# Patient Record
Sex: Male | Born: 1940 | Race: White | Hispanic: No | Marital: Married | Smoking: Former smoker
Health system: Southern US, Community
[De-identification: ages and names within clinical notes are randomized; demographics above are authoritative.]

## PROBLEM LIST (undated history)

## (undated) DIAGNOSIS — J302 Other seasonal allergic rhinitis: Secondary | ICD-10-CM

## (undated) DIAGNOSIS — I10 Essential (primary) hypertension: Secondary | ICD-10-CM

## (undated) DIAGNOSIS — M8580 Other specified disorders of bone density and structure, unspecified site: Secondary | ICD-10-CM

## (undated) DIAGNOSIS — E785 Hyperlipidemia, unspecified: Secondary | ICD-10-CM

## (undated) DIAGNOSIS — Z87442 Personal history of urinary calculi: Secondary | ICD-10-CM

## (undated) DIAGNOSIS — I219 Acute myocardial infarction, unspecified: Secondary | ICD-10-CM

## (undated) DIAGNOSIS — Z136 Encounter for screening for cardiovascular disorders: Secondary | ICD-10-CM

## (undated) DIAGNOSIS — M1990 Unspecified osteoarthritis, unspecified site: Secondary | ICD-10-CM

## (undated) DIAGNOSIS — I480 Paroxysmal atrial fibrillation: Secondary | ICD-10-CM

## (undated) DIAGNOSIS — I2510 Atherosclerotic heart disease of native coronary artery without angina pectoris: Secondary | ICD-10-CM

## (undated) HISTORY — DX: Other seasonal allergic rhinitis: J30.2

## (undated) HISTORY — DX: Unspecified osteoarthritis, unspecified site: M19.90

## (undated) HISTORY — DX: Other specified disorders of bone density and structure, unspecified site: M85.80

## (undated) HISTORY — DX: Essential (primary) hypertension: I10

## (undated) HISTORY — DX: Personal history of urinary calculi: Z87.442

## (undated) HISTORY — PX: CORONARY ANGIOPLASTY: SHX604

## (undated) HISTORY — DX: Atherosclerotic heart disease of native coronary artery without angina pectoris: I25.10

## (undated) HISTORY — PX: CARDIAC SURGERY: SHX584

## (undated) HISTORY — DX: Encounter for screening for cardiovascular disorders: Z13.6

## (undated) HISTORY — DX: Hyperlipidemia, unspecified: E78.5

---

## 2011-12-19 HISTORY — PX: CARDIAC SURGERY: SHX584

## 2013-02-25 LAB — CBC AND DIFFERENTIAL
HCT: 39 % — AB (ref 41–53)
HEMOGLOBIN: 13.1 g/dL — AB (ref 13.5–17.5)
Platelets: 211 10*3/uL (ref 150–399)
WBC: 9 10^3/mL

## 2013-02-25 LAB — LIPID PANEL
CHOLESTEROL: 107 mg/dL (ref 0–200)
HDL: 49 mg/dL (ref 35–70)
LDL Cholesterol: 47 mg/dL
LDL/HDL RATIO: 1
TRIGLYCERIDES: 53 mg/dL (ref 40–160)

## 2013-02-25 LAB — HEPATIC FUNCTION PANEL
ALK PHOS: 61 U/L (ref 25–125)
ALT: 14 U/L (ref 10–40)
AST: 15 U/L (ref 14–40)
Bilirubin, Total: 0.5 mg/dL

## 2013-02-25 LAB — BASIC METABOLIC PANEL
BUN: 26 mg/dL — AB (ref 4–21)
CREATININE: 1 mg/dL (ref 0.6–1.3)
Glucose: 91 mg/dL
POTASSIUM: 4.6 mmol/L (ref 3.4–5.3)
POTASSIUM: 4.6 mmol/L (ref 3.4–5.3)
SODIUM: 144 mmol/L (ref 137–147)

## 2014-01-28 DIAGNOSIS — Z9861 Coronary angioplasty status: Secondary | ICD-10-CM | POA: Diagnosis not present

## 2014-01-28 DIAGNOSIS — I2510 Atherosclerotic heart disease of native coronary artery without angina pectoris: Secondary | ICD-10-CM | POA: Diagnosis not present

## 2014-01-28 DIAGNOSIS — E785 Hyperlipidemia, unspecified: Secondary | ICD-10-CM | POA: Diagnosis not present

## 2014-01-28 DIAGNOSIS — I10 Essential (primary) hypertension: Secondary | ICD-10-CM | POA: Diagnosis not present

## 2014-05-07 DIAGNOSIS — H9120 Sudden idiopathic hearing loss, unspecified ear: Secondary | ICD-10-CM | POA: Diagnosis not present

## 2014-05-07 DIAGNOSIS — J322 Chronic ethmoidal sinusitis: Secondary | ICD-10-CM | POA: Diagnosis not present

## 2014-05-07 DIAGNOSIS — H663X9 Other chronic suppurative otitis media, unspecified ear: Secondary | ICD-10-CM | POA: Diagnosis not present

## 2014-05-07 DIAGNOSIS — H60399 Other infective otitis externa, unspecified ear: Secondary | ICD-10-CM | POA: Diagnosis not present

## 2014-05-18 DIAGNOSIS — E785 Hyperlipidemia, unspecified: Secondary | ICD-10-CM | POA: Diagnosis not present

## 2014-05-18 DIAGNOSIS — I10 Essential (primary) hypertension: Secondary | ICD-10-CM | POA: Diagnosis not present

## 2014-05-18 DIAGNOSIS — I2510 Atherosclerotic heart disease of native coronary artery without angina pectoris: Secondary | ICD-10-CM | POA: Diagnosis not present

## 2014-05-18 DIAGNOSIS — H9190 Unspecified hearing loss, unspecified ear: Secondary | ICD-10-CM | POA: Diagnosis not present

## 2014-06-21 DIAGNOSIS — H01009 Unspecified blepharitis unspecified eye, unspecified eyelid: Secondary | ICD-10-CM | POA: Diagnosis not present

## 2014-06-21 DIAGNOSIS — H109 Unspecified conjunctivitis: Secondary | ICD-10-CM | POA: Diagnosis not present

## 2014-08-11 DIAGNOSIS — H01009 Unspecified blepharitis unspecified eye, unspecified eyelid: Secondary | ICD-10-CM | POA: Diagnosis not present

## 2014-10-01 DIAGNOSIS — H16211 Exposure keratoconjunctivitis, right eye: Secondary | ICD-10-CM | POA: Diagnosis not present

## 2014-10-01 DIAGNOSIS — H5203 Hypermetropia, bilateral: Secondary | ICD-10-CM | POA: Diagnosis not present

## 2014-10-01 DIAGNOSIS — H02221 Mechanical lagophthalmos right upper eyelid: Secondary | ICD-10-CM | POA: Diagnosis not present

## 2014-10-01 DIAGNOSIS — H2513 Age-related nuclear cataract, bilateral: Secondary | ICD-10-CM | POA: Diagnosis not present

## 2014-10-01 DIAGNOSIS — H524 Presbyopia: Secondary | ICD-10-CM | POA: Diagnosis not present

## 2014-10-01 DIAGNOSIS — H3531 Nonexudative age-related macular degeneration: Secondary | ICD-10-CM | POA: Diagnosis not present

## 2014-11-23 DIAGNOSIS — I10 Essential (primary) hypertension: Secondary | ICD-10-CM | POA: Diagnosis not present

## 2014-11-23 DIAGNOSIS — E785 Hyperlipidemia, unspecified: Secondary | ICD-10-CM | POA: Diagnosis not present

## 2014-11-23 DIAGNOSIS — I868 Varicose veins of other specified sites: Secondary | ICD-10-CM | POA: Diagnosis not present

## 2014-11-23 DIAGNOSIS — I2510 Atherosclerotic heart disease of native coronary artery without angina pectoris: Secondary | ICD-10-CM | POA: Diagnosis not present

## 2014-11-23 DIAGNOSIS — Z720 Tobacco use: Secondary | ICD-10-CM | POA: Diagnosis not present

## 2014-11-23 DIAGNOSIS — N200 Calculus of kidney: Secondary | ICD-10-CM | POA: Diagnosis not present

## 2014-11-23 DIAGNOSIS — Z23 Encounter for immunization: Secondary | ICD-10-CM | POA: Diagnosis not present

## 2015-04-01 DIAGNOSIS — Z9889 Other specified postprocedural states: Secondary | ICD-10-CM | POA: Diagnosis not present

## 2015-04-01 DIAGNOSIS — I10 Essential (primary) hypertension: Secondary | ICD-10-CM | POA: Diagnosis not present

## 2015-04-01 DIAGNOSIS — Z720 Tobacco use: Secondary | ICD-10-CM | POA: Diagnosis not present

## 2015-04-01 DIAGNOSIS — I2510 Atherosclerotic heart disease of native coronary artery without angina pectoris: Secondary | ICD-10-CM | POA: Diagnosis not present

## 2015-04-01 DIAGNOSIS — E785 Hyperlipidemia, unspecified: Secondary | ICD-10-CM | POA: Diagnosis not present

## 2015-05-24 DIAGNOSIS — I2510 Atherosclerotic heart disease of native coronary artery without angina pectoris: Secondary | ICD-10-CM | POA: Diagnosis not present

## 2015-05-24 DIAGNOSIS — Z720 Tobacco use: Secondary | ICD-10-CM | POA: Diagnosis not present

## 2015-05-24 DIAGNOSIS — I10 Essential (primary) hypertension: Secondary | ICD-10-CM | POA: Diagnosis not present

## 2015-05-24 DIAGNOSIS — E785 Hyperlipidemia, unspecified: Secondary | ICD-10-CM | POA: Diagnosis not present

## 2015-06-11 DIAGNOSIS — Z136 Encounter for screening for cardiovascular disorders: Secondary | ICD-10-CM | POA: Diagnosis not present

## 2015-06-11 DIAGNOSIS — Z720 Tobacco use: Secondary | ICD-10-CM | POA: Diagnosis not present

## 2015-07-30 DIAGNOSIS — J320 Chronic maxillary sinusitis: Secondary | ICD-10-CM | POA: Diagnosis not present

## 2015-07-30 DIAGNOSIS — H903 Sensorineural hearing loss, bilateral: Secondary | ICD-10-CM | POA: Diagnosis not present

## 2015-07-30 DIAGNOSIS — J322 Chronic ethmoidal sinusitis: Secondary | ICD-10-CM | POA: Diagnosis not present

## 2015-07-30 DIAGNOSIS — H6983 Other specified disorders of Eustachian tube, bilateral: Secondary | ICD-10-CM | POA: Diagnosis not present

## 2015-09-07 ENCOUNTER — Ambulatory Visit (INDEPENDENT_AMBULATORY_CARE_PROVIDER_SITE_OTHER): Payer: Medicare Other | Admitting: Osteopathic Medicine

## 2015-09-07 ENCOUNTER — Encounter: Payer: Self-pay | Admitting: Osteopathic Medicine

## 2015-09-07 VITALS — BP 139/73 | HR 58 | Ht 74.0 in | Wt 235.0 lb

## 2015-09-07 DIAGNOSIS — J302 Other seasonal allergic rhinitis: Secondary | ICD-10-CM | POA: Insufficient documentation

## 2015-09-07 DIAGNOSIS — E785 Hyperlipidemia, unspecified: Secondary | ICD-10-CM

## 2015-09-07 DIAGNOSIS — I2584 Coronary atherosclerosis due to calcified coronary lesion: Secondary | ICD-10-CM

## 2015-09-07 DIAGNOSIS — I10 Essential (primary) hypertension: Secondary | ICD-10-CM

## 2015-09-07 DIAGNOSIS — I2510 Atherosclerotic heart disease of native coronary artery without angina pectoris: Secondary | ICD-10-CM | POA: Insufficient documentation

## 2015-09-07 DIAGNOSIS — M1990 Unspecified osteoarthritis, unspecified site: Secondary | ICD-10-CM

## 2015-09-07 DIAGNOSIS — R5382 Chronic fatigue, unspecified: Secondary | ICD-10-CM

## 2015-09-07 HISTORY — DX: Other seasonal allergic rhinitis: J30.2

## 2015-09-07 HISTORY — DX: Essential (primary) hypertension: I10

## 2015-09-07 HISTORY — DX: Hyperlipidemia, unspecified: E78.5

## 2015-09-07 HISTORY — DX: Atherosclerotic heart disease of native coronary artery without angina pectoris: I25.10

## 2015-09-07 LAB — COMPLETE METABOLIC PANEL WITH GFR
ALT: 10 U/L (ref 9–46)
AST: 14 U/L (ref 10–35)
Albumin: 3.9 g/dL (ref 3.6–5.1)
Alkaline Phosphatase: 82 U/L (ref 40–115)
BUN: 19 mg/dL (ref 7–25)
CALCIUM: 9.2 mg/dL (ref 8.6–10.3)
CHLORIDE: 106 mmol/L (ref 98–110)
CO2: 27 mmol/L (ref 20–31)
Creat: 0.9 mg/dL (ref 0.70–1.18)
GFR, Est Non African American: 83 mL/min (ref >=60)
Glucose, Bld: 88 mg/dL (ref 65–99)
POTASSIUM: 5.4 mmol/L — AB (ref 3.5–5.3)
Sodium: 143 mmol/L (ref 135–146)
Total Bilirubin: 0.5 mg/dL (ref 0.2–1.2)
Total Protein: 6.1 g/dL (ref 6.1–8.1)

## 2015-09-07 LAB — CBC WITH DIFFERENTIAL/PLATELET
Basophils Absolute: 0.1 10*3/uL (ref 0.0–0.1)
Basophils Relative: 1 % (ref 0–1)
EOS ABS: 0.2 10*3/uL (ref 0.0–0.7)
EOS PCT: 3 % (ref 0–5)
HEMATOCRIT: 40.6 % (ref 39.0–52.0)
Hemoglobin: 13.4 g/dL (ref 13.0–17.0)
LYMPHS PCT: 46 % (ref 12–46)
Lymphs Abs: 3.3 10*3/uL (ref 0.7–4.0)
MCH: 30.6 pg (ref 26.0–34.0)
MCHC: 33 g/dL (ref 30.0–36.0)
MCV: 92.7 fL (ref 78.0–100.0)
MONO ABS: 0.7 10*3/uL (ref 0.1–1.0)
MPV: 10.3 fL (ref 8.6–12.4)
Monocytes Relative: 10 % (ref 3–12)
Neutro Abs: 2.8 10*3/uL (ref 1.7–7.7)
Neutrophils Relative %: 40 % — ABNORMAL LOW (ref 43–77)
PLATELETS: 220 10*3/uL (ref 150–400)
RBC: 4.38 MIL/uL (ref 4.22–5.81)
RDW: 14.2 % (ref 11.5–15.5)
WBC: 7.1 10*3/uL (ref 4.0–10.5)

## 2015-09-07 LAB — TSH: TSH: 2.098 u[IU]/mL (ref 0.350–4.500)

## 2015-09-07 LAB — LIPID PANEL
CHOL/HDL RATIO: 2.6 ratio (ref <=5.0)
CHOLESTEROL: 90 mg/dL — AB (ref 125–200)
HDL: 35 mg/dL — ABNORMAL LOW (ref >=40)
LDL CALC: 39 mg/dL (ref <130)
TRIGLYCERIDES: 80 mg/dL (ref <150)
VLDL: 16 mg/dL (ref <30)

## 2015-09-07 NOTE — Progress Notes (Signed)
HPI: Timothy [name] is a 74 y.o. male who presents to [place]  today for chief complaint of:  Chief Complaint  Patient presents with  . Establish Care    chornic medical problems   recently moved to the area, here to establish care for chronic medical problems as noted below.  CAD - Hx MI, on appropriate medical therapy, had PCI procedure x2, no CP/SOB  MSK - Wife reports he sleeps in a recliner due to hip pain, she is also concerned about occasional apneic episodes. The patient is not particularly bothered by this, discussed possible referral to sports medicine for joint injection, he states his previous physician was recommending some sort of surgery and the SI joints. Patient is not interested in pursuing an injection or other therapy/workup at that time.   Preventive care reviewed as below   Past medical, social and family history reviewed: History reviewed. No pertinent past medical history. Past Surgical History  Procedure Laterality Date  . Cardiac surgery Bilateral     2 stints 1- left side  & 1 right side   Social History  Substance Use Topics  . Smoking status: Former Smoker    Start date: 11/16/2012  . Smokeless tobacco: Not on file  . Alcohol Use: No   History reviewed. No pertinent family history.  Current Outpatient Prescriptions  Medication Sig Dispense Refill  . aspirin 81 MG tablet Take 81 mg by mouth daily.    Marland Kitchen atorvastatin (LIPITOR) 10 MG tablet Take 10 mg by mouth daily.    Marland Kitchen levocetirizine (XYZAL) 5 MG tablet Take 5 mg by mouth daily.    Marland Kitchen lisinopril (PRINIVIL,ZESTRIL) 10 MG tablet Take 10 mg by mouth daily.    . metoprolol succinate (TOPROL-XL) 25 MG 24 hr tablet Take 25 mg by mouth daily.    . montelukast (SINGULAIR) 10 MG tablet Take 10 mg by mouth daily.    . Multiple Vitamins-Minerals (AIRBORNE PO) Take by mouth daily.     No current facility-administered medications for this visit.   No Known Allergies     Review of Systems: CONSTITUTIONAL: Neg fever/chills, no unintentional weight changes HEAD/EYES/EARS/NOSE/THROAT: No headache/vision change or hearing change, no sore throat CARDIAC: No chest pain/pressure/palpitations, no orthopnea RESPIRATORY: No cough/shortness of breath/wheeze GASTROINTESTINAL: No nausea/vomiting/abdominal pain/blood in stool/diarrhea/constipation MUSCULOSKELETAL: No myalgia/arthralgia GENITOURINARY: No incontinence, No abnormal genital bleeding/discharge SKIN: No rash/wounds/concerning lesions HEM/ONC: No easy bruising/bleeding, no abnormal lymph node ENDOCRINE: No polyuria/polydipsia/polyphagia, no heat/cold intolerance  NEUROLOGIC: No weakness/dizzines/slurred speech PSYCHIATRIC: No concerns with depression/anxiety or sleep problems. His wife has been concerned about memory.   Exam:  BP 139/73 mmHg  Pulse 58  Ht _0  (1.88 m)  Wt 235 lb (106.595 kg)  BMI 30.16 kg/m2  SpO2 97% Constitutional: VSS, see above. General Appearance: alert, well-developed, well-nourished, NAD Eyes: Normal lids and conjunctive, non-icteric sclera, PERRLA Ears, Nose, Mouth, Throat: Normal external inspection ears/nares/mouth/lips/gums, Normal TM bilaterally, MMM, posterior pharynx without erythema/exudate Neck: No masses, trachea midline. No thyroid enlargement/tenderness/mass appreciated Respiratory: Normal respiratory effort. no wheeze/rhonchi/rales Cardiovascular: S1/S2 normal, no murmur/rub/gallop auscultated. RRR. No carotid bruit or JVD. No abdominal aortic bruit. Pedal pulse II/IV bilaterally DP and PT. Trace lower extremity edema. Gastrointestinal: Nontender, no masses. No hepatomegaly, no splenomegaly. No hernia appreciated. Bowel sounds normal. Rectal exam deferred.  Musculoskeletal: Gait normal. No clubbing/cyanosis of digits.  Neurological: No cranial nerve deficit on limited exam. Motor and sensation intact and symmetric Psychiatric: Normal judgment/insight. Normal mood  and affect. Oriented  x3.    Results for orders placed or performed in visit on 09/07/15 (from the past 72 hour(s))  CBC with Differential/Platelet     Status: Abnormal   Collection Time: 09/07/15 11:08 AM  Result Value Ref Range   WBC 7.1 4.0 - 10.5 K/uL   RBC 4.38 4.22 - 5.81 MIL/uL   Hemoglobin 13.4 13.0 - 17.0 g/dL   HCT 40.6 39.0 - 52.0 %   MCV 92.7 78.0 - 100.0 fL   MCH 30.6 26.0 - 34.0 pg   MCHC 33.0 30.0 - 36.0 g/dL   RDW 14.2 11.5 - 15.5 %   Platelets 220 150 - 400 K/uL   MPV 10.3 8.6 - 12.4 fL   Neutrophils Relative % 40 (L) 43 - 77 %   Neutro Abs 2.8 1.7 - 7.7 K/uL   Lymphocytes Relative 46 12 - 46 %   Lymphs Abs 3.3 0.7 - 4.0 K/uL   Monocytes Relative 10 3 - 12 %   Monocytes Absolute 0.7 0.1 - 1.0 K/uL   Eosinophils Relative 3 0 - 5 %   Eosinophils Absolute 0.2 0.0 - 0.7 K/uL   Basophils Relative 1 0 - 1 %   Basophils Absolute 0.1 0.0 - 0.1 K/uL   Smear Review Criteria for review not met   COMPLETE METABOLIC PANEL WITH GFR     Status: Abnormal   Collection Time: 09/07/15 11:08 AM  Result Value Ref Range   Sodium 143 135 - 146 mmol/L   Potassium 5.4 (H) 3.5 - 5.3 mmol/L    Comment: No visible hemolysis.   Chloride 106 98 - 110 mmol/L   CO2 27 20 - 31 mmol/L   Glucose, Bld 88 65 - 99 mg/dL   BUN 19 7 - 25 mg/dL   Creat 0.90 0.70 - 1.18 mg/dL   Total Bilirubin 0.5 0.2 - 1.2 mg/dL   Alkaline Phosphatase 82 40 - 115 U/L   AST 14 10 - 35 U/L   ALT 10 9 - 46 U/L   Total Protein 6.1 6.1 - 8.1 g/dL   Albumin 3.9 3.6 - 5.1 g/dL   Calcium 9.2 8.6 - 10.3 mg/dL   GFR, Est African American >89 >=60 mL/min   GFR, Est Non African American 83 >=60 mL/min    Comment:   The estimated GFR is a calculation valid for adults (>=32 years old) that uses the CKD-EPI algorithm to adjust for age and sex. It is   not to be used for children, pregnant women, hospitalized patients,    patients on dialysis, or with rapidly changing kidney function. According to the NKDEP, eGFR >89 is  normal, 60-89 shows mild impairment, 30-59 shows moderate impairment, 15-29 shows severe impairment and <15 is ESRD.     Lipid panel     Status: Abnormal   Collection Time: 09/07/15 11:08 AM  Result Value Ref Range   Cholesterol 90 (L) 125 - 200 mg/dL   Triglycerides 80 <150 mg/dL   HDL 35 (L) >=40 mg/dL   Total CHOL/HDL Ratio 2.6 <=5.0 Ratio   VLDL 16 <30 mg/dL   LDL Cholesterol 39 <130 mg/dL    Comment:   Total Cholesterol/HDL Ratio:CHD Risk                        Coronary Heart Disease Risk Table  Men       Women          1/2 Average Risk              3.4        3.3              Average Risk              5.0        4.4           2X Average Risk              9.6        7.1           3X Average Risk             23.4       11.0 Use the calculated Patient Ratio above and the CHD Risk table  to determine the patient's CHD Risk.   TSH     Status: None   Collection Time: 09/07/15 11:08 AM  Result Value Ref Range   TSH 2.098 0.350 - 4.500 uIU/mL    Comment:   Footnotes:  (1) ** Please note change in unit of measure and reference range(s). **       Reviewed lab results from 2014, no concerns  ASSESSMENT/PLAN:   Essential hypertension - Plan: CBC with Differential/Platelet, COMPLETE METABOLIC PANEL WITH GFR, Lipid panel, TSH. Blood pressure control, patient is on appropriate secondary prevention for coronary artery disease  Seasonal allergies  Hyperlipidemia  Coronary artery disease due to calcified coronary lesion  Chronic fatigue  Arthritis - will review records, pt not interested in sports medicine referral at this time  Will address memory concerns at separate visit  See preventive care as below. Patient is not interested in screening for lung cancer. Patient may benefit from DEXA scan, wife does report history of fall, patient declines this as well. He states he has been screened for abdominal aortic aneurysm, states this was  normal. Await records. Patient also states that he already had his flu shot this year  [place] SCREENING/COUNSELING Tobacco - quit 2013, at least 30 pack year Alcohol - none Diet/Exercise - HEALTHY HABITS DISCUSSED TO DECREASE CV RISK Sexual Health - No STI - The patient denies history of sexually transmitted disease. INTERESTED IN STI TESTING - no Depression - PQH2 Negative Domestic violence concerns - no HTN SCREENING - SEE VITALS Vaccination status - SEE BELOW  INFECTIOUS DISEASE SCREENING HIV - all adults 15-65 - does not need GC/CT - sexually active - does not need HepC - born 58-1966 - does not need TB - if risk/required by employer - does not need  DISEASE SCREENING Lipid - (Low risk screen M35/F45; High risk screen M25/F35 if HTN, Tob, FH CHD M<55/F<65) - needs DM2 (45+ or Risk = FH 1st deg DM, Hx GDM, overweight/sedentary, high-risk ethnicity, HTN) - needs Osteoporosis - age 64+ or one sooner if risk - does not need  CANCER SCREENING Lung - annual low dose CT Chest age 39-75 w/ 30+ PY, current/quit past 15 years - CT - needs - declines Colon - age 79+ or 75 years of age prior to [place] Dx - GI REFERRAL - does not need, has had one 5 - 10 years ago, was normal AAA - had ultrasound, was negative  ADULT VACCINATION Influenza - annual - already has Td booster every 10 years - already has Zoster - age  50+ - already has Pneumonia - age 9+ sooner if risk (DM, smoker, other) - already has  OTHER Fall - exercise and Vit D age 68+ - does not need Consider ASA - age 77-59 - needs, already on this

## 2015-09-07 NOTE — Patient Instructions (Signed)
Please call us if you have not heard back in the next week or two about Dr Sheppard Coil reviewing your records.

## 2015-09-08 DIAGNOSIS — M1990 Unspecified osteoarthritis, unspecified site: Secondary | ICD-10-CM | POA: Insufficient documentation

## 2015-09-08 HISTORY — DX: Unspecified osteoarthritis, unspecified site: M19.90

## 2015-09-12 DIAGNOSIS — Z87891 Personal history of nicotine dependence: Secondary | ICD-10-CM | POA: Diagnosis not present

## 2015-09-12 DIAGNOSIS — I252 Old myocardial infarction: Secondary | ICD-10-CM | POA: Diagnosis not present

## 2015-09-12 DIAGNOSIS — S20222A Contusion of left back wall of thorax, initial encounter: Secondary | ICD-10-CM | POA: Diagnosis not present

## 2015-09-12 DIAGNOSIS — M549 Dorsalgia, unspecified: Secondary | ICD-10-CM | POA: Diagnosis not present

## 2015-09-12 DIAGNOSIS — N2889 Other specified disorders of kidney and ureter: Secondary | ICD-10-CM | POA: Diagnosis not present

## 2015-09-12 DIAGNOSIS — Z955 Presence of coronary angioplasty implant and graft: Secondary | ICD-10-CM | POA: Diagnosis not present

## 2015-09-12 DIAGNOSIS — H9190 Unspecified hearing loss, unspecified ear: Secondary | ICD-10-CM | POA: Diagnosis not present

## 2015-09-12 DIAGNOSIS — M479 Spondylosis, unspecified: Secondary | ICD-10-CM | POA: Diagnosis not present

## 2015-09-13 ENCOUNTER — Encounter: Payer: Self-pay | Admitting: Osteopathic Medicine

## 2015-09-13 DIAGNOSIS — M549 Dorsalgia, unspecified: Secondary | ICD-10-CM | POA: Diagnosis not present

## 2015-09-15 ENCOUNTER — Encounter: Payer: Self-pay | Admitting: Osteopathic Medicine

## 2015-09-15 ENCOUNTER — Ambulatory Visit (INDEPENDENT_AMBULATORY_CARE_PROVIDER_SITE_OTHER): Payer: Medicare Other | Admitting: Osteopathic Medicine

## 2015-09-15 VITALS — BP 110/55 | HR 63 | Resp 16 | Wt 235.0 lb

## 2015-09-15 DIAGNOSIS — I2584 Coronary atherosclerosis due to calcified coronary lesion: Secondary | ICD-10-CM | POA: Diagnosis not present

## 2015-09-15 DIAGNOSIS — R001 Bradycardia, unspecified: Secondary | ICD-10-CM | POA: Diagnosis not present

## 2015-09-15 DIAGNOSIS — Z8679 Personal history of other diseases of the circulatory system: Secondary | ICD-10-CM

## 2015-09-15 DIAGNOSIS — I2510 Atherosclerotic heart disease of native coronary artery without angina pectoris: Secondary | ICD-10-CM

## 2015-09-15 DIAGNOSIS — W19XXXA Unspecified fall, initial encounter: Secondary | ICD-10-CM | POA: Diagnosis not present

## 2015-09-15 DIAGNOSIS — Y92099 Unspecified place in other non-institutional residence as the place of occurrence of the external cause: Secondary | ICD-10-CM

## 2015-09-15 DIAGNOSIS — R296 Repeated falls: Secondary | ICD-10-CM

## 2015-09-15 DIAGNOSIS — Y92009 Unspecified place in unspecified non-institutional (private) residence as the place of occurrence of the external cause: Principal | ICD-10-CM

## 2015-09-15 DIAGNOSIS — Z9181 History of falling: Secondary | ICD-10-CM

## 2015-09-15 MED ORDER — CHOLECALCIFEROL 25 MCG (1000 UT) PO CHEW: 1000.0000 [IU] | CHEWABLE_TABLET | Freq: Every day | ORAL | Status: DC

## 2015-09-15 NOTE — Patient Instructions (Signed)
Bone Densitometry Bone densitometry is a special X-ray that measures your bone density and can be used to help predict your risk of bone fractures. This test is used to determine bone mineral content and density to diagnose osteoporosis. Osteoporosis is the loss of bone that may cause the bone to become weak. Osteoporosis commonly occurs in women entering menopause. However, it may be found in men and in people with other diseases. PREPARATION FOR TEST No preparation necessary. WHO SHOULD BE TESTED?  All women older than 53 and men older than 2.  Postmenopausal women (34 to 34) and men with risk factors for osteoporosis.  People with a previous fracture caused by normal activities.  People with a small body frame (less than 127 poundsor a body mass index [BMI] of less than 21).  People who have a parent with a hip fracture or history of osteoporosis.  People who smoke.  People who have rheumatoid arthritis.  Anyone who engages in excessive alcohol use (more than 3 drinks most days).  Women who experience early menopause. WHEN SHOULD YOU BE RETESTED? Current guidelines suggest that you should wait at least 2 years before doing a bone density test again if your first test was normal.Recent studies indicated that women with normal bone density may be able to wait a few years before needing to repeat a bone density test. You should discuss this with your caregiver.  NORMAL FINDINGS   Normal: less than standard deviation below normal (greater than -1).  Osteopenia: 1 to 2.5 standard deviations below normal (-1 to -2.5).  Osteoporosis: greater than 2.5 standard deviations below normal (less than -2.5). Test results are reported as a "T score" and a "Z score."The T score is a number that compares your bone density with the bone density of healthy, young women.The Z score is a number that compares your bone density with the scores of women who are the same age, gender, and race.   Ranges for normal findings may vary among different laboratories and hospitals. You should always check with your doctor after having lab work or other tests done to discuss the meaning of your test results and whether your values are considered within normal limits. MEANING OF TEST  Your caregiver will go over the test results with you and discuss the importance and meaning of your results, as well as treatment options and the need for additional tests if necessary. OBTAINING THE TEST RESULTS It is your responsibility to obtain your test results. Ask the lab or department performing the test when and how you will get your results. Document Released: 12/26/2004 Document Revised: 02/26/2012 Document Reviewed: 01/18/2011 [facility] Patient Information 2015 [place], [place]. This information is not intended to replace advice given to you by your health care provider. Make sure you discuss any questions you have with your health care provider.

## 2015-09-15 NOTE — Progress Notes (Signed)
HPI: Timothy [name] is a 74 y.o. male who presents to [place]  today for chief complaint of:  Chief Complaint  Patient presents with  . Follow-up    recently in ER s/p fall    . Location: L arm/shoulder . Quality: sore . Severity: mild to moderate . Duration: happened 9/24- or 9/25, went to ER 09/13/15 . Context: mechanical fall while trying to move a televisio nwith his wife, fell into wall, went to ER 2 - 3 days after the event and was told chest Xray was normal but wife has concern for possible "hidden fracture." Given pain meds in ER but not really taking these. Concern for arrhythmia around time he had heart attack, not sure which.  Past medical, social and family history reviewed: No past medical history on file. Past Surgical History  Procedure Laterality Date  . Cardiac surgery Bilateral     2 stints 1- left side  & 1 right side   Social History  Substance Use Topics  . Smoking status: Former Smoker    Start date: 11/16/2012  . Smokeless tobacco: Not on file  . Alcohol Use: No   No family history on file.  Current Outpatient Prescriptions  Medication Sig Dispense Refill  . aspirin 81 MG tablet Take 81 mg by mouth daily.    Marland Kitchen atorvastatin (LIPITOR) 10 MG tablet Take 10 mg by mouth daily.    Marland Kitchen levocetirizine (XYZAL) 5 MG tablet Take 5 mg by mouth daily.    Marland Kitchen lisinopril (PRINIVIL,ZESTRIL) 10 MG tablet Take 10 mg by mouth daily.    . metoprolol succinate (TOPROL-XL) 25 MG 24 hr tablet Take 25 mg by mouth daily.    . montelukast (SINGULAIR) 10 MG tablet Take 10 mg by mouth daily.    . Multiple Vitamins-Minerals (AIRBORNE PO) Take by mouth daily.     No current facility-administered medications for this visit.   No Known Allergies    Review of Systems: CONSTITUTIONAL: Neg fever/chills, no unintentional weight changes HEAD/EYES/EARS/NOSE/THROAT: No headache/vision change or hearing change, no sore throat CARDIAC: No chest  pain/pressure/palpitations, no orthopnea RESPIRATORY: No cough/shortness of breath/wheeze GASTROINTESTINAL: No nausea/vomiting/abdominal pain/blood in stool/diarrhea/constipation MUSCULOSKELETAL: (+) myalgia/arthralgia - L arm/rib pain where he fell into wall, no sensation loss or motor weakness SKIN: No rash/wounds/concerning lesions NEUROLOGIC: Occasional weakness/dizzines, no slurred speech/facial droop/unilateral weakness   Exam:  BP 110/55 mmHg  Pulse 63  Resp 16  Wt 235 lb (106.595 kg)  SpO2 96% Constitutional: VSS, see above. General Appearance: alert, well-developed, well-nourished, NAD Respiratory: Normal respiratory effort. no wheeze/rhonchi/rales Cardiovascular: S1/S2 normal, no murmur/rub/gallop auscultated. Reg rhythm, rate approx 60. NO clubbing/cyanosis of digits.  Neurological: No cranial nerve deficit on limited exam. Motor and sensation intact and symmetric Psychiatric: Normal judgment/insight. Normal mood and affect. Oriented x3.    No results found for this or any previous visit (from the past 72 hour(s)).  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    ASSESSMENT/PLAN:  Fall at home, initial encounter - Plan: DG Bone Density  At high risk for falls - Plan: DG Bone Density, Cholecalciferol ([employer] VITAMIN D3) 1000 UNITS CHEW  History of irregular heartbeat - Plan: EKG 12-Lead  Sinus bradycardia on ECG - Decrease Metoprolol, RTC 1 week nurse visit BP check

## 2015-09-20 ENCOUNTER — Encounter: Payer: Self-pay | Admitting: Osteopathic Medicine

## 2015-09-20 DIAGNOSIS — Z136 Encounter for screening for cardiovascular disorders: Secondary | ICD-10-CM

## 2015-09-20 DIAGNOSIS — Z87442 Personal history of urinary calculi: Secondary | ICD-10-CM

## 2015-09-20 HISTORY — DX: Personal history of urinary calculi: Z87.442

## 2015-09-20 HISTORY — DX: Encounter for screening for cardiovascular disorders: Z13.6

## 2015-09-22 ENCOUNTER — Ambulatory Visit: Payer: Medicare Other

## 2015-10-06 ENCOUNTER — Encounter: Payer: Self-pay | Admitting: Osteopathic Medicine

## 2015-10-06 ENCOUNTER — Ambulatory Visit (INDEPENDENT_AMBULATORY_CARE_PROVIDER_SITE_OTHER): Payer: Medicare Other | Admitting: Osteopathic Medicine

## 2015-10-06 VITALS — BP 126/78 | HR 68 | Wt 230.0 lb

## 2015-10-06 DIAGNOSIS — I2510 Atherosclerotic heart disease of native coronary artery without angina pectoris: Secondary | ICD-10-CM | POA: Diagnosis not present

## 2015-10-06 DIAGNOSIS — R413 Other amnesia: Secondary | ICD-10-CM | POA: Diagnosis not present

## 2015-10-06 DIAGNOSIS — R42 Dizziness and giddiness: Secondary | ICD-10-CM

## 2015-10-06 DIAGNOSIS — I9589 Other hypotension: Secondary | ICD-10-CM | POA: Diagnosis not present

## 2015-10-06 DIAGNOSIS — Z9181 History of falling: Secondary | ICD-10-CM

## 2015-10-06 DIAGNOSIS — R296 Repeated falls: Secondary | ICD-10-CM | POA: Diagnosis not present

## 2015-10-06 DIAGNOSIS — I2584 Coronary atherosclerosis due to calcified coronary lesion: Secondary | ICD-10-CM | POA: Diagnosis not present

## 2015-10-06 MED ORDER — CHOLECALCIFEROL 25 MCG (1000 UT) PO CHEW: 1000.0000 [IU] | CHEWABLE_TABLET | Freq: Every day | ORAL | Status: DC

## 2015-10-06 NOTE — Progress Notes (Signed)
HPI: Timothy [name] is a 74 y.o. male who presents to [facility]  today for chief complaint of:  Chief Complaint  Patient presents with  . Follow-up    dizziness   DIZZINESS: Hypotension at home, 90/50 on home monitor was lowest and he was feeling dizzy from this, felt better after drinking some water and eating. 109/60s also according to his wife. Wife reports he was feeling weak all day and staying in his chair. No CP/SOB, no falls.   MEMORY: Wife is concerned about memory problems, states he is being stubborn about doing ADL's such as showering, laundry. Per son, he left car running once, not leaving stove on or wandering away. No personality changes or mood swings, no agitation/violence. Didn't remember why we were ordering a bone scan, despite extensive discussion last visit.    Past medical, social and family history reviewed: Past Medical History  Diagnosis Date  . History of kidney stones 09/20/2015    11/2013 7mm stone  . Screening for AAA (aortic abdominal aneurysm) 09/20/2015    Neg 06/11/15 per record review   Past Surgical History  Procedure Laterality Date  . Cardiac surgery Bilateral     2 stints 1- left side  & 1 right side   Social History  Substance Use Topics  . Smoking status: Former Smoker    Start date: 11/16/2012  . Smokeless tobacco: Not on file  . Alcohol Use: No   No family history on file.  Current Outpatient Prescriptions  Medication Sig Dispense Refill  . aspirin 81 MG tablet Take 81 mg by mouth daily.    Marland Kitchen atorvastatin (LIPITOR) 10 MG tablet Take 10 mg by mouth daily.    Marland Kitchen levocetirizine (XYZAL) 5 MG tablet Take 5 mg by mouth daily.    Marland Kitchen lisinopril (PRINIVIL,ZESTRIL) 10 MG tablet Take 10 mg by mouth daily.    . metoprolol succinate (TOPROL-XL) 25 MG 24 hr tablet Take 12.5 mg by mouth daily. (Take 1/2 tab daily)    . Multiple Vitamins-Minerals (AIRBORNE PO) Take by mouth daily.    . montelukast (SINGULAIR) 10 MG  tablet Take 10 mg by mouth daily.     No current facility-administered medications for this visit.   No Known Allergies    Review of Systems: CONSTITUTIONAL: Neg fever/chills, no unintentional weight changes HEAD/EYES/EARS/NOSE/THROAT: No headache/vision change or hearing change CARDIAC: No chest pain/pressure/palpitations, no orthopnea RESPIRATORY: No cough/shortness of breath/wheeze NEUROLOGIC: (+) weakness/dizzines as per HPI, no slurred speech PSYCHIATRIC: No concerns with depression/anxiety or sleep problems    Exam:  BP 126/78 mmHg  Pulse 68  Wt 230 lb (104.327 kg)  SpO2 94% Constitutional: VSS, see above. General Appearance: alert, well-developed, well-nourished, NAD Respiratory: Normal respiratory effort. no wheeze/rhonchi/rales Cardiovascular: S1/S2 normal, no murmur/rub/gallop auscultated. RRR.  Neurological: No cranial nerve deficit on limited exam. Motor and sensation intact and symmetric. MMSE normal.  Psychiatric: Normal judgment/insight. Normal mood and affect. Oriented x3.    No results found for this or any previous visit (from the past 72 hour(s)).    ASSESSMENT/PLAN:  Dizziness most likely due to dehydration, possible overmedication (though needs BB and ACE for cardioprotection if can tolerate)  Other specified hypotension  Memory difficulty  At high risk for falls - Plan: Cholecalciferol ([employer]S VITAMIN D3) 1000 UNITS CHEW   Decrease Lisinopril to 5mg  daily.  RTC BP check 1 - 2 weeks with nurse.  Reordered Vitamin D, pt to keep appt for bone scan.   Memory  issues, I suspect he doesn't pay attention or isn't interested in certain things. MMSE normal (perfect score), consider MoCA if wife is still concerned. Stubborn personality but no concern at this time for overt dementia. Will follow closely and consider repeat testing.

## 2015-10-06 NOTE — Patient Instructions (Signed)
Metoprolol and Lisinopril should be cut in half! Fill prescription for Vitamin D. Please call us if you haven't heard about scheduling the bone scan by the end of this week.  Any concerns for memory or behavior, please schedule an appointment so we can complete more extensive testing!

## 2015-10-21 ENCOUNTER — Ambulatory Visit (INDEPENDENT_AMBULATORY_CARE_PROVIDER_SITE_OTHER): Payer: Medicare Other

## 2015-10-21 DIAGNOSIS — Z9181 History of falling: Secondary | ICD-10-CM

## 2015-10-21 DIAGNOSIS — M8580 Other specified disorders of bone density and structure, unspecified site: Secondary | ICD-10-CM | POA: Diagnosis not present

## 2015-10-21 DIAGNOSIS — Y92009 Unspecified place in unspecified non-institutional (private) residence as the place of occurrence of the external cause: Secondary | ICD-10-CM

## 2015-10-21 DIAGNOSIS — M810 Age-related osteoporosis without current pathological fracture: Secondary | ICD-10-CM | POA: Diagnosis not present

## 2015-10-21 DIAGNOSIS — W19XXXA Unspecified fall, initial encounter: Secondary | ICD-10-CM

## 2015-10-29 DIAGNOSIS — M8580 Other specified disorders of bone density and structure, unspecified site: Secondary | ICD-10-CM | POA: Insufficient documentation

## 2015-10-29 HISTORY — DX: Other specified disorders of bone density and structure, unspecified site: M85.80

## 2015-10-29 MED ORDER — CALCIUM CARBONATE-VITAMIN D 600-400 MG-UNIT PO CHEW: 2.0000 | CHEWABLE_TABLET | Freq: Every day | ORAL | Status: DC

## 2015-12-07 ENCOUNTER — Ambulatory Visit (INDEPENDENT_AMBULATORY_CARE_PROVIDER_SITE_OTHER): Payer: Medicare Other | Admitting: Osteopathic Medicine

## 2015-12-07 ENCOUNTER — Encounter: Payer: Self-pay | Admitting: Osteopathic Medicine

## 2015-12-07 VITALS — BP 131/62 | HR 72 | Ht 74.0 in | Wt 232.0 lb

## 2015-12-07 DIAGNOSIS — I2584 Coronary atherosclerosis due to calcified coronary lesion: Secondary | ICD-10-CM | POA: Diagnosis not present

## 2015-12-07 DIAGNOSIS — I2510 Atherosclerotic heart disease of native coronary artery without angina pectoris: Secondary | ICD-10-CM

## 2015-12-07 DIAGNOSIS — I10 Essential (primary) hypertension: Secondary | ICD-10-CM

## 2015-12-07 DIAGNOSIS — M8580 Other specified disorders of bone density and structure, unspecified site: Secondary | ICD-10-CM | POA: Diagnosis not present

## 2015-12-07 NOTE — Progress Notes (Signed)
HPI: Timothy [name] is a 74 y.o. male who presents to [facility] today for chief complaint of:  Chief Complaint  Patient presents with  . Follow-up    blood pressure   HTN: 131/62 today,  Was on Metoprolol 25 and Lisinopril 10 last visit cut these doses due to low BP and dizziness at home, reported BP in 90s but in office 126/78 HR 68. Hx CAD so BB and ACE indicated.  States he is taking 1/2 Lisinopril maybe 3/4 of the time, Metoprolol is easier to cut in half so he is a bit more consistent about this.   OSTEOPENIA - Pt has questions about bone scan, see med Hx below, we reviewed that he needs to be on Ca+D which he is taking for about 2 weeks now. He is not interested in bisphosphonate therapy at this time.    Past medical, social and family history reviewed: Past Medical History  Diagnosis Date  . History of kidney stones 09/20/2015    11/2013 46mm stone  . Screening for AAA (aortic abdominal aneurysm) 09/20/2015    Neg 06/11/15 per record review  . Essential hypertension 09/07/2015  . CAD (coronary artery disease) 09/07/2015    S/P RCA stent x2, Hx MI, (+)ACEI, Statin, BB, ASA - 10/2012   . Seasonal allergies 09/07/2015  . Osteopenia 10/29/2015    FRAX Score: 10 year risk of major osteoporotic fracture 8.9%, hip fracture 3.2%, prescription treatment is indicated, called in VitD/Ca, +/- bisphosphanate   . Hyperlipidemia 09/07/2015  . Arthritis 09/08/2015   Past Surgical History  Procedure Laterality Date  . Cardiac surgery Bilateral     2 stints 1- left side  & 1 right side   Social History  Substance Use Topics  . Smoking status: Former Smoker    Start date: 11/16/2012  . Smokeless tobacco: Not on file  . Alcohol Use: No   No family history on file.  Current Outpatient Prescriptions  Medication Sig Dispense Refill  . aspirin 81 MG tablet Take 81 mg by mouth daily.    Marland Kitchen atorvastatin (LIPITOR) 10 MG tablet Take 10 mg by mouth daily.    . Calcium  Carbonate-Vitamin D 600-400 MG-UNIT chew tablet Chew 2 tablets by mouth daily. 60 tablet 3  . levocetirizine (XYZAL) 5 MG tablet Take 5 mg by mouth daily.    Marland Kitchen lisinopril (PRINIVIL,ZESTRIL) 10 MG tablet Take 5 mg by mouth daily.    . metoprolol succinate (TOPROL-XL) 25 MG 24 hr tablet Take 12.5 mg by mouth daily. (Take 1/2 tab daily)    . montelukast (SINGULAIR) 10 MG tablet Take 10 mg by mouth daily.    . Multiple Vitamins-Minerals (AIRBORNE PO) Take by mouth daily.     No current facility-administered medications for this visit.   No Known Allergies    Review of Systems: CONSTITUTIONAL:  No  fever, no chills, No  unintentional weight changes HEAD/EYES/EARS/NOSE/THROAT: No  headache, no vision change, no hearing change but (+) hard of hearing, No  sore throat, No  sinus pressure CARDIAC: No  chest pain, No  pressure, No palpitations, No  orthopnea RESPIRATORY: No  cough, No  shortness of breath/wheeze NEUROLOGIC: No  weakness, No  dizziness, No  slurred speech     Exam:  BP 131/62 mmHg  Pulse 72  Ht 6\' 2"  (1.88 m)  Wt 232 lb (105.235 kg)  BMI 29.77 kg/m2 Constitutional: VS see above. General Appearance: alert, well-developed, well-nourished, NAD Respiratory: Normal respiratory effort.  no wheeze, no rhonchi, no rales Cardiovascular: S1/S2 normal, no murmur, no rub/gallop auscultated. RRR.  Musculoskeletal: Gait normal. No clubbing/cyanosis of digits.  Neurological: No cranial nerve deficit on limited exam. Motor intact and symmetric Psychiatric: Normal judgment/insight. Normal mood and affect. Oriented x3.    No results found for this or any previous visit (from the past 72 hour(s)).    ASSESSMENT/PLAN:  Essential hypertension - controlle,d no dizziness problems or falls, continue current low-dose meds  Osteopenia - cont Ca+D, pt declines bisphosphanate tx, will repeat DEXA 1 - 2 years   Return in about 6 months (around 06/06/2016) for [place].

## 2015-12-28 DIAGNOSIS — H35033 Hypertensive retinopathy, bilateral: Secondary | ICD-10-CM | POA: Diagnosis not present

## 2015-12-28 DIAGNOSIS — H5200 Hypermetropia, unspecified eye: Secondary | ICD-10-CM | POA: Diagnosis not present

## 2016-02-01 DIAGNOSIS — H04121 Dry eye syndrome of right lacrimal gland: Secondary | ICD-10-CM | POA: Diagnosis not present

## 2016-04-12 ENCOUNTER — Telehealth: Payer: Self-pay

## 2016-04-12 MED ORDER — LISINOPRIL 10 MG PO TABS: 5.0000 mg | ORAL_TABLET | Freq: Every day | ORAL | Status: DC

## 2016-04-12 NOTE — Telephone Encounter (Signed)
Patient walked in a request a refill for Lisinopril 10 mg. #[phone] refills was sent to [facility]. Cleatus Goodin,CMA

## 2016-06-06 ENCOUNTER — Ambulatory Visit (INDEPENDENT_AMBULATORY_CARE_PROVIDER_SITE_OTHER): Payer: Medicare Other

## 2016-06-06 ENCOUNTER — Ambulatory Visit (INDEPENDENT_AMBULATORY_CARE_PROVIDER_SITE_OTHER): Payer: Medicare Other | Admitting: Osteopathic Medicine

## 2016-06-06 ENCOUNTER — Encounter: Payer: Self-pay | Admitting: Osteopathic Medicine

## 2016-06-06 VITALS — BP 130/69 | HR 93 | Ht 74.0 in | Wt 234.0 lb

## 2016-06-06 DIAGNOSIS — J302 Other seasonal allergic rhinitis: Secondary | ICD-10-CM

## 2016-06-06 DIAGNOSIS — M8580 Other specified disorders of bone density and structure, unspecified site: Secondary | ICD-10-CM | POA: Diagnosis not present

## 2016-06-06 DIAGNOSIS — I10 Essential (primary) hypertension: Secondary | ICD-10-CM

## 2016-06-06 DIAGNOSIS — E785 Hyperlipidemia, unspecified: Secondary | ICD-10-CM

## 2016-06-06 DIAGNOSIS — M5136 Other intervertebral disc degeneration, lumbar region: Secondary | ICD-10-CM

## 2016-06-06 DIAGNOSIS — M545 Low back pain, unspecified: Secondary | ICD-10-CM

## 2016-06-06 DIAGNOSIS — M47896 Other spondylosis, lumbar region: Secondary | ICD-10-CM | POA: Diagnosis not present

## 2016-06-06 DIAGNOSIS — I2584 Coronary atherosclerosis due to calcified coronary lesion: Secondary | ICD-10-CM

## 2016-06-06 DIAGNOSIS — I2510 Atherosclerotic heart disease of native coronary artery without angina pectoris: Secondary | ICD-10-CM | POA: Diagnosis not present

## 2016-06-06 MED ORDER — MONTELUKAST SODIUM 10 MG PO TABS: 10.0000 mg | ORAL_TABLET | Freq: Every day | ORAL | Status: DC

## 2016-06-06 MED ORDER — LISINOPRIL 5 MG PO TABS
5.0000 mg | ORAL_TABLET | Freq: Every day | ORAL | Status: DC
Start: 2016-06-06 — End: 2016-06-06

## 2016-06-06 MED ORDER — CALCIUM CARBONATE-VITAMIN D 600-400 MG-UNIT PO CHEW: 2.0000 | CHEWABLE_TABLET | Freq: Every day | ORAL | Status: DC

## 2016-06-06 MED ORDER — ATORVASTATIN CALCIUM 10 MG PO TABS
10.0000 mg | ORAL_TABLET | Freq: Every day | ORAL | Status: DC
Start: 2016-06-06 — End: 2017-06-01

## 2016-06-06 MED ORDER — LISINOPRIL 5 MG PO TABS
5.0000 mg | ORAL_TABLET | Freq: Every day | ORAL | Status: DC
Start: 2016-06-06 — End: 2017-05-14

## 2016-06-06 MED ORDER — MONTELUKAST SODIUM 10 MG PO TABS
10.0000 mg | ORAL_TABLET | Freq: Every day | ORAL | Status: DC
Start: 2016-06-06 — End: 2017-06-01

## 2016-06-06 NOTE — Progress Notes (Signed)
HPI: Timothy [name] is a 75 y.o. male who presents to [facility] today for chief complaint of:  Chief Complaint  Patient presents with  . Follow-up    BLOOD PRESSURE  . Back Pain    LOWER; WHEN HE WALKS 2-3 BLOCKS IT STARTS TO HURT     HYPERTENSION - 131/62 at last visit 6 months ago, Was on Metoprolol 25 and Lisinopril 10 prior to that but we cut these doses due to low BP and dizziness at home, reported BP in 90s but in office was 126/78 HR 68. Today BP stable as below at 130/69. Hx CAD so BB and ACE indicated. States he is taking 1/2 Lisinopril maybe 3/4 of the time, Metoprolol was easier to cut in half so he was a bit more consistent about this.   BACK PAIN  . Location: lower back on right side  . Quality: soreness   . Timing: worse with walking but then relieved with rest . Modifying factors: (+) osteopenia with FRAX score to indicate bisphosphonate tx but pt declines, no injury to back. No previous imaging available. Previously went to chiropractor.  . Assoc signs/symptoms: no numbness or radicular pain, no claudication     Past medical, social and family history reviewed: Past Medical History  Diagnosis Date  . History of kidney stones 09/20/2015    11/2013 81mm stone  . Screening for AAA (aortic abdominal aneurysm) 09/20/2015    Neg 06/11/15 per record review  . Essential hypertension 09/07/2015  . CAD (coronary artery disease) 09/07/2015    S/P RCA stent x2, Hx MI, (+)ACEI, Statin, BB, ASA - 10/2012   . Seasonal allergies 09/07/2015  . Osteopenia 10/29/2015    FRAX Score: 10 year risk of major osteoporotic fracture 8.9%, hip fracture 3.2%, prescription treatment is indicated, called in VitD/Ca, +/- bisphosphanate   . Hyperlipidemia 09/07/2015  . Arthritis 09/08/2015   Past Surgical History  Procedure Laterality Date  . Cardiac surgery Bilateral     2 stints 1- left side  & 1 right side   Social History  Substance Use Topics  . Smoking  status: Former Smoker    Start date: 11/16/2012  . Smokeless tobacco: Not on file  . Alcohol Use: No   No family history on file.  Current Outpatient Prescriptions  Medication Sig Dispense Refill  . aspirin 81 MG tablet Take 81 mg by mouth daily.    Marland Kitchen atorvastatin (LIPITOR) 10 MG tablet Take 10 mg by mouth daily.    . Calcium Carbonate-Vitamin D 600-400 MG-UNIT chew tablet Chew 2 tablets by mouth daily. 60 tablet 3  . levocetirizine (XYZAL) 5 MG tablet Take 5 mg by mouth daily.    Marland Kitchen lisinopril (PRINIVIL,ZESTRIL) 10 MG tablet Take 0.5 tablets (5 mg total) by mouth daily. 90 tablet 0  . metoprolol succinate (TOPROL-XL) 25 MG 24 hr tablet Take 12.5 mg by mouth daily. (Take 1/2 tab daily)    . montelukast (SINGULAIR) 10 MG tablet Take 10 mg by mouth daily.    . Multiple Vitamins-Minerals (AIRBORNE PO) Take by mouth daily.     No current facility-administered medications for this visit.   No Known Allergies    Review of Systems: CONSTITUTIONAL:  No  fever, no chills, No recent illness HEAD/EYES/EARS/NOSE/THROAT: No  headache, no vision change, (+) HOH CARDIAC: No  chest pain, No  pressure, No palpitations RESPIRATORY: No  cough, No  shortness of breath MUSCULOSKELETAL: (+) myalgia/arthralgia as per hPI NEUROLOGIC: No  weakness  Exam:  BP 130/69 mmHg  Pulse 93  Ht 6\' 2"  (1.88 m)  Wt 234 lb (106.142 kg)  BMI 30.03 kg/m2 Constitutional: VS see above. General Appearance: alert, well-developed, well-nourished, NAD Eyes: Normal lids and conjunctive, non-icteric sclera Ears, Nose, Mouth, Throat: MMM,  Respiratory: Normal respiratory effort. no wheeze, no rhonchi, no rales Cardiovascular: S1/S2 normal, no murmur, no rub/gallop auscultated. RRR.No lower extremity edema. Gastrointestinal: Nontender, no masses.   Musculoskeletal: Gait normal. No clubbing/cyanosis of digits. (+) perilumbar tenderness R side quadratus lumborum,  No radicular pain, unable to assess trendelenberg - pt feels  unsteady both sides when tries to balance on one leg Neurological: No cranial nerve deficit on limited exam. Motor and sensation intact and symmetric Skin: warm, dry, intact. No rash/ulcer. No concerning nevi or subq nodules on limited exam.   Psychiatric: Normal judgment/insight. Normal mood and affect. Oriented x3.    No results found for this or any previous visit (from the past 72 hour(s)).  Dg Lumbar Spine Complete  06/06/2016  CLINICAL DATA:  Right-sided low back pain a few weeks radiating to right SI joint. EXAM: LUMBAR SPINE - COMPLETE 4+ VIEW COMPARISON:  None. FINDINGS: Vertebral body alignment and heights are normal. There is moderate spondylosis throughout the lumbar spine to include facet arthropathy over the lower lumbar spine. There is moderate disc space narrowing at the L4-5 level and to lesser extent at the L1-2, L2-3 and L5-S1 levels. No compression fracture or spondylolisthesis. Calcified plaque over the abdominal aorta and iliac arteries. 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 Aortic atherosclerosis. Electronically Signed   By: Marin Olp M.D.   On: 06/06/2016 10:51     ASSESSMENT/PLAN:  Essential hypertension - Plan: lisinopril (PRINIVIL,ZESTRIL) 5 MG tablet, DISCONTINUED: lisinopril (PRINIVIL,ZESTRIL) 5 MG tablet  Coronary artery disease due to calcified coronary lesion  Right-sided low back pain without sciatica - Pt declined PT referral - call if changes his mind, XR no displacement will consider MRI of no better after PT tried - Plan: DG Lumbar Spine Complete  Osteopenia - FRAX Score: 10 year risk of major osteoporotic fracture 8.9%, hip fracture 3.2%, prescription treatment is indicated, called in VitD/Ca, +/- bisphosphanate - Plan: Calcium Carbonate-Vitamin D 600-400 MG-UNIT chew tablet, DISCONTINUED: Calcium Carbonate-Vitamin D 600-400 MG-UNIT chew tablet  Seasonal allergies - Plan: montelukast  (SINGULAIR) 10 MG tablet, DISCONTINUED: montelukast (SINGULAIR) 10 MG tablet  Hyperlipidemia - Plan: atorvastatin (LIPITOR) 10 MG tablet     All questions were answered. Visit summary with medication list and pertinent instructions was printed for patient to review. ER/RTC precautions were reviewed with the patient. Return in about 6 months (around 12/06/2016), or sooner if needed, for [facility], LABS (call 1 week before appointment if you want labs done before your visit).

## 2016-11-29 ENCOUNTER — Other Ambulatory Visit: Payer: Self-pay | Admitting: Osteopathic Medicine

## 2016-11-29 DIAGNOSIS — I10 Essential (primary) hypertension: Secondary | ICD-10-CM

## 2016-11-29 DIAGNOSIS — E785 Hyperlipidemia, unspecified: Secondary | ICD-10-CM

## 2016-11-29 DIAGNOSIS — Z0000 Encounter for general adult medical examination without abnormal findings: Secondary | ICD-10-CM

## 2016-11-29 DIAGNOSIS — Z1329 Encounter for screening for other suspected endocrine disorder: Secondary | ICD-10-CM

## 2016-11-30 DIAGNOSIS — I10 Essential (primary) hypertension: Secondary | ICD-10-CM | POA: Diagnosis not present

## 2016-11-30 DIAGNOSIS — E785 Hyperlipidemia, unspecified: Secondary | ICD-10-CM | POA: Diagnosis not present

## 2016-11-30 DIAGNOSIS — Z0000 Encounter for general adult medical examination without abnormal findings: Secondary | ICD-10-CM | POA: Diagnosis not present

## 2016-12-01 LAB — COMPLETE METABOLIC PANEL WITH GFR
ALT: 10 U/L (ref 9–46)
AST: 15 U/L (ref 10–35)
Albumin: 4 g/dL (ref 3.6–5.1)
Alkaline Phosphatase: 58 U/L (ref 40–115)
BILIRUBIN TOTAL: 0.5 mg/dL (ref 0.2–1.2)
BUN: 26 mg/dL — ABNORMAL HIGH (ref 7–25)
CALCIUM: 9.4 mg/dL (ref 8.6–10.3)
CO2: 26 mmol/L (ref 20–31)
CREATININE: 1.11 mg/dL (ref 0.70–1.18)
Chloride: 105 mmol/L (ref 98–110)
GFR, EST AFRICAN AMERICAN: 74 mL/min (ref >=60)
GFR, EST NON AFRICAN AMERICAN: 64 mL/min (ref >=60)
Glucose, Bld: 94 mg/dL (ref 65–99)
Potassium: 4.4 mmol/L (ref 3.5–5.3)
Sodium: 140 mmol/L (ref 135–146)
TOTAL PROTEIN: 6.8 g/dL (ref 6.1–8.1)

## 2016-12-01 LAB — CBC WITH DIFFERENTIAL/PLATELET
BASOS PCT: 0 %
Basophils Absolute: 0 cells/uL (ref 0–200)
EOS ABS: 210 {cells}/uL (ref 15–500)
EOS PCT: 3 %
HCT: 43 % (ref 38.5–50.0)
Hemoglobin: 14.1 g/dL (ref 13.2–17.1)
LYMPHS PCT: 54 %
Lymphs Abs: 3780 cells/uL (ref 850–3900)
MCH: 30.7 pg (ref 27.0–33.0)
MCHC: 32.8 g/dL (ref 32.0–36.0)
MCV: 93.7 fL (ref 80.0–100.0)
MONOS PCT: 11 %
MPV: 11.1 fL (ref 7.5–12.5)
Monocytes Absolute: 770 cells/uL (ref 200–950)
Neutro Abs: 2240 cells/uL (ref 1500–7800)
Neutrophils Relative %: 32 %
PLATELETS: 224 10*3/uL (ref 140–400)
RBC: 4.59 MIL/uL (ref 4.20–5.80)
RDW: 13.5 % (ref 11.0–15.0)
WBC: 7 10*3/uL (ref 3.8–10.8)

## 2016-12-01 LAB — LIPID PANEL
CHOLESTEROL: 122 mg/dL (ref <200)
HDL: 43 mg/dL (ref >40)
LDL CALC: 61 mg/dL (ref <100)
TRIGLYCERIDES: 90 mg/dL (ref <150)
Total CHOL/HDL Ratio: 2.8 Ratio (ref <5.0)
VLDL: 18 mg/dL (ref <30)

## 2016-12-06 ENCOUNTER — Ambulatory Visit (INDEPENDENT_AMBULATORY_CARE_PROVIDER_SITE_OTHER): Payer: Medicare Other | Admitting: Osteopathic Medicine

## 2016-12-06 ENCOUNTER — Encounter: Payer: Self-pay | Admitting: Osteopathic Medicine

## 2016-12-06 VITALS — BP 137/61 | HR 72 | Ht 74.0 in | Wt 236.0 lb

## 2016-12-06 DIAGNOSIS — Z0000 Encounter for general adult medical examination without abnormal findings: Secondary | ICD-10-CM

## 2016-12-06 NOTE — Progress Notes (Signed)
HPI: Timothy [name] is a 75 y.o. male  who presents to [facility] today, 12/06/16,  for Medicare Annual Wellness Exam  Patient presents for annual physical/Medicare wellness exam. no complaints today.   Past medical, surgical, social and family history reviewed:  Patient Active Problem List   Diagnosis Date Noted  . Osteopenia 10/29/2015  . History of kidney stones 09/20/2015  . Screening for AAA (aortic abdominal aneurysm) 09/20/2015  . Arthritis 09/08/2015  . Essential hypertension 09/07/2015  . Seasonal allergies 09/07/2015  . Hyperlipidemia 09/07/2015  . CAD (coronary artery disease) 09/07/2015    Past Surgical History:  Procedure Laterality Date  . CARDIAC SURGERY Bilateral    2 stints 1- left side  & 1 right side    Social History   Social History  . Marital status: Married    Spouse name: N/A  . Number of children: N/A  . Years of education: N/A   Occupational History  . Not on file.   Social History Main Topics  . Smoking status: Former Smoker    Start date: 11/16/2012  . Smokeless tobacco: Not on file  . Alcohol use No  . Drug use: No  . Sexual activity: Not on file   Other Topics Concern  . Not on file   Social History Narrative  . No narrative on file    No family history on file.   Current medication list and allergy/intolerance information reviewed:    Outpatient Encounter Prescriptions as of 12/06/2016  Medication Sig  . aspirin 81 MG tablet Take 81 mg by mouth daily.  Marland Kitchen atorvastatin (LIPITOR) 10 MG tablet Take 1 tablet (10 mg total) by mouth daily.  . Calcium Carbonate-Vitamin D 600-400 MG-UNIT chew tablet Chew 2 tablets by mouth daily.  Marland Kitchen lisinopril (PRINIVIL,ZESTRIL) 5 MG tablet Take 1 tablet (5 mg total) by mouth daily.  . metoprolol succinate (TOPROL-XL) 25 MG 24 hr tablet Take 12.5 mg by mouth daily. (Take 1/2 tab daily)  . montelukast (SINGULAIR) 10 MG tablet Take 1 tablet (10 mg total) by mouth  daily.  . Multiple Vitamins-Minerals (AIRBORNE PO) Take by mouth daily.  . [DISCONTINUED] levocetirizine (XYZAL) 5 MG tablet Take 5 mg by mouth daily.   No facility-administered encounter medications on file as of 12/06/2016.     No Known Allergies     Review of Systems: Review of Systems - Negative    Medicare Wellness Questionnaire  Are there smokers in your home (other than you)? no  Depression Screen (Note: if answer to either of the following is "Yes", a more complete depression screening is indicated)   Q1: Over the past two weeks, have you felt down, depressed or hopeless? no  Q2: Over the past two weeks, have you felt little interest or pleasure in doing things? no  Have you lost interest or pleasure in daily life? no  Do you often feel hopeless? no  Do you cry easily over simple problems? no  Activities of Daily Living In your present state of health, do you have any difficulty performing the following activities?:  Driving? no Managing money?  no Feeding yourself? no Getting from bed to chair? no Climbing a flight of stairs? no Preparing food and eating?: no Bathing or showering? no Getting dressed: no Getting to the toilet? no Using the toilet: no Moving around from place to place: no In the past year have you fallen or had a near fall?: no  Hearing Difficulties:  Do you  often ask people to speak up or repeat themselves? yes Do you experience ringing or noises in your ears? yes  Do you have difficulty understanding soft or whispered voices? yes  Memory Difficulties:  Do you feel that you have a problem with memory? no  Do you often misplace items? no  Do you feel safe at home?  yes  Sexual Health:   Are you sexually active?  Yes  Do you have more than one partner?  No  Advanced Directives:   Advanced directives discussed: has NO advanced directive  - add't info requested. Referral to SW: not applicable  Additional information provided: yes  Risk  Factors  Current exercise habits: none  Dietary issues discussed:no concerns  Cardiac risk factors: known cardiac disease, prior MI   Exam:  BP 137/61   Pulse 72   Ht 6\' 2"  (1.88 m)   Wt 236 lb (107 kg)   BMI 30.30 kg/m  Vision by Snellen chart: right eye:see nurse notes, left eye:see nurse notes  Constitutional: VS see above. General Appearance: alert, well-developed, well-nourished, NAD  Ears, Nose, Mouth, Throat: MMM  Neck: No masses, trachea midline.   Respiratory: Normal respiratory effort. no wheeze, no rhonchi, no rales  Cardiovascular:No lower extremity edema.   Musculoskeletal: Gait normal. No clubbing/cyanosis of digits.   Neurological: Normal balance/coordination. No tremor. Recalls 3 objects and able to read face of watch with correct time.   Skin: warm, dry, intact. No rash/ulcer.   Psychiatric: Normal judgment/insight. Normal mood and affect. Oriented x3.     ASSESSMENT/PLAN:   Encounter for Medicare annual wellness exam  No diagnosis found.  CANCER SCREENING  Lung - USPSTF: [id number] w/ 30 py hx unless quit w/in 49yr - needs    Colon - does not need  Prostate - does not need OTHER DISEASE SCREENING  Lipid - does not need  DM2 - does not need  AAA - male 43-75yo ever smoked - does not need - pt states has had and was normal  Osteoporosis - women 76yo+, men 76yo+ - does not need INFECTIOUS DISEASE SCREENING  HIV - does not need  GC/CT - does not need  HepC -does not need  TB - does not need ADULT VACCINATION  Influenza - annual vaccine recommended  Td - booster every 10 years recommended  Zoster - option at 50, yes at 43+ - needs  PCV13 - does not need  PPSV23 - does not need Immunization History  Administered Date(s) Administered  . Influenza-Unspecified 11/07/2012, 11/10/2013, 11/23/2014  . Pneumococcal Conjugate-13 11/23/2014  . Pneumococcal Polysaccharide-23 06/30/2010   OTHER  Fall - exercise and Vit D age 77+ - does  not need  Advanced Directives -  Discussed as above   During the course of the visit the patient was educated and counseled about appropriate screening and preventive services as noted above.   Patient Instructions (the written plan) was given to the patient.  Medicare Attestation I have personally reviewed: The patient's medical and social history Their use of alcohol, tobacco or illicit drugs Their current medications and supplements The patient's functional ability including ADLs,fall risks, home safety risks, cognitive, and hearing and visual impairment Diet and physical activities Evidence for depression or mood disorders  The patient's weight, height, BMI, and visual acuity have been recorded in the chart.  I have made referrals, counseling, and provided education to the patient based on review of the above and I have provided the patient with a written personalized care  plan for preventive services.     Emeterio Reeve, DO   12/06/16   Visit summary with medication list and pertinent instructions was printed for patient to review. All questions at time of visit were answered - patient instructed to contact office with any additional concerns. ER/RTC precautions were reviewed with the patient. Follow-up plan: No Follow-up on file.

## 2016-12-06 NOTE — Addendum Note (Signed)
Addended by: Maryla Morrow on: 12/06/2016 05:48 PM   Modules accepted: Orders

## 2017-01-12 ENCOUNTER — Encounter: Payer: Self-pay | Admitting: *Deleted

## 2017-01-12 ENCOUNTER — Other Ambulatory Visit: Payer: Self-pay | Admitting: Acute Care

## 2017-01-12 DIAGNOSIS — Z87891 Personal history of nicotine dependence: Secondary | ICD-10-CM

## 2017-01-29 ENCOUNTER — Ambulatory Visit (INDEPENDENT_AMBULATORY_CARE_PROVIDER_SITE_OTHER)
Admission: RE | Admit: 2017-01-29 | Discharge: 2017-01-29 | Disposition: A | Discharge status: Home / Self Care | Payer: Medicare Other | Source: Ambulatory Visit | Attending: Acute Care | Admitting: Acute Care

## 2017-01-29 ENCOUNTER — Ambulatory Visit (INDEPENDENT_AMBULATORY_CARE_PROVIDER_SITE_OTHER): Payer: Medicare Other | Admitting: Acute Care

## 2017-01-29 ENCOUNTER — Encounter: Payer: Self-pay | Admitting: Acute Care

## 2017-01-29 DIAGNOSIS — Z87891 Personal history of nicotine dependence: Secondary | ICD-10-CM

## 2017-01-29 NOTE — Progress Notes (Signed)
Shared Decision Making Visit Lung Cancer Screening Program [phone])   Eligibility:  Age 76 y.o.  Pack Years Smoking History Calculation 50-pack-year smoker (# packs/per year x # years smoked)  Recent History of coughing up blood  no  Unexplained weight loss? no ( >Than 15 pounds within the last 6 months )  Prior History Lung / other cancer no (Diagnosis within the last 5 years already requiring surveillance chest CT Scans).  Smoking Status Former Smoker  Former Smokers: Years since quit: 5 years  Quit Date: 12/19/2011  Visit Components:  Discussion included one or more decision making aids. yes  Discussion included risk/benefits of screening. yes  Discussion included potential follow up diagnostic testing for abnormal scans. yes  Discussion included meaning and risk of over diagnosis. yes  Discussion included meaning and risk of False Positives. yes  Discussion included meaning of total radiation exposure. yes  Counseling Included:  Importance of adherence to annual lung cancer LDCT screening. yes  Impact of comorbidities on ability to participate in the program. yes  Ability and willingness to under diagnostic treatment. yes  Smoking Cessation Counseling:  Current Smokers:   Discussed importance of smoking cessation. yes  Information about tobacco cessation classes and interventions provided to patient. yes  Patient provided with "ticket" for LDCT Scan. yes  Symptomatic Patient. no  Counseling  Diagnosis Code: Tobacco Use Z72.0  Asymptomatic Patient yes  Counseling (Intermediate counseling: > three minutes counseling) [id number]  Former Smokers:   Discussed the importance of maintaining cigarette abstinence. yes  Diagnosis Code: Personal History of Nicotine Dependence. [id number]  Information about tobacco cessation classes and interventions provided to patient. Yes  Patient provided with "ticket" for LDCT Scan. yes  Written Order for Lung Cancer  Screening with LDCT placed in Epic. Yes (CT Chest Lung Cancer Screening Low Dose W/O CM) [id number] Z12.2-Screening of respiratory organs Z87.891-Personal history of nicotine dependence  I spent 25 minutes of face to face time with Timothy [name] discussing the risks and benefits of lung cancer screening. We viewed a power point together that explained in detail the above noted topics. We took the time to pause the power point at intervals to allow for questions to be asked and answered to ensure understanding. We discussed that he had taken the single most powerful action possible to decrease his risk of developing lung cancer when he quit smoking. I counseled him to remain smoke free, and to contact me if he ever had the desire to smoke again so that I can provide resources and tools to help support the effort to remain smoke free. We discussed the time and location of the scan, and that either [place] or I will call with the results within  24-48 hours of receiving them. Timothy [name] has my card and contact information in the event he needs to speak with me, in addition to a copy of the power point we reviewed as a resource. [name] verbalized understanding of all of the above and had no further questions upon leaving the office.   I spent 3-4 minutes counseling patient to remain smoke free, and the risks of continued tobacco abuse  Magdalen Spatz, NP 01/29/2017

## 2017-01-30 ENCOUNTER — Telehealth: Payer: Self-pay | Admitting: Acute Care

## 2017-01-30 DIAGNOSIS — Z87891 Personal history of nicotine dependence: Secondary | ICD-10-CM

## 2017-01-30 DIAGNOSIS — I2583 Coronary atherosclerosis due to lipid rich plaque: Secondary | ICD-10-CM

## 2017-01-30 DIAGNOSIS — I2510 Atherosclerotic heart disease of native coronary artery without angina pectoris: Secondary | ICD-10-CM

## 2017-01-30 NOTE — Telephone Encounter (Signed)
I have called [name] with the results of his low-dose screening CT. I explained that his scan was read as a Lung RADS 2: nodules that are benign in appearance and behavior with a very low likelihood of becoming a clinically active cancer due to size or lack of growth. Recommendation per radiology is for a repeat LDCT in 12 months. I told him we will order and schedule scan for February 2019. I also explained that there  was notation of coronary artery atherosclerosis  and aortic atherosclerosis. I explained that this is a common finding on this exam. He is currently on statin therapy per his PC. I told him I will forward the scan results to his primary care provider so that she can follow up  as she feels is clinically indicated. Additionally there was notation of esophageal air-fluid levels that suggest dysmotility or GERD. The patient states his wife has told him he often wakes especially at night coughing and clearing his throat. I explained that often times taking a Pepcid at bedtime, or a Zantac at bedtime can resolve this problem. I will have him follow-up with primary care provider. [name] verbalized understanding of the above and had no further questions at completion of the call. He has my contact information in the event he has questions in the future  regarding his scan results.

## 2017-01-31 DIAGNOSIS — I2510 Atherosclerotic heart disease of native coronary artery without angina pectoris: Secondary | ICD-10-CM | POA: Insufficient documentation

## 2017-01-31 NOTE — Telephone Encounter (Signed)
Noted, thanks!

## 2017-05-14 ENCOUNTER — Other Ambulatory Visit: Payer: Self-pay | Admitting: Osteopathic Medicine

## 2017-05-14 DIAGNOSIS — I10 Essential (primary) hypertension: Secondary | ICD-10-CM

## 2017-06-01 ENCOUNTER — Other Ambulatory Visit: Payer: Self-pay | Admitting: Osteopathic Medicine

## 2017-06-01 DIAGNOSIS — E785 Hyperlipidemia, unspecified: Secondary | ICD-10-CM

## 2017-06-01 DIAGNOSIS — J302 Other seasonal allergic rhinitis: Secondary | ICD-10-CM

## 2017-08-13 ENCOUNTER — Other Ambulatory Visit: Payer: Self-pay | Admitting: Osteopathic Medicine

## 2017-08-13 DIAGNOSIS — I10 Essential (primary) hypertension: Secondary | ICD-10-CM

## 2017-09-05 DIAGNOSIS — H35033 Hypertensive retinopathy, bilateral: Secondary | ICD-10-CM | POA: Diagnosis not present

## 2017-09-05 DIAGNOSIS — H2513 Age-related nuclear cataract, bilateral: Secondary | ICD-10-CM | POA: Diagnosis not present

## 2017-09-10 ENCOUNTER — Telehealth: Payer: Self-pay | Admitting: Osteopathic Medicine

## 2017-09-10 NOTE — Telephone Encounter (Signed)
PT needs a doctors note stating the he needs to wear hearing aids in order to properly hear conversations, please notify PT when note is ready.

## 2017-09-13 ENCOUNTER — Encounter: Payer: Self-pay | Admitting: Osteopathic Medicine

## 2017-09-13 NOTE — Telephone Encounter (Signed)
LEFT MESSAGE ON PATIENT VM ADVISING THAT FORM IS READY FOR PICK UP. Timothy [name],CMA

## 2017-09-13 NOTE — Telephone Encounter (Signed)
Not sure why he needs this, I just addressed it to whom it may concern. It is done and ready up front for him to pick up

## 2018-01-04 ENCOUNTER — Other Ambulatory Visit: Payer: Self-pay | Admitting: Osteopathic Medicine

## 2018-01-04 DIAGNOSIS — I10 Essential (primary) hypertension: Secondary | ICD-10-CM

## 2018-01-04 MED ORDER — LISINOPRIL 5 MG PO TABS: 5.0000 mg | ORAL_TABLET | Freq: Every day | ORAL | 0 refills | Status: DC

## 2018-01-16 ENCOUNTER — Ambulatory Visit (INDEPENDENT_AMBULATORY_CARE_PROVIDER_SITE_OTHER): Payer: Medicare Other | Admitting: Osteopathic Medicine

## 2018-01-16 ENCOUNTER — Encounter: Payer: Self-pay | Admitting: Osteopathic Medicine

## 2018-01-16 VITALS — BP 108/67 | HR 74 | Temp 97.8°F | Resp 16 | Wt 241.3 lb

## 2018-01-16 DIAGNOSIS — I2510 Atherosclerotic heart disease of native coronary artery without angina pectoris: Secondary | ICD-10-CM | POA: Diagnosis not present

## 2018-01-16 DIAGNOSIS — R238 Other skin changes: Secondary | ICD-10-CM

## 2018-01-16 DIAGNOSIS — E785 Hyperlipidemia, unspecified: Secondary | ICD-10-CM | POA: Diagnosis not present

## 2018-01-16 DIAGNOSIS — Z0000 Encounter for general adult medical examination without abnormal findings: Secondary | ICD-10-CM

## 2018-01-16 DIAGNOSIS — M8580 Other specified disorders of bone density and structure, unspecified site: Secondary | ICD-10-CM

## 2018-01-16 DIAGNOSIS — I2584 Coronary atherosclerosis due to calcified coronary lesion: Secondary | ICD-10-CM

## 2018-01-16 DIAGNOSIS — I10 Essential (primary) hypertension: Secondary | ICD-10-CM

## 2018-01-16 DIAGNOSIS — M899 Disorder of bone, unspecified: Secondary | ICD-10-CM

## 2018-01-16 DIAGNOSIS — R233 Spontaneous ecchymoses: Secondary | ICD-10-CM

## 2018-01-16 MED ORDER — LISINOPRIL 5 MG PO TABS: 5.0000 mg | ORAL_TABLET | Freq: Every day | ORAL | 3 refills | Status: DC

## 2018-01-16 MED ORDER — METOPROLOL SUCCINATE ER 25 MG PO TB24: 12.5000 mg | ORAL_TABLET | Freq: Every day | ORAL | 3 refills | Status: DC

## 2018-01-16 NOTE — Progress Notes (Signed)
HPI: Timothy [name] is a 77 y.o. male  who presents to [facility] today, 01/16/18,  for Medicare Annual Wellness Exam  Patient presents for annual physical/Medicare wellness exam.  Additional complaint: easy bruising, thin skin.   Taking meds every other day, except ASA and Airborne vitamins and calcium vitamins. Every other day, takes Lisinopril, Metoprolol, Statin - he states his BP is fine today so doesn't see an issue. He took his other meds last night as usual. No dizziness/weakness.    Past medical, surgical, social and family history reviewed:  Patient Active Problem List   Diagnosis Date Noted  . Coronary atherosclerosis 01/31/2017  . Osteopenia 10/29/2015  . History of kidney stones 09/20/2015  . Screening for AAA (aortic abdominal aneurysm) 09/20/2015  . Arthritis 09/08/2015  . Essential hypertension 09/07/2015  . Seasonal allergies 09/07/2015  . Hyperlipidemia 09/07/2015  . CAD (coronary artery disease) 09/07/2015    Past Surgical History:  Procedure Laterality Date  . CARDIAC SURGERY Bilateral    2 stints 1- left side  & 1 right side    Social History   Socioeconomic History  . Marital status: Married    Spouse name: Not on file  . Number of children: Not on file  . Years of education: Not on file  . Highest education level: Not on file  Social Needs  . Financial resource strain: Not on file  . Food insecurity - worry: Not on file  . Food insecurity - inability: Not on file  . Transportation needs - medical: Not on file  . Transportation needs - non-medical: Not on file  Occupational History  . Not on file  Tobacco Use  . Smoking status: Former Smoker    Packs/day: 1.00    Years: 50.00    Pack years: 50.00    Types: Cigarettes    Start date: 11/16/2012  . Smokeless tobacco: Never Used  . Tobacco comment: Encouraged to remain smoke free  Substance and Sexual Activity  . Alcohol use: No    Alcohol/week: 0.0 oz   . Drug use: No  . Sexual activity: Not on file  Other Topics Concern  . Not on file  Social History Narrative  . Not on file    No family history on file.   Current medication list and allergy/intolerance information reviewed:    Outpatient Encounter Medications as of 01/16/2018  Medication Sig  . aspirin 81 MG tablet Take 81 mg by mouth daily.  Marland Kitchen atorvastatin (LIPITOR) 10 MG tablet TAKE 1 TABLET DAILY  . Calcium Carbonate-Vitamin D 600-400 MG-UNIT chew tablet Chew 2 tablets by mouth daily.  Marland Kitchen lisinopril (PRINIVIL,ZESTRIL) 5 MG tablet Take 1 tablet (5 mg total) by mouth daily.  . metoprolol succinate (TOPROL-XL) 25 MG 24 hr tablet Take 0.5 tablets (12.5 mg total) by mouth daily. (Take 1/2 tab daily)  . montelukast (SINGULAIR) 10 MG tablet TAKE 1 TABLET DAILY  . Multiple Vitamins-Minerals (AIRBORNE PO) Take by mouth daily.  . [DISCONTINUED] lisinopril (PRINIVIL,ZESTRIL) 5 MG tablet Take 1 tablet (5 mg total) by mouth daily.  . [DISCONTINUED] metoprolol succinate (TOPROL-XL) 25 MG 24 hr tablet Take 12.5 mg by mouth daily. (Take 1/2 tab daily)   No facility-administered encounter medications on file as of 01/16/2018.     No Known Allergies     Review of Systems: Review of Systems - Negative    Medicare Wellness Questionnaire  Are there smokers in your home (other than you)? no  Depression Screen (  Note: if answer to either of the following is "Yes", a more complete depression screening is indicated)   Q1: Over the past two weeks, have you felt down, depressed or hopeless? no  Q2: Over the past two weeks, have you felt little interest or pleasure in doing things? no  Have you lost interest or pleasure in daily life? no  Do you often feel hopeless? no  Do you cry easily over simple problems? no  Activities of Daily Living In your present state of health, do you have any difficulty performing the following activities?:  Driving? no Managing money?  no Feeding yourself?  no Getting from bed to chair? no Climbing a flight of stairs? Slower but ok Preparing food and eating?: no Bathing or showering? no Getting dressed: no Getting to the toilet? no Using the toilet: no Moving around from place to place: no problems except long walking In the past year have you fallen or had a near fall?: no  Hearing Difficulties: has hearing aids  Do you often ask people to speak up or repeat themselves? yes Do you experience ringing or noises in your ears? yes  Do you have difficulty understanding soft or whispered voices? yes  Memory Difficulties:  Do you feel that you have a problem with memory? no  Do you often misplace items? no  Do you feel safe at home?  yes  Sexual Health:   Are you sexually active?  Yes  Do you have more than one partner?  No  Advanced Directives:   Advanced directives discussed: add't info given last visit   Additional information provided: yes  Risk Factors  Current exercise habits: none  Dietary issues discussed:no concerns  Cardiac risk factors: known cardiac disease, prior MI   Exam:  BP 108/67 (BP Location: Left Arm, Patient Position: Sitting, Cuff Size: Large)   Pulse 74   Temp 97.8 F (36.6 C) (Oral)   Resp 16   Wt 241 lb 4.8 oz (109.5 kg)   SpO2 97%   BMI 30.98 kg/m   Constitutional: VS see above. General Appearance: alert, well-developed, well-nourished, NAD  Ears, Nose, Mouth, Throat: MMM  Neck: No masses, trachea midline.   Respiratory: Normal respiratory effort. no wheeze, no rhonchi, no rales  Cardiovascular:No lower extremity edema.   Musculoskeletal: Gait normal. No clubbing/cyanosis of digits.   Neurological: Normal balance/coordination. No tremor. Recalls 3 objects and able to read face of watch with correct time.   Skin: warm, dry, intact. No rash/ulcer.   Psychiatric: Normal judgment/insight. Normal mood and affect. Oriented x3.     ASSESSMENT/PLAN:    Encounter for Medicare annual  wellness exam  Coronary artery disease due to calcified coronary lesion - Plan: CBC, COMPLETE METABOLIC PANEL WITH GFR, Lipid panel, VITAMIN D 25 Hydroxy (Vit-D Deficiency, Fractures), CP4508-PT/INR AND PTT  Hyperlipidemia, unspecified hyperlipidemia type  Osteopenia, unspecified location - Plan: VITAMIN D 25 Hydroxy (Vit-D Deficiency, Fractures)  Bruises easily - Plan: CBC, CP4508-PT/INR AND PTT  Bone disorder - Plan: VITAMIN D 25 Hydroxy (Vit-D Deficiency, Fractures)  Essential hypertension - Plan: lisinopril (PRINIVIL,ZESTRIL) 5 MG tablet   Patient Instructions  Recommend taking all medications DAILY - blood pressure is good today because you took the medicines last night! If yo don't take them daily, your pressure probably fluctuates, and you may not be getting adequate protection from heart disease.   See attached information for advanced directives.   Recommend annual flu vaccine! Come see [place] if you decide you'd like this.  CANCER SCREENING  Lung - USPSTF: [id number] w/ 30 py hx unless quit w/in 23yr - annual screening advised, last CT 01/2017 no concerns   Colon - does not need  Prostate - does not need OTHER DISEASE SCREENING  Lipid - needs  DM2 - needs  AAA - male 30-75yo ever smoked - does not need - pt states has had and was normal  Osteoporosis - women 78yo+, men 78yo+ - needs, last in 10/2015 -declines for now  INFECTIOUS DISEASE SCREENING  HIV - does not need  GC/CT - does not need  HepC -does not need  TB - does not need ADULT VACCINATION  Influenza - annual vaccine recommended - declined today   Td - booster every 10 years recommended  Zoster - option at 26, yes at 69+ - declined today   PCV13 - does not need  PPSV23 - does not need Immunization History  Administered Date(s) Administered  . Influenza-Unspecified 11/07/2012, 11/10/2013, 11/23/2014  . Pneumococcal Conjugate-13 11/23/2014  . Pneumococcal Polysaccharide-23 06/30/2010    OTHER  Fall - exercise and Vit D age 53+ - does not need  Advanced Directives -  Discussed as above   During the course of the visit the patient was educated and counseled about appropriate screening and preventive services as noted above.   Patient Instructions (the written plan) was given to the patient.  Medicare Attestation I have personally reviewed: The patient's medical and social history Their use of alcohol, tobacco or illicit drugs Their current medications and supplements The patient's functional ability including ADLs,fall risks, home safety risks, cognitive, and hearing and visual impairment Diet and physical activities Evidence for depression or mood disorders  The patient's weight, height, BMI, and visual acuity have been recorded in the chart.  I have made referrals, counseling, and provided education to the patient based on review of the above and I have provided the patient with a written personalized care plan for preventive services.     Emeterio Reeve, DO   01/16/18   Visit summary with medication list and pertinent instructions was printed for patient to review. All questions at time of visit were answered - patient instructed to contact office with any additional concerns. ER/RTC precautions were reviewed with the patient. Follow-up plan: Return in about 1 year (around 01/16/2019) for [facility], sooner if needed .

## 2018-01-16 NOTE — Patient Instructions (Signed)
Recommend taking all medications DAILY - blood pressure is good today because you took the medicines last night! If yo don't take them daily, your pressure probably fluctuates, and you may not be getting adequate protection from heart disease.   See attached information for advanced directives.   Recommend annual flu vaccine! Come see [place] if you decide you'd like this.

## 2018-01-17 LAB — COMPLETE METABOLIC PANEL WITH GFR
AG RATIO: 1.6 (calc) (ref 1.0–2.5)
ALBUMIN MSPROF: 3.9 g/dL (ref 3.6–5.1)
ALKALINE PHOSPHATASE (APISO): 72 U/L (ref 40–115)
ALT: 10 U/L (ref 9–46)
AST: 12 U/L (ref 10–35)
BILIRUBIN TOTAL: 0.4 mg/dL (ref 0.2–1.2)
BUN: 25 mg/dL (ref 7–25)
CHLORIDE: 108 mmol/L (ref 98–110)
CO2: 29 mmol/L (ref 20–32)
Calcium: 9.6 mg/dL (ref 8.6–10.3)
Creat: 1.04 mg/dL (ref 0.70–1.18)
GFR, EST AFRICAN AMERICAN: 80 mL/min/{1.73_m2} (ref >=60)
GFR, Est Non African American: 69 mL/min/{1.73_m2} (ref >=60)
GLOBULIN: 2.5 g/dL (ref 1.9–3.7)
Glucose, Bld: 71 mg/dL (ref 65–139)
POTASSIUM: 5.3 mmol/L (ref 3.5–5.3)
SODIUM: 143 mmol/L (ref 135–146)
TOTAL PROTEIN: 6.4 g/dL (ref 6.1–8.1)

## 2018-01-17 LAB — CBC
HCT: 39.4 % (ref 38.5–50.0)
Hemoglobin: 13.4 g/dL (ref 13.2–17.1)
MCH: 30.9 pg (ref 27.0–33.0)
MCHC: 34 g/dL (ref 32.0–36.0)
MCV: 91 fL (ref 80.0–100.0)
MPV: 11 fL (ref 7.5–12.5)
PLATELETS: 221 10*3/uL (ref 140–400)
RBC: 4.33 10*6/uL (ref 4.20–5.80)
RDW: 12.3 % (ref 11.0–15.0)
WBC: 6.9 10*3/uL (ref 3.8–10.8)

## 2018-01-17 LAB — LIPID PANEL
CHOLESTEROL: 117 mg/dL (ref <200)
HDL: 43 mg/dL (ref >40)
LDL Cholesterol (Calc): 54 mg/dL (calc)
Non-HDL Cholesterol (Calc): 74 mg/dL (calc) (ref <130)
Total CHOL/HDL Ratio: 2.7 (calc) (ref <5.0)
Triglycerides: 123 mg/dL (ref <150)

## 2018-01-17 LAB — CP4508-PT/INR AND PTT
INR: 1.1
PROTHROMBIN TIME: 11.2 s (ref 9.0–11.5)
aPTT: 30 s (ref 22–34)

## 2018-01-17 LAB — VITAMIN D 25 HYDROXY (VIT D DEFICIENCY, FRACTURES): VIT D 25 HYDROXY: 26 ng/mL — AB (ref 30–100)

## 2018-01-30 ENCOUNTER — Ambulatory Visit: Payer: Medicare Other

## 2018-01-30 DIAGNOSIS — Z87891 Personal history of nicotine dependence: Secondary | ICD-10-CM

## 2018-07-18 ENCOUNTER — Other Ambulatory Visit: Payer: Self-pay

## 2018-11-05 ENCOUNTER — Other Ambulatory Visit: Payer: Self-pay

## 2019-01-12 ENCOUNTER — Other Ambulatory Visit: Payer: Self-pay | Admitting: Osteopathic Medicine

## 2019-01-12 DIAGNOSIS — E785 Hyperlipidemia, unspecified: Secondary | ICD-10-CM

## 2019-01-12 DIAGNOSIS — J302 Other seasonal allergic rhinitis: Secondary | ICD-10-CM

## 2019-01-12 DIAGNOSIS — I10 Essential (primary) hypertension: Secondary | ICD-10-CM

## 2019-01-14 ENCOUNTER — Other Ambulatory Visit: Payer: Self-pay | Admitting: Osteopathic Medicine

## 2019-02-06 ENCOUNTER — Encounter: Payer: Self-pay | Admitting: Osteopathic Medicine

## 2019-02-06 ENCOUNTER — Ambulatory Visit (INDEPENDENT_AMBULATORY_CARE_PROVIDER_SITE_OTHER): Payer: Medicare Other | Admitting: Osteopathic Medicine

## 2019-02-06 VITALS — BP 108/67 | HR 77 | Temp 98.0°F | Wt 239.3 lb

## 2019-02-06 DIAGNOSIS — J302 Other seasonal allergic rhinitis: Secondary | ICD-10-CM

## 2019-02-06 DIAGNOSIS — E559 Vitamin D deficiency, unspecified: Secondary | ICD-10-CM | POA: Diagnosis not present

## 2019-02-06 DIAGNOSIS — Z0289 Encounter for other administrative examinations: Secondary | ICD-10-CM

## 2019-02-06 DIAGNOSIS — I10 Essential (primary) hypertension: Secondary | ICD-10-CM | POA: Diagnosis not present

## 2019-02-06 DIAGNOSIS — I2510 Atherosclerotic heart disease of native coronary artery without angina pectoris: Secondary | ICD-10-CM | POA: Diagnosis not present

## 2019-02-06 DIAGNOSIS — M899 Disorder of bone, unspecified: Secondary | ICD-10-CM

## 2019-02-06 DIAGNOSIS — M8580 Other specified disorders of bone density and structure, unspecified site: Secondary | ICD-10-CM | POA: Diagnosis not present

## 2019-02-06 DIAGNOSIS — I2584 Coronary atherosclerosis due to calcified coronary lesion: Secondary | ICD-10-CM

## 2019-02-06 DIAGNOSIS — E785 Hyperlipidemia, unspecified: Secondary | ICD-10-CM | POA: Diagnosis not present

## 2019-02-06 MED ORDER — CALCIUM CARBONATE-VITAMIN D 600-400 MG-UNIT PO CHEW: 2.0000 | CHEWABLE_TABLET | Freq: Every day | ORAL | 3 refills | Status: DC

## 2019-02-06 MED ORDER — ASPIRIN 81 MG PO TABS: 81.0000 mg | ORAL_TABLET | Freq: Every day | ORAL | 3 refills | Status: DC

## 2019-02-06 MED ORDER — MONTELUKAST SODIUM 10 MG PO TABS: 10.0000 mg | ORAL_TABLET | Freq: Every day | ORAL | 3 refills | Status: DC

## 2019-02-06 MED ORDER — METOPROLOL SUCCINATE ER 25 MG PO TB24: 12.5000 mg | ORAL_TABLET | Freq: Every day | ORAL | 3 refills | Status: DC

## 2019-02-06 MED ORDER — LISINOPRIL 5 MG PO TABS: 5.0000 mg | ORAL_TABLET | Freq: Every day | ORAL | 3 refills | Status: DC

## 2019-02-06 NOTE — Progress Notes (Signed)
HPI: Timothy [name] is a 78 y.o. male who  has a past medical history of Arthritis (09/08/2015), CAD (coronary artery disease) (09/07/2015), Essential hypertension (09/07/2015), History of kidney stones (09/20/2015), Hyperlipidemia (09/07/2015), Osteopenia (10/29/2015), Screening for AAA (aortic abdominal aneurysm) (09/20/2015), and Seasonal allergies (09/07/2015).  he presents to [facility] today, 02/06/19,  for chief complaint of: Recheck chronic issues  HTN: well controlled, no CP/SOB, no HA/VC  CAD: has stopped atorvastatin "everything you read about it is bad." CAD found on CT Chest for Lung CA screen. No MI/CVA      Past medical, surgical, social and family history reviewed:  Patient Active Problem List   Diagnosis Date Noted  . Encounter for physical examination of prospective foster parent 02/06/2019  . Coronary atherosclerosis 01/31/2017  . Osteopenia 10/29/2015  . History of kidney stones 09/20/2015  . Screening for AAA (aortic abdominal aneurysm) 09/20/2015  . Arthritis 09/08/2015  . Essential hypertension 09/07/2015  . Seasonal allergies 09/07/2015  . Hyperlipidemia 09/07/2015  . CAD (coronary artery disease) 09/07/2015    Past Surgical History:  Procedure Laterality Date  . CARDIAC SURGERY Bilateral    2 stints 1- left side  & 1 right side    Social History   Tobacco Use  . Smoking status: Former Smoker    Packs/day: 1.00    Years: 50.00    Pack years: 50.00    Types: Cigarettes    Start date: 11/16/2012  . Smokeless tobacco: Never Used  . Tobacco comment: Encouraged to remain smoke free  Substance Use Topics  . Alcohol use: No    Alcohol/week: 0.0 standard drinks    No family history on file.   Current medication list and allergy/intolerance information reviewed:    Current Outpatient Medications  Medication Sig Dispense Refill  . aspirin 81 MG tablet Take 1 tablet (81 mg total) by mouth daily. 90 tablet 3  .  Calcium Carbonate-Vitamin D 600-400 MG-UNIT chew tablet Chew 2 tablets by mouth daily. 180 tablet 3  . lisinopril (PRINIVIL,ZESTRIL) 5 MG tablet Take 1 tablet (5 mg total) by mouth daily. 90 tablet 3  . metoprolol succinate (TOPROL XL) 25 MG 24 hr tablet Take 0.5 tablets (12.5 mg total) by mouth daily. 45 tablet 3  . montelukast (SINGULAIR) 10 MG tablet Take 1 tablet (10 mg total) by mouth daily. 90 tablet 3  . Multiple Vitamins-Minerals (AIRBORNE PO) Take by mouth daily.    . TURMERIC PO Take by mouth.    Marland Kitchen atorvastatin (LIPITOR) 10 MG tablet TAKE 1 TABLET DAILY (Patient not taking: Reported on 02/06/2019) 90 tablet 4   No current facility-administered medications for this visit.     No Known Allergies    Review of Systems:  Constitutional:  No  fever, no chills, No recent illness, No unintentional weight changes. No significant fatigue.   HEENT: No  headache, no vision change, no hearing change, No sore throat, No  sinus pressure  Cardiac: No  chest pain, No  pressure, No palpitations, No  Orthopnea  Respiratory:  No  shortness of breath. No  Cough  Gastrointestinal: No  abdominal pain, No  nausea, No  vomiting,  No  blood in stool, No  diarrhea, No  constipation   Musculoskeletal: No new myalgia/arthralgia  Skin: No  Rash, No other wounds/concerning lesions  Genitourinary: No  incontinence, No  abnormal genital bleeding, No abnormal genital discharge  Hem/Onc: +easy bruising/bleeding  Endocrine: No cold intolerance,  No heat intolerance  Neurologic: No  weakness, No  dizziness, No  slurred speech/focal weakness/facial droop  Psychiatric: No  concerns with depression, No  concerns with anxiety, No sleep problems, No mood problems  Exam:  BP 108/67 (BP Location: Left Arm, Patient Position: Sitting, Cuff Size: Normal)   Pulse 77   Temp 98 F (36.7 C) (Oral)   Wt 239 lb 4.8 oz (108.5 kg)   BMI 30.72 kg/m   Constitutional: VS see above. General Appearance: alert,  well-developed, well-nourished, NAD  Eyes: Normal lids and conjunctive, non-icteric sclera  Ears, Nose, Mouth, Throat: MMM, Normal external inspection ears/nares/mouth/lips/gums. TM normal bilaterally. Pharynx/tonsils no erythema, no exudate. Nasal mucosa normal.   Neck: No masses, trachea midline. No thyroid enlargement. No tenderness/mass appreciated. No lymphadenopathy  Respiratory: Normal respiratory effort. no wheeze, no rhonchi, no rales  Cardiovascular: S1/S2 normal, no murmur, no rub/gallop auscultated. RRR. No lower extremity edema. .  Gastrointestinal: Nontender, no masses. No hepatomegaly, no splenomegaly. No hernia appreciated. Bowel sounds normal. Rectal exam deferred.   Musculoskeletal: Gait normal. No clubbing/cyanosis of digits.   Neurological: Normal balance/coordination. No tremor. No cranial nerve deficit on limited exam. Motor and sensation intact and symmetric. Cerebellar reflexes intact.   Skin: warm, dry, intact.   Psychiatric: Normal judgment/insight. Normal mood and affect. Oriented x3.     ASSESSMENT/PLAN: The primary encounter diagnosis was Essential hypertension. Diagnoses of Coronary artery disease due to calcified coronary lesion, Hyperlipidemia, unspecified hyperlipidemia type, Osteopenia, unspecified location, Vitamin D deficiency, Hyperlipidemia, Osteopenia, Seasonal allergies, Bone disorder, and Encounter for physical examination of prospective foster parent were also pertinent to this visit.   Orders Placed This Encounter  Procedures  . CBC  . COMPLETE METABOLIC PANEL WITH GFR  . Lipid panel  . VITAMIN D 25 Hydroxy (Vit-D Deficiency, Fractures)    Meds ordered this encounter  Medications  . aspirin 81 MG tablet    Sig: Take 1 tablet (81 mg total) by mouth daily.    Dispense:  90 tablet    Refill:  3  . Calcium Carbonate-Vitamin D 600-400 MG-UNIT chew tablet    Sig: Chew 2 tablets by mouth daily.    Dispense:  180 tablet    Refill:  3  .  lisinopril (PRINIVIL,ZESTRIL) 5 MG tablet    Sig: Take 1 tablet (5 mg total) by mouth daily.    Dispense:  90 tablet    Refill:  3  . metoprolol succinate (TOPROL XL) 25 MG 24 hr tablet    Sig: Take 0.5 tablets (12.5 mg total) by mouth daily.    Dispense:  45 tablet    Refill:  3  . montelukast (SINGULAIR) 10 MG tablet    Sig: Take 1 tablet (10 mg total) by mouth daily.    Dispense:  90 tablet    Refill:  3    Patient Instructions  General Preventive Care  Most recent routine screening lipids/other labs: ordered today!   Everyone should have blood pressure checked once per year.   Tobacco: don't!  Alcohol: would limit to occasional use.   Exercise: as tolerated to reduce risk of cardiovascular disease and diabetes. Strength training will also prevent osteoporosis.   Mental health: if need for mental health care (medicines, counseling, other), or concerns about moods, please let me know!   Sexual health: if need for STD testing, or if concerns with libido/pain problems, please let me know!  Advanced Directive: Living Will and/or Healthcare Power of Attorney recommended for all adults, regardless of age or  health.  Vaccines  Flu vaccine: recommended for almost everyone, every fall.   Shingles vaccine: Shingrix recommended after age 20. See printed Rx - please ask your pharmacy about this (Medicare won't let us give it in the office!)  Pneumonia vaccines: Prevnar and Pneumovax series complete!   Tetanus booster: Tdap recommended every 10 years.  Cancer screenings   Colon cancer screening: recommended age 79-75 = aged out  Prostate cancer screening: PSA blood test around age 88-71 = aged out  Lung cancer screening: CT chest every year for those sge 60 to 79 years old with ?30 pack year smoking history, who either currently smoke or have quit within the past 15 years. = aged out Infection screenings . HIV, Gonorrhea/Chlamydia: screening if needed . Hepatitis C:  recommended for anyone born 30-1966 = no need . TB: certain at-risk populations, or depending on work requirements and/or travel history Other . Bone Density Test: recommended every other year for men at age 52+ . Abdominal Aortic Aneurysm: screening already done and was normal, no follow-up needed             Visit summary with medication list and pertinent instructions was printed for patient to review. All questions at time of visit were answered - patient instructed to contact office with any additional concerns or updates. ER/RTC precautions were reviewed with the patient.   Note: Total time spent 25 minutes, greater than 50% of the visit was spent face-to-face counseling and coordinating care for the above diagnoses listed in assessment/plan.   Please note: voice recognition software was used to produce this document, and typos may escape review. Please contact Dr. Sheppard Coil for any needed clarifications.     Follow-up plan: Return in about 1 year (around 02/07/2020) for [facility], sooner if needed .

## 2019-02-06 NOTE — Patient Instructions (Addendum)
General Preventive Care  Most recent routine screening lipids/other labs: ordered today!   Everyone should have blood pressure checked once per year.   Tobacco: don't!  Alcohol: would limit to occasional use.   Exercise: as tolerated to reduce risk of cardiovascular disease and diabetes. Strength training will also prevent osteoporosis.   Mental health: if need for mental health care (medicines, counseling, other), or concerns about moods, please let me know!   Sexual health: if need for STD testing, or if concerns with libido/pain problems, please let me know!  Advanced Directive: Living Will and/or Healthcare Power of Attorney recommended for all adults, regardless of age or health.  Vaccines  Flu vaccine: recommended for almost everyone, every fall.   Shingles vaccine: Shingrix recommended after age 60. See printed Rx - please ask your pharmacy about this (Medicare won't let us give it in the office!)  Pneumonia vaccines: Prevnar and Pneumovax series complete!   Tetanus booster: Tdap recommended every 10 years.  Cancer screenings   Colon cancer screening: recommended age 65-75 = aged out  Prostate cancer screening: PSA blood test around age 42-71 = aged out  Lung cancer screening: CT chest every year for those sge 81 to 79 years old with ?30 pack year smoking history, who either currently smoke or have quit within the past 15 years. = aged out Infection screenings . HIV, Gonorrhea/Chlamydia: screening if needed . Hepatitis C: recommended for anyone born 66-1966 = no need . TB: certain at-risk populations, or depending on work requirements and/or travel history Other . Bone Density Test: recommended every other year for men at age 45+ . Abdominal Aortic Aneurysm: screening already done and was normal, no follow-up needed

## 2019-02-07 DIAGNOSIS — I2584 Coronary atherosclerosis due to calcified coronary lesion: Secondary | ICD-10-CM | POA: Diagnosis not present

## 2019-02-07 DIAGNOSIS — I10 Essential (primary) hypertension: Secondary | ICD-10-CM | POA: Diagnosis not present

## 2019-02-07 DIAGNOSIS — E559 Vitamin D deficiency, unspecified: Secondary | ICD-10-CM | POA: Diagnosis not present

## 2019-02-07 DIAGNOSIS — E785 Hyperlipidemia, unspecified: Secondary | ICD-10-CM | POA: Diagnosis not present

## 2019-02-07 DIAGNOSIS — M8580 Other specified disorders of bone density and structure, unspecified site: Secondary | ICD-10-CM | POA: Diagnosis not present

## 2019-02-07 DIAGNOSIS — I2510 Atherosclerotic heart disease of native coronary artery without angina pectoris: Secondary | ICD-10-CM | POA: Diagnosis not present

## 2019-02-08 LAB — CBC
HCT: 40.1 % (ref 38.5–50.0)
Hemoglobin: 13.5 g/dL (ref 13.2–17.1)
MCH: 31 pg (ref 27.0–33.0)
MCHC: 33.7 g/dL (ref 32.0–36.0)
MCV: 92 fL (ref 80.0–100.0)
MPV: 10.8 fL (ref 7.5–12.5)
Platelets: 254 10*3/uL (ref 140–400)
RBC: 4.36 10*6/uL (ref 4.20–5.80)
RDW: 12.4 % (ref 11.0–15.0)
WBC: 9.3 10*3/uL (ref 3.8–10.8)

## 2019-02-08 LAB — LIPID PANEL
Cholesterol: 148 mg/dL (ref <200)
HDL: 39 mg/dL — ABNORMAL LOW (ref >=40)
LDL Cholesterol (Calc): 84 mg/dL (calc)
Non-HDL Cholesterol (Calc): 109 mg/dL (calc) (ref <130)
Total CHOL/HDL Ratio: 3.8 (calc) (ref <5.0)
Triglycerides: 151 mg/dL — ABNORMAL HIGH (ref <150)

## 2019-02-08 LAB — COMPLETE METABOLIC PANEL WITH GFR
AG Ratio: 1.6 (calc) (ref 1.0–2.5)
ALT: 8 U/L — ABNORMAL LOW (ref 9–46)
AST: 14 U/L (ref 10–35)
Albumin: 4 g/dL (ref 3.6–5.1)
Alkaline phosphatase (APISO): 67 U/L (ref 35–144)
BUN: 23 mg/dL (ref 7–25)
CO2: 28 mmol/L (ref 20–32)
Calcium: 9.4 mg/dL (ref 8.6–10.3)
Chloride: 104 mmol/L (ref 98–110)
Creat: 0.98 mg/dL (ref 0.70–1.18)
GFR, Est African American: 85 mL/min/{1.73_m2} (ref >=60)
GFR, Est Non African American: 74 mL/min/{1.73_m2} (ref >=60)
Globulin: 2.5 g/dL (calc) (ref 1.9–3.7)
Glucose, Bld: 94 mg/dL (ref 65–139)
Potassium: 4.8 mmol/L (ref 3.5–5.3)
Sodium: 140 mmol/L (ref 135–146)
TOTAL PROTEIN: 6.5 g/dL (ref 6.1–8.1)
Total Bilirubin: 0.4 mg/dL (ref 0.2–1.2)

## 2019-02-08 LAB — VITAMIN D 25 HYDROXY (VIT D DEFICIENCY, FRACTURES): Vit D, 25-Hydroxy: 33 ng/mL (ref 30–100)

## 2019-04-16 ENCOUNTER — Encounter: Payer: Self-pay | Admitting: Family Medicine

## 2019-04-16 ENCOUNTER — Ambulatory Visit (INDEPENDENT_AMBULATORY_CARE_PROVIDER_SITE_OTHER): Payer: Medicare Other | Admitting: Family Medicine

## 2019-04-16 VITALS — BP 114/70 | HR 95 | Wt 235.9 lb

## 2019-04-16 DIAGNOSIS — I9589 Other hypotension: Secondary | ICD-10-CM

## 2019-04-16 DIAGNOSIS — I4891 Unspecified atrial fibrillation: Secondary | ICD-10-CM | POA: Insufficient documentation

## 2019-04-16 DIAGNOSIS — R002 Palpitations: Secondary | ICD-10-CM | POA: Diagnosis not present

## 2019-04-16 DIAGNOSIS — I2510 Atherosclerotic heart disease of native coronary artery without angina pectoris: Secondary | ICD-10-CM

## 2019-04-16 DIAGNOSIS — I480 Paroxysmal atrial fibrillation: Secondary | ICD-10-CM | POA: Diagnosis not present

## 2019-04-16 DIAGNOSIS — D692 Other nonthrombocytopenic purpura: Secondary | ICD-10-CM | POA: Diagnosis not present

## 2019-04-16 DIAGNOSIS — I2584 Coronary atherosclerosis due to calcified coronary lesion: Secondary | ICD-10-CM

## 2019-04-16 DIAGNOSIS — I4819 Other persistent atrial fibrillation: Secondary | ICD-10-CM | POA: Diagnosis not present

## 2019-04-16 NOTE — Patient Instructions (Addendum)
Thank you for coming in today.  Continue current medicine.   Continue hydration.  If you blood pressure continue to be low and you feel bad we will cut the lisinopril in 1/2.   Keep me and Dr Sheppard Coil updated.   Recheck as scheduled.   Get labs today.   I think the problem is atrial fibulation.   If worsening let us know.   I think Dr Sheppard Coil will want a recheck soon but I will talk with her first.    Atrial Fibrillation  Atrial fibrillation is a type of heartbeat that is irregular or fast (rapid). If you have this condition, your heart beats without any order. This makes it hard for your heart to pump blood in a normal way. Having this condition gives you more risk for stroke, heart failure, and other heart problems. Atrial fibrillation may start all of a sudden and then stop on its own, or it may become a long-lasting problem. What are the causes? This condition may be caused by heart conditions, such as:  High blood pressure.  Heart failure.  Heart valve disease.  Heart surgery. Other causes include:  Pneumonia.  Obstructive sleep apnea.  Lung cancer.  Thyroid disease.  Drinking too much alcohol. Sometimes the cause is not known. What increases the risk? You are more likely to develop this condition if:  You smoke.  You are older.  You have diabetes.  You are overweight.  You have a family history of this condition.  You exercise often and hard. What are the signs or symptoms? Common symptoms of this condition include:  A feeling like your heart is beating very fast.  Chest pain.  Feeling short of breath.  Feeling light-headed or weak.  Getting tired easily. Follow these instructions at home: Medicines  Take over-the-counter and prescription medicines only as told by your doctor.  If your doctor gives you a blood-thinning medicine, take it exactly as told. Taking too much of it can cause bleeding. Taking too little of it does not  protect you against clots. Clots can cause a stroke. Lifestyle      Do not use any tobacco products. These include cigarettes, chewing tobacco, and e-cigarettes. If you need help quitting, ask your doctor.  Do not drink alcohol.  Do not drink beverages that have caffeine. These include coffee, soda, and tea.  Follow diet instructions as told by your doctor.  Exercise regularly as told by your doctor. General instructions  If you have a condition that causes breathing to stop for a short period of time (apnea), treat it as told by your doctor.  Keep a healthy weight. Do not use diet pills unless your doctor says they are safe for you. Diet pills may make heart problems worse.  Keep all follow-up visits as told by your doctor. This is important. Contact a doctor if:  You notice a change in the speed, rhythm, or strength of your heartbeat.  You are taking a blood-thinning medicine and you see more bruising.  You get tired more easily when you move or exercise.  You have a sudden change in weight. Get help right away if:   You have pain in your chest or your belly (abdomen).  You have trouble breathing.  You have blood in your vomit, poop, or pee (urine).  You have any signs of a stroke. "BE FAST" is an easy way to remember the main warning signs: ? B - Balance. Signs are dizziness, sudden trouble walking,  or loss of balance. ? E - Eyes. Signs are trouble seeing or a change in how you see. ? F - Face. Signs are sudden weakness or loss of feeling in the face, or the face or eyelid drooping on one side. ? A - Arms. Signs are weakness or loss of feeling in an arm. This happens suddenly and usually on one side of the body. ? S - Speech. Signs are sudden trouble speaking, slurred speech, or trouble understanding what people say. ? T - Time. Time to call emergency services. Write down what time symptoms started.  You have other signs of a stroke, such as: ? A sudden, very bad  headache with no known cause. ? Feeling sick to your stomach (nausea). ? Throwing up (vomiting). ? Jerky movements you cannot control (seizure). These symptoms may be an emergency. Do not wait to see if the symptoms will go away. Get medical help right away. Call your local emergency services (911 in the [place] Do not drive yourself to the hospital. Summary  Atrial fibrillation is a type of heartbeat that is irregular or fast (rapid).  You are at higher risk of this condition if you smoke, are older, have diabetes, or are overweight.  Follow your doctor's instructions about medicines, diet, exercise, and follow-up visits.  Get help right away if you think that you have signs of a stroke. This information is not intended to replace advice given to you by your health care provider. Make sure you discuss any questions you have with your health care provider. Document Released: 09/12/2008 Document Revised: 01/25/2018 Document Reviewed: 01/25/2018 Elsevier Interactive Patient Education  2019 [employer].

## 2019-04-16 NOTE — Progress Notes (Signed)
Timothy [name] is a 78 y.o. male who presents to [facility] today for hypotension and lightheadedness.  This morning when Timothy [name] woke up he felt lightheaded and dizzy.  This lasted a few minutes and improved when he drank some water.  His wife checked his blood pressure using a home wrist blood pressure cuff and got a measurement of 86/43.  She remeasured the blood pressure which improved to low 759F systolic.  Now he is feeling completely asymptomatic and back to normal.  He denies chest pain palpitation lightheadedness or dizziness.  He notes that he has a history of atrial fibrillation years ago in the past.  He does not think that he has it anymore but notes that he takes metoprolol to help control arrhythmia.  He notes he is been on this medication for quite some time.  He does not think he has had a heart work-up in this area but thinks he had a heart work-up with his previous doctor in the past.  He also notes that he takes 81 mg of aspirin daily.  He notes this causes bruising which he finds very obnoxious.  He is reluctant to consider more aggressive anticoagulation.  ROS as above: No chest pain shortness of breath current lightheadedness or dizziness or exertional dyspnea.  No nausea vomiting diarrhea.  No new skin rash. No leg swelling orthopnea.  Exam:  BP 114/70   Pulse 95   Wt 235 lb 14.4 oz (107 kg)   SpO2 95%   BMI 30.29 kg/m  Wt Readings from Last 5 Encounters:  04/16/19 235 lb 14.4 oz (107 kg)  02/06/19 239 lb 4.8 oz (108.5 kg)  01/16/18 241 lb 4.8 oz (109.5 kg)  12/06/16 236 lb (107 kg)  06/06/16 234 lb (106.1 kg)    Gen: Well NAD HEENT: EOMI,  MMM Lungs: Normal work of breathing. CTABL Heart: Irregular irregular heart rhythm with rate of about 90 bpm with no MRG Abd: NABS, Soft. Nondistended, Nontender Exts: Brisk capillary refill, warm and well  perfused.  Skin: Senile purpura present upper extremities bilaterally.  Lab and Radiology Results Twelve-lead EKG shows atrial fibrillation with a heart rate of about 100 bpm.  No ST segment elevation or depression.  No Q waves.  EKG compared to twelve-lead EKG from 2016 showing sinus rhythm.     Assessment and Plan: 78 y.o. male with  New onset or more likely paroxysmal atrial fibrillation.  Patient is in atrial fibrillation today but it sounds like most of the time he is in normal sinus rhythm.  His hypotension and lightheadedness this morning could be due to dehydration or possibly due to episode of atrial fibrillation.  Currently he is feeling well and looks pretty normal on exam with the exception of his arrhythmia.  We had a lengthy discussion about treatment options.  Plan for limited work-up with labs listed below.  Additionally will continue current metoprolol for rate control and lisinopril.  Would consider cutting back on lisinopril if blood pressures get lower.  We also discussed anticoagulation.  He currently is on 81 mg of aspirin and notes that he has obnoxious bleeding and bruising.  I am not sure he is going to tolerate more aggressive anticoagulation very well however I think the ultimate determination of that should be a lengthy discussion with his primary care provider and potentially cardiology.  We will continue current regimen and check metabolic panel and TSH as below  and reassess with PCP in near future.  Handout and precautions reviewed with patient. New atrial fibrillation could be severe.  Will require more extensive work-up today and into the future.  PDMP not reviewed this encounter. Orders Placed This Encounter  Procedures  . COMPLETE METABOLIC PANEL WITH GFR  . TSH   No orders of the defined types were placed in this encounter.    Historical information moved to improve visibility of documentation.  Past Medical History:  Diagnosis Date  . Arthritis  09/08/2015  . CAD (coronary artery disease) 09/07/2015   S/P RCA stent x2, Hx MI, (+)ACEI, Statin, BB, ASA - 10/2012   . Essential hypertension 09/07/2015  . History of kidney stones 09/20/2015   11/2013 68mm stone  . Hyperlipidemia 09/07/2015  . Osteopenia 10/29/2015   FRAX Score: 10 year risk of major osteoporotic fracture 8.9%, hip fracture 3.2%, prescription treatment is indicated, called in VitD/Ca, +/- bisphosphanate   . Screening for AAA (aortic abdominal aneurysm) 09/20/2015   Neg 06/11/15 per record review  . Seasonal allergies 09/07/2015   Past Surgical History:  Procedure Laterality Date  . CARDIAC SURGERY Bilateral    2 stints 1- left side  & 1 right side   Social History   Tobacco Use  . Smoking status: Former Smoker    Packs/day: 1.00    Years: 50.00    Pack years: 50.00    Types: Cigarettes    Start date: 11/16/2012  . Smokeless tobacco: Never Used  . Tobacco comment: Encouraged to remain smoke free  Substance Use Topics  . Alcohol use: No    Alcohol/week: 0.0 standard drinks   family history is not on file.  Medications: Current Outpatient Medications  Medication Sig Dispense Refill  . aspirin 81 MG tablet Take 1 tablet (81 mg total) by mouth daily. 90 tablet 3  . atorvastatin (LIPITOR) 10 MG tablet TAKE 1 TABLET DAILY 90 tablet 4  . Calcium Carbonate-Vitamin D 600-400 MG-UNIT chew tablet Chew 2 tablets by mouth daily. 180 tablet 3  . lisinopril (PRINIVIL,ZESTRIL) 5 MG tablet Take 1 tablet (5 mg total) by mouth daily. 90 tablet 3  . metoprolol succinate (TOPROL XL) 25 MG 24 hr tablet Take 0.5 tablets (12.5 mg total) by mouth daily. 45 tablet 3  . montelukast (SINGULAIR) 10 MG tablet Take 1 tablet (10 mg total) by mouth daily. 90 tablet 3  . Multiple Vitamins-Minerals (AIRBORNE PO) Take by mouth daily.    . TURMERIC PO Take by mouth.     No current facility-administered medications for this visit.    No Known Allergies   Discussed warning signs or symptoms.  Please see discharge instructions. Patient expresses understanding.

## 2019-04-17 LAB — COMPLETE METABOLIC PANEL WITH GFR
AG Ratio: 1.6 (calc) (ref 1.0–2.5)
ALT: 8 U/L — ABNORMAL LOW (ref 9–46)
AST: 13 U/L (ref 10–35)
Albumin: 3.9 g/dL (ref 3.6–5.1)
Alkaline phosphatase (APISO): 57 U/L (ref 35–144)
BUN/Creatinine Ratio: 19 (calc) (ref 6–22)
BUN: 26 mg/dL — ABNORMAL HIGH (ref 7–25)
CO2: 26 mmol/L (ref 20–32)
Calcium: 9.2 mg/dL (ref 8.6–10.3)
Chloride: 102 mmol/L (ref 98–110)
Creat: 1.34 mg/dL — ABNORMAL HIGH (ref 0.70–1.18)
GFR, Est African American: 58 mL/min/{1.73_m2} — ABNORMAL LOW (ref >=60)
GFR, Est Non African American: 50 mL/min/{1.73_m2} — ABNORMAL LOW (ref >=60)
Globulin: 2.5 g/dL (calc) (ref 1.9–3.7)
Glucose, Bld: 89 mg/dL (ref 65–99)
Potassium: 4.8 mmol/L (ref 3.5–5.3)
Sodium: 137 mmol/L (ref 135–146)
Total Bilirubin: 0.6 mg/dL (ref 0.2–1.2)
Total Protein: 6.4 g/dL (ref 6.1–8.1)

## 2019-04-17 LAB — TSH: TSH: 1.67 mIU/L (ref 0.40–4.50)

## 2019-04-22 ENCOUNTER — Encounter: Payer: Self-pay | Admitting: Osteopathic Medicine

## 2019-05-02 NOTE — Addendum Note (Signed)
Addended by: Alena Bills R on: 05/02/2019 09:56 AM   Modules accepted: Orders

## 2019-05-23 DIAGNOSIS — R55 Syncope and collapse: Secondary | ICD-10-CM | POA: Diagnosis not present

## 2019-05-23 DIAGNOSIS — I10 Essential (primary) hypertension: Secondary | ICD-10-CM | POA: Diagnosis not present

## 2019-05-23 DIAGNOSIS — I4891 Unspecified atrial fibrillation: Secondary | ICD-10-CM | POA: Diagnosis not present

## 2019-05-23 DIAGNOSIS — R918 Other nonspecific abnormal finding of lung field: Secondary | ICD-10-CM | POA: Diagnosis not present

## 2019-05-23 DIAGNOSIS — Z955 Presence of coronary angioplasty implant and graft: Secondary | ICD-10-CM | POA: Diagnosis not present

## 2019-05-23 DIAGNOSIS — R600 Localized edema: Secondary | ICD-10-CM | POA: Diagnosis not present

## 2019-05-23 DIAGNOSIS — I252 Old myocardial infarction: Secondary | ICD-10-CM | POA: Diagnosis not present

## 2019-05-23 DIAGNOSIS — R0902 Hypoxemia: Secondary | ICD-10-CM | POA: Diagnosis not present

## 2019-05-23 DIAGNOSIS — R000 Tachycardia, unspecified: Secondary | ICD-10-CM | POA: Diagnosis not present

## 2019-05-23 DIAGNOSIS — F17290 Nicotine dependence, other tobacco product, uncomplicated: Secondary | ICD-10-CM | POA: Diagnosis not present

## 2019-05-23 DIAGNOSIS — W19XXXA Unspecified fall, initial encounter: Secondary | ICD-10-CM | POA: Diagnosis not present

## 2019-05-23 DIAGNOSIS — R531 Weakness: Secondary | ICD-10-CM | POA: Diagnosis not present

## 2019-05-23 MED ORDER — SODIUM CHLORIDE 0.9 % IV SOLN
10.00 | INTRAVENOUS | Status: DC
Start: ? — End: 2019-05-23

## 2019-05-23 MED ORDER — GENERIC EXTERNAL MEDICATION
Status: DC
Start: ? — End: 2019-05-23

## 2019-07-21 ENCOUNTER — Other Ambulatory Visit: Payer: Self-pay

## 2019-07-28 ENCOUNTER — Ambulatory Visit: Payer: Medicare Other | Admitting: Family Medicine

## 2019-10-20 ENCOUNTER — Ambulatory Visit (INDEPENDENT_AMBULATORY_CARE_PROVIDER_SITE_OTHER): Payer: Medicare Other | Admitting: Physician Assistant

## 2019-10-20 ENCOUNTER — Encounter: Payer: Self-pay | Admitting: Physician Assistant

## 2019-10-20 VITALS — BP 136/70 | HR 87 | Temp 98.1°F | Ht 74.0 in | Wt 235.0 lb

## 2019-10-20 DIAGNOSIS — R059 Cough, unspecified: Secondary | ICD-10-CM | POA: Insufficient documentation

## 2019-10-20 DIAGNOSIS — I2510 Atherosclerotic heart disease of native coronary artery without angina pectoris: Secondary | ICD-10-CM

## 2019-10-20 DIAGNOSIS — J329 Chronic sinusitis, unspecified: Secondary | ICD-10-CM

## 2019-10-20 DIAGNOSIS — R05 Cough: Secondary | ICD-10-CM

## 2019-10-20 DIAGNOSIS — Z87891 Personal history of nicotine dependence: Secondary | ICD-10-CM

## 2019-10-20 DIAGNOSIS — J40 Bronchitis, not specified as acute or chronic: Secondary | ICD-10-CM

## 2019-10-20 DIAGNOSIS — I2584 Coronary atherosclerosis due to calcified coronary lesion: Secondary | ICD-10-CM

## 2019-10-20 DIAGNOSIS — R509 Fever, unspecified: Secondary | ICD-10-CM | POA: Diagnosis not present

## 2019-10-20 DIAGNOSIS — R0602 Shortness of breath: Secondary | ICD-10-CM | POA: Diagnosis not present

## 2019-10-20 DIAGNOSIS — R0781 Pleurodynia: Secondary | ICD-10-CM | POA: Diagnosis not present

## 2019-10-20 MED ORDER — AZITHROMYCIN 250 MG PO TABS: ORAL_TABLET | ORAL | 0 refills | Status: DC

## 2019-10-20 MED ORDER — PREDNISONE 50 MG PO TABS: ORAL_TABLET | ORAL | 0 refills | Status: DC

## 2019-10-20 MED ORDER — ALBUTEROL SULFATE HFA 108 (90 BASE) MCG/ACT IN AERS: 2.0000 | INHALATION_SPRAY | Freq: Four times a day (QID) | RESPIRATORY_TRACT | 0 refills | Status: DC | PRN

## 2019-10-20 NOTE — Progress Notes (Deleted)
4-5 day history Pain left rib cage in back - cough Shortness of breath Fever 100.8 - 3 days ago  Has tried tylenol - helps, but doesn't completely take it away  vaping stopped Thursday.   When moves around SOB.   Wears mask.  No loss of smell or taste.  No appetitie  No injury other than 3 weeks ago.  Cough during the sleep.   Covid   Not using left arm-  Does not hurt touch.

## 2019-10-20 NOTE — Progress Notes (Signed)
Patient ID: Timothy [name], male   DOB: Jun 06, 1941, 78 y.o.   MRN: [id number] .Marland KitchenVirtual Visit via Telephone Note  I connected with Timothy [name] on 10/21/19 at  3:20 PM EST by telephone and verified that I am speaking with the correct person using two identifiers.  Location: Patient: home Provider: clinic   I discussed the limitations, risks, security and privacy concerns of performing an evaluation and management service by telephone and the availability of in person appointments. I also discussed with the patient that there may be a patient responsible charge related to this service. The patient expressed understanding and agreed to proceed.   History of Present Illness: Pt is a 78 yo male with A.fib, CAD, HTN who calls into the clinic with left rib pain and cough. Pts wife does all of the talking with patient heard in background answering questions. Pt has a 4-5 day hx of pain the left rib area in the back. He does admit to being SOB and coughing. SOB is worse when he is trying to move around. His cough is worse at night and laying down.  He has been avoiding moving left arm because it causes his left flank/back/ribs to hurt. Does not hurt to touch over area. Pt is a former smoker and just stopped vaping 2-3 days ago. He had a low grade fever 3 days ago over 100.8 and continues to have intermittent chills. He has tried tylenol and helps some but does not take it away. 3 weeks ago he fell on his left side but did not feel any pain until 5 days ago. Pt denies any GI issues, loss of smell or taste, body aches. He does not have an appetite. No other sick contacts and no direct covid contact.   Active Ambulatory Problems    Diagnosis Date Noted  . Essential hypertension 09/07/2015  . Seasonal allergies 09/07/2015  . Hyperlipidemia 09/07/2015  . CAD (coronary artery disease) 09/07/2015  . Arthritis 09/08/2015  . History of kidney stones 09/20/2015  . Screening for AAA (aortic abdominal aneurysm)  09/20/2015  . Osteopenia 10/29/2015  . Coronary atherosclerosis 01/31/2017  . Encounter for physical examination of prospective foster parent 02/06/2019  . Atrial fibrillation ([place]) 04/16/2019  . Senile purpura ([place]) 04/16/2019  . Rib pain on left side 10/20/2019  . Fever 10/20/2019  . Cough 10/20/2019  . Former smoker 10/20/2019   Resolved Ambulatory Problems    Diagnosis Date Noted  . No Resolved Ambulatory Problems   No Additional Past Medical History   Reviewed med, allergy, problem list.      Observations/Objective: No acute distress. Dry to productive hacking cough heard over the phone.  No labored breathing.  Normal mood.   .. Today's Vitals   10/20/19 1501  BP: 136/70  Pulse: 87  Temp: 98.1 F (36.7 C)  TempSrc: Oral  Weight: 235 lb (106.6 kg)  Height: 6\' 2"  (1.88 m)   Body mass index is 30.17 kg/m.   Assessment and Plan: .Marland KitchenJohn was seen today for cough and chest pain.  Diagnoses and all orders for this visit:  Cough -     albuterol (VENTOLIN HFA) 108 (90 Base) MCG/ACT inhaler; Inhale 2 puffs into the lungs every 6 (six) hours as needed for wheezing or shortness of breath.  Fever, unspecified fever cause  Rib pain on left side  Former smoker  Sinobronchitis -     azithromycin (ZITHROMAX) 250 MG tablet; Take 2 tablets now and then one tablet for 4 days. -  predniSONE (DELTASONE) 50 MG tablet; One tab PO daily for 5 days. -     albuterol (VENTOLIN HFA) 108 (90 Base) MCG/ACT inhaler; Inhale 2 puffs into the lungs every 6 (six) hours as needed for wheezing or shortness of breath.  SOB (shortness of breath) -     predniSONE (DELTASONE) 50 MG tablet; One tab PO daily for 5 days. -     albuterol (VENTOLIN HFA) 108 (90 Base) MCG/ACT inhaler; Inhale 2 puffs into the lungs every 6 (six) hours as needed for wheezing or shortness of breath.   Unclear etiology. Can not rule out covid. Pt needs covid testing. If covid negative patient need CXR. Certainly  suspicious of some underlying COPD with his smoking history. His cough could represent an exacerbation. Sent zpak/prednisone/albuterol to pharmacy to start. Recommended patient come in to see PCP for spirometry in the near future and further evaluate SOB. Pt reported worsening of symptoms at night could benefit from overnight O2 study. Certainly the cough could have caused some costochondritis as well. Use icy hot patches over painful area. If suddenly worsens or trouble breathing worsens go to ED.    Follow Up Instructions:    I discussed the assessment and treatment plan with the patient. The patient was provided an opportunity to ask questions and all were answered. The patient agreed with the plan and demonstrated an understanding of the instructions.   The patient was advised to call back or seek an in-person evaluation if the symptoms worsen or if the condition fails to improve as anticipated.  I provided 15 minutes of non-face-to-face time during this encounter.   Iran Planas, PA-C

## 2019-10-28 ENCOUNTER — Encounter: Payer: Self-pay | Admitting: Osteopathic Medicine

## 2019-10-28 ENCOUNTER — Other Ambulatory Visit: Payer: Self-pay

## 2019-10-28 ENCOUNTER — Ambulatory Visit (INDEPENDENT_AMBULATORY_CARE_PROVIDER_SITE_OTHER): Payer: Medicare Other | Admitting: Osteopathic Medicine

## 2019-10-28 ENCOUNTER — Ambulatory Visit (INDEPENDENT_AMBULATORY_CARE_PROVIDER_SITE_OTHER): Payer: Medicare Other

## 2019-10-28 VITALS — BP 136/75 | HR 107 | Temp 98.8°F | Wt 220.0 lb

## 2019-10-28 DIAGNOSIS — Z87891 Personal history of nicotine dependence: Secondary | ICD-10-CM | POA: Diagnosis not present

## 2019-10-28 DIAGNOSIS — Z87898 Personal history of other specified conditions: Secondary | ICD-10-CM | POA: Diagnosis not present

## 2019-10-28 DIAGNOSIS — J90 Pleural effusion, not elsewhere classified: Secondary | ICD-10-CM | POA: Diagnosis not present

## 2019-10-28 DIAGNOSIS — I2584 Coronary atherosclerosis due to calcified coronary lesion: Secondary | ICD-10-CM

## 2019-10-28 DIAGNOSIS — K219 Gastro-esophageal reflux disease without esophagitis: Secondary | ICD-10-CM | POA: Diagnosis not present

## 2019-10-28 DIAGNOSIS — G4710 Hypersomnia, unspecified: Secondary | ICD-10-CM | POA: Diagnosis not present

## 2019-10-28 DIAGNOSIS — R0600 Dyspnea, unspecified: Secondary | ICD-10-CM | POA: Diagnosis not present

## 2019-10-28 DIAGNOSIS — I480 Paroxysmal atrial fibrillation: Secondary | ICD-10-CM | POA: Diagnosis not present

## 2019-10-28 DIAGNOSIS — I2510 Atherosclerotic heart disease of native coronary artery without angina pectoris: Secondary | ICD-10-CM | POA: Diagnosis not present

## 2019-10-28 DIAGNOSIS — R0609 Other forms of dyspnea: Secondary | ICD-10-CM

## 2019-10-28 DIAGNOSIS — R142 Eructation: Secondary | ICD-10-CM | POA: Diagnosis not present

## 2019-10-28 NOTE — Progress Notes (Signed)
HPI: Timothy [name] is a 78 y.o. male who  has a past medical history of Arthritis (09/08/2015), CAD (coronary artery disease) (09/07/2015), Essential hypertension (09/07/2015), History of kidney stones (09/20/2015), Hyperlipidemia (09/07/2015), Osteopenia (10/29/2015), Screening for AAA (aortic abdominal aneurysm) (09/20/2015), and Seasonal allergies (09/07/2015).  he presents to [facility] today, 10/28/19,  for chief complaint of:  SOB  I spoke with the patient and his wife today around 2:20 PM via virtual visit/phone visit and advised him to come on into the office given that he is feeling a bit worse.  Main issue is significant weakness, dyspnea on exertion, and hypersomnia worsening over the past couple of weeks, may be 3 weeks in total.  He also reports some left-sided rib pain but this seems to be improving a little bit, he was seen via phone visit by one of my colleagues a couple of days ago for same and treated with albuterol, azithromycin, prednisone.  He states the albuterol is helping with the shortness of breath when he uses it.  He is not having any cough.  Associated symptoms include significant gurgling/belching especially in his sleep.  He has lost some appetite and reports acid reflux issues    Patient is accompanied by wife Gae Bon "LaRae" Lerro who assists with history-taking.    At today's visit 10/28/19 ... PMH, PSH, FH reviewed and updated as needed.  Current medication list and allergy/intolerance hx reviewed and updated as needed. (See remainder of HPI, ROS, Phys Exam below)   Dg Chest 2 View  Result Date: 10/28/2019 CLINICAL DATA:  Dyspnea on exertion.  Former smoker. EXAM: CHEST - 2 VIEW COMPARISON:  CT chest 01/30/2018 FINDINGS: Interval development of moderate left effusion. Left lower lobe consolidation is present. Left lung base was clear previously. Right lung is clear.  Negative for heart failure or edema. IMPRESSION: Interval  development of moderate left effusion and left lower lobe consolidation. Differential includes pneumonia and malignancy. Close follow-up is warranted. Thoracentesis may be helpful for diagnosis. Electronically Signed   By: Franchot Gallo M.D.   On: 10/28/2019 15:13   Images reviewed personally w/ patient and wife   No results found for this or any previous visit (from the past 49 hour(s)).        ASSESSMENT/PLAN: The primary encounter diagnosis was Pleural effusion. Diagnoses of Dyspnea on exertion, History of progressive weakness, Hypersomnia, Belching, Gastroesophageal reflux disease, unspecified whether esophagitis present, Former smoker, and Paroxysmal atrial fibrillation ([place]) were also pertinent to this visit.  Thoracentesis to be scheduled  VS stable ER precautions reviewed Await labs - +/- ER based on these   Probably will need CT chest - former smoker   Orders Placed This Encounter  Procedures  . DG Chest 2 View  . CBC W/ DIFF.  Marland Kitchen COMPLETE METABOLIC PANEL WITH GFR  . TSH  . Urinalysis, Routine w reflex microscopic  . MAG  . Ambulatory referral to Interventional Radiology  . EKG 12-Lead  . Rhythm ECG, report         Follow-up plan: Return for RECHECK PENDING RESULTS / IF WORSE OR CHANGE.                                                 ################################################# ################################################# ################################################# #################################################    Current Meds  Medication Sig  . albuterol (VENTOLIN HFA)  108 (90 Base) MCG/ACT inhaler Inhale 2 puffs into the lungs every 6 (six) hours as needed for wheezing or shortness of breath.  Marland Kitchen aspirin 81 MG tablet Take 1 tablet (81 mg total) by mouth daily.  Marland Kitchen azithromycin (ZITHROMAX) 250 MG tablet Take 2 tablets now and then one tablet for 4 days.  . Calcium Carbonate-Vitamin D 600-400  MG-UNIT chew tablet Chew 2 tablets by mouth daily.  Marland Kitchen lisinopril (PRINIVIL,ZESTRIL) 5 MG tablet Take 1 tablet (5 mg total) by mouth daily.  . metoprolol succinate (TOPROL XL) 25 MG 24 hr tablet Take 0.5 tablets (12.5 mg total) by mouth daily.  . montelukast (SINGULAIR) 10 MG tablet Take 1 tablet (10 mg total) by mouth daily.  . Multiple Vitamins-Minerals (AIRBORNE PO) Take by mouth daily.  . predniSONE (DELTASONE) 50 MG tablet One tab PO daily for 5 days.  . TURMERIC PO Take by mouth.    No Known Allergies     Review of Systems:  Constitutional: +recent illness, no fever/chills  HEENT: No  headache, no vision change  Cardiac: No  chest pain, +pressure, No palpitations  Respiratory:  +shortness of breath. No  Cough  Gastrointestinal: No  abdominal pain, no change on bowel habits  Musculoskeletal: No new myalgia/arthralgia  Skin: No  Rash  Neurologic: No  weakness, No  Dizziness  Psychiatric: No  concerns with depression, No  concerns with anxiety  Exam:  BP (!) 104/53 (Patient Position: Sitting, Cuff Size: Normal)   Pulse 94   Temp 97.9 F (36.6 C) (Oral)   Wt 220 lb (99.8 kg)   BMI 28.25 kg/m   Constitutional: VS see above. General Appearance: alert, well-developed, well-nourished, NAD  Eyes: Normal lids and conjunctive, non-icteric sclera  Ears, Nose, Mouth, Throat: MMM, Normal external inspection ears/nares/mouth/lips/gums.  Neck: No masses, trachea midline.   Respiratory: Normal respiratory effort. Absent breath sounds on LLL area, rales LML area, no wheeze, no rhonchi, no rales  Cardiovascular: S1/S2 normal, no murmur, no rub/gallop auscultated. RRR.   Musculoskeletal: Gait normal. Symmetric and independent movement of all extremities  Neurological: Normal balance/coordination. No tremor.  Skin: warm, dry, intact.   Psychiatric: Normal judgment/insight. Normal mood and affect. Oriented x3.       Visit summary with medication list and pertinent  instructions was printed for patient to review, patient was advised to alert [place] if any updates are needed. All questions at time of visit were answered - patient instructed to contact office with any additional concerns. ER/RTC precautions were reviewed with the patient and understanding verbalized.   Note: Total time spent 40 minutes, greater than 50% of the visit was spent face-to-face counseling and coordinating care for the following: The primary encounter diagnosis was Dyspnea on exertion. Diagnoses of History of progressive weakness, Hypersomnia, Belching, Gastroesophageal reflux disease, unspecified whether esophagitis present, Former smoker, and Paroxysmal atrial fibrillation ([place]) were also pertinent to this visit.Marland Kitchen  Please note: voice recognition software was used to produce this document, and typos may escape review. Please contact Dr. Sheppard Coil for any needed clarifications.    Follow up plan: Return for RECHECK PENDING RESULTS / IF WORSE OR CHANGE.

## 2019-10-29 ENCOUNTER — Telehealth: Payer: Self-pay

## 2019-10-29 DIAGNOSIS — Z87891 Personal history of nicotine dependence: Secondary | ICD-10-CM | POA: Diagnosis not present

## 2019-10-29 DIAGNOSIS — H9190 Unspecified hearing loss, unspecified ear: Secondary | ICD-10-CM | POA: Diagnosis present

## 2019-10-29 DIAGNOSIS — I480 Paroxysmal atrial fibrillation: Secondary | ICD-10-CM | POA: Diagnosis present

## 2019-10-29 DIAGNOSIS — Z7982 Long term (current) use of aspirin: Secondary | ICD-10-CM | POA: Diagnosis not present

## 2019-10-29 DIAGNOSIS — Z79899 Other long term (current) drug therapy: Secondary | ICD-10-CM | POA: Diagnosis not present

## 2019-10-29 DIAGNOSIS — I10 Essential (primary) hypertension: Secondary | ICD-10-CM | POA: Diagnosis not present

## 2019-10-29 DIAGNOSIS — J168 Pneumonia due to other specified infectious organisms: Secondary | ICD-10-CM | POA: Diagnosis not present

## 2019-10-29 DIAGNOSIS — J90 Pleural effusion, not elsewhere classified: Secondary | ICD-10-CM | POA: Diagnosis not present

## 2019-10-29 DIAGNOSIS — R7989 Other specified abnormal findings of blood chemistry: Secondary | ICD-10-CM | POA: Diagnosis not present

## 2019-10-29 DIAGNOSIS — K8020 Calculus of gallbladder without cholecystitis without obstruction: Secondary | ICD-10-CM | POA: Diagnosis not present

## 2019-10-29 DIAGNOSIS — J189 Pneumonia, unspecified organism: Secondary | ICD-10-CM | POA: Diagnosis not present

## 2019-10-29 DIAGNOSIS — J918 Pleural effusion in other conditions classified elsewhere: Secondary | ICD-10-CM | POA: Diagnosis present

## 2019-10-29 DIAGNOSIS — J986 Disorders of diaphragm: Secondary | ICD-10-CM | POA: Diagnosis not present

## 2019-10-29 DIAGNOSIS — Z955 Presence of coronary angioplasty implant and graft: Secondary | ICD-10-CM | POA: Diagnosis not present

## 2019-10-29 DIAGNOSIS — I2510 Atherosclerotic heart disease of native coronary artery without angina pectoris: Secondary | ICD-10-CM | POA: Diagnosis not present

## 2019-10-29 DIAGNOSIS — N3289 Other specified disorders of bladder: Secondary | ICD-10-CM | POA: Diagnosis not present

## 2019-10-29 DIAGNOSIS — J9811 Atelectasis: Secondary | ICD-10-CM | POA: Diagnosis not present

## 2019-10-29 DIAGNOSIS — I252 Old myocardial infarction: Secondary | ICD-10-CM | POA: Diagnosis not present

## 2019-10-29 DIAGNOSIS — J439 Emphysema, unspecified: Secondary | ICD-10-CM | POA: Diagnosis not present

## 2019-10-29 LAB — URINALYSIS, ROUTINE W REFLEX MICROSCOPIC
Bacteria, UA: NONE SEEN /HPF
Bilirubin Urine: NEGATIVE
Glucose, UA: NEGATIVE
Hgb urine dipstick: NEGATIVE
Hyaline Cast: NONE SEEN /LPF
Nitrite: NEGATIVE
Protein, ur: NEGATIVE
Specific Gravity, Urine: 1.026 (ref 1.001–1.03)
pH: 5.5 (ref 5.0–8.0)

## 2019-10-29 LAB — COMPLETE METABOLIC PANEL WITH GFR
AG Ratio: 1.1 (calc) (ref 1.0–2.5)
ALT: 70 U/L — ABNORMAL HIGH (ref 9–46)
AST: 32 U/L (ref 10–35)
Albumin: 3.1 g/dL — ABNORMAL LOW (ref 3.6–5.1)
Alkaline phosphatase (APISO): 149 U/L — ABNORMAL HIGH (ref 35–144)
BUN/Creatinine Ratio: 29 (calc) — ABNORMAL HIGH (ref 6–22)
BUN: 27 mg/dL — ABNORMAL HIGH (ref 7–25)
CO2: 29 mmol/L (ref 20–32)
Calcium: 9 mg/dL (ref 8.6–10.3)
Chloride: 101 mmol/L (ref 98–110)
Creat: 0.93 mg/dL (ref 0.70–1.18)
GFR, Est African American: 90 mL/min/{1.73_m2} (ref >=60)
GFR, Est Non African American: 78 mL/min/{1.73_m2} (ref >=60)
Globulin: 2.9 g/dL (calc) (ref 1.9–3.7)
Glucose, Bld: 105 mg/dL — ABNORMAL HIGH (ref 65–99)
Potassium: 5.1 mmol/L (ref 3.5–5.3)
Sodium: 139 mmol/L (ref 135–146)
Total Bilirubin: 0.8 mg/dL (ref 0.2–1.2)
Total Protein: 6 g/dL — ABNORMAL LOW (ref 6.1–8.1)

## 2019-10-29 LAB — CBC WITH DIFFERENTIAL/PLATELET
Absolute Monocytes: 1645 cells/uL — ABNORMAL HIGH (ref 200–950)
Basophils Absolute: 53 cells/uL (ref 0–200)
Basophils Relative: 0.3 %
Eosinophils Absolute: 88 cells/uL (ref 15–500)
Eosinophils Relative: 0.5 %
HCT: 36.2 % — ABNORMAL LOW (ref 38.5–50.0)
Hemoglobin: 12.2 g/dL — ABNORMAL LOW (ref 13.2–17.1)
Lymphs Abs: 3483 cells/uL (ref 850–3900)
MCH: 30.3 pg (ref 27.0–33.0)
MCHC: 33.7 g/dL (ref 32.0–36.0)
MCV: 90 fL (ref 80.0–100.0)
MPV: 10.3 fL (ref 7.5–12.5)
Monocytes Relative: 9.4 %
Neutro Abs: 12233 cells/uL — ABNORMAL HIGH (ref 1500–7800)
Neutrophils Relative %: 69.9 %
Platelets: 444 10*3/uL — ABNORMAL HIGH (ref 140–400)
RBC: 4.02 10*6/uL — ABNORMAL LOW (ref 4.20–5.80)
RDW: 11.7 % (ref 11.0–15.0)
Total Lymphocyte: 19.9 %
WBC: 17.5 10*3/uL — ABNORMAL HIGH (ref 3.8–10.8)

## 2019-10-29 LAB — TSH: TSH: 1.31 mIU/L (ref 0.40–4.50)

## 2019-10-29 LAB — MAGNESIUM: Magnesium: 2.2 mg/dL (ref 1.5–2.5)

## 2019-10-29 MED ORDER — LEVOFLOXACIN 750 MG PO TABS: 750.0000 mg | ORAL_TABLET | Freq: Every day | ORAL | 0 refills | Status: DC

## 2019-10-29 NOTE — Telephone Encounter (Signed)
Noted, thanks. If he doesn't have anything on the schedule, Let's call interventional radiology tomorrow and ensure he's been set called! Pt knows to go to ER if worse

## 2019-10-29 NOTE — Telephone Encounter (Signed)
Pt's wife stopped by front desk this afternoon. Pt still has not heard back for scheduling thoracentesis. Pt's wife has been given office contact number to call and set up appt. Pt's wife also informed that rx was sent and confirmed that it was rec'd by the pharmacy. No other inquiries.

## 2019-10-29 NOTE — Addendum Note (Signed)
Addended by: Maryla Morrow on: 10/29/2019 09:43 AM   Modules accepted: Orders

## 2019-10-29 NOTE — Telephone Encounter (Signed)
Pt's wife left a vm msg stating that abx was not at the pharmacy. She also mentioned that pt has not been contacted to schedule thoracentesis. Requesting an update. Pls advise, thanks.

## 2019-10-31 ENCOUNTER — Telehealth: Payer: Self-pay

## 2019-10-31 NOTE — Telephone Encounter (Signed)
Spoke w/ Dr Gale Journey, she let me know that patient was being discharged in stable condition, he and wife have received precautions with regard to return to hospital, would probably recommend going to [facility] rather than [facility] given limited availability of necessary specialists at the [facility].

## 2019-10-31 NOTE — Telephone Encounter (Signed)
Dr. Gale Journey left a vm msg requesting a call back from provider. She stated that she wants to give provider an update regarding Thoracentesis procedure. She also mentioned she has made several attempts to reach provider via calls. Please call her [phone]. Thanks.

## 2019-11-01 MED ORDER — ASPIRIN 81 MG PO CHEW
81.00 | CHEWABLE_TABLET | ORAL | Status: DC
Start: 2019-11-01 — End: 2019-11-01

## 2019-11-01 MED ORDER — MELATONIN 1 MG PO TABS
2.00 | ORAL_TABLET | ORAL | Status: DC
Start: 2019-10-31 — End: 2019-11-01

## 2019-11-01 MED ORDER — GENERIC EXTERNAL MEDICATION
3.38 | Status: DC
Start: 2019-10-31 — End: 2019-11-01

## 2019-11-01 MED ORDER — METOPROLOL SUCCINATE ER 25 MG PO TB24
12.50 | ORAL_TABLET | ORAL | Status: DC
Start: 2019-11-01 — End: 2019-11-01

## 2019-11-03 ENCOUNTER — Other Ambulatory Visit: Payer: Self-pay

## 2019-11-03 ENCOUNTER — Ambulatory Visit (INDEPENDENT_AMBULATORY_CARE_PROVIDER_SITE_OTHER): Payer: Medicare Other | Admitting: Osteopathic Medicine

## 2019-11-03 ENCOUNTER — Telehealth: Payer: Self-pay | Admitting: Internal Medicine

## 2019-11-03 ENCOUNTER — Encounter: Payer: Self-pay | Admitting: Osteopathic Medicine

## 2019-11-03 VITALS — BP 115/74 | HR 120 | Temp 98.7°F | Wt 224.1 lb

## 2019-11-03 DIAGNOSIS — J948 Other specified pleural conditions: Secondary | ICD-10-CM | POA: Diagnosis not present

## 2019-11-03 DIAGNOSIS — J90 Pleural effusion, not elsewhere classified: Secondary | ICD-10-CM | POA: Diagnosis not present

## 2019-11-03 DIAGNOSIS — I2510 Atherosclerotic heart disease of native coronary artery without angina pectoris: Secondary | ICD-10-CM

## 2019-11-03 DIAGNOSIS — I480 Paroxysmal atrial fibrillation: Secondary | ICD-10-CM

## 2019-11-03 DIAGNOSIS — I2584 Coronary atherosclerosis due to calcified coronary lesion: Secondary | ICD-10-CM

## 2019-11-03 NOTE — Telephone Encounter (Signed)
ATC Nina back. She did not answer and I could not leave a VM. We can access Dr. Redgie Grayer records from [place]. Will close encounter.

## 2019-11-03 NOTE — Progress Notes (Signed)
HPI: Timothy [name] is a 78 y.o. male who  has a past medical history of Arthritis (09/08/2015), CAD (coronary artery disease) (09/07/2015), Essential hypertension (09/07/2015), History of kidney stones (09/20/2015), Hyperlipidemia (09/07/2015), Osteopenia (10/29/2015), Screening for AAA (aortic abdominal aneurysm) (09/20/2015), and Seasonal allergies (09/07/2015).  he presents to [facility] today, 11/03/19,  for chief complaint of:  Hospital follow-up: Pleural effusion  Pleural effusion was not able to be fully evacuated per the patient, he has a follow-up with CT surgery to place what sounds like a chest tube/drain.  Pleural fluid seems most likely due to malignancy, though no obvious primary was noted on imaging, CT images are not available for review it is unclear if there are some pleural Calcifications at the base of the lung tissue or diaphragmatic pleura on the left side.  He does have an extensive smoking history.  About a year and a half ago, he did undergo CT chest for lung cancer screening which did not show any concerning masses, annual follow-up was recommended but he did not get that done this February     At today's visit 11/03/19 ... PMH, PSH, FH reviewed and updated as needed.  Current medication list and allergy/intolerance hx reviewed and updated as needed. (See remainder of HPI, ROS, Phys Exam below)   No results found.  No results found for this or any previous visit (from the past 72 hour(s)).        ASSESSMENT/PLAN: The primary encounter diagnosis was Pleural effusion. Diagnoses of Calcification of pleura and Paroxysmal A-fib ([place]) were also pertinent to this visit.  Patient is advised to keep follow-up with specialist, I answered all questions from him and his wife.  Overall, patient is feeling well though still short of breath.  He and his wife are in good spirits.  He is not sure that he wants to pursue any aggressive  treatment but is open to discussion with specialists.    Patient's heart sounds irregular/irregular concerning for A. fib recurrence.  Patient and I have discussed this before, he is not interested in initiating anticoagulation, he does not have any symptomatic palpitations.  Risk versus benefit of anticoagulation versus forgoing this therapy has been covered with the patient  Follow-up plan: Return for PEnding specialist visits.                                                 ################################################# ################################################# ################################################# #################################################    Current Meds  Medication Sig  . albuterol (VENTOLIN HFA) 108 (90 Base) MCG/ACT inhaler Inhale 2 puffs into the lungs every 6 (six) hours as needed for wheezing or shortness of breath.  Marland Kitchen aspirin 81 MG tablet Take 1 tablet (81 mg total) by mouth daily.  Marland Kitchen azithromycin (ZITHROMAX) 250 MG tablet Take 2 tablets now and then one tablet for 4 days.  . Calcium Carbonate-Vitamin D 600-400 MG-UNIT chew tablet Chew 2 tablets by mouth daily.  Marland Kitchen levofloxacin (LEVAQUIN) 750 MG tablet Take 1 tablet (750 mg total) by mouth daily.  Marland Kitchen lisinopril (PRINIVIL,ZESTRIL) 5 MG tablet Take 1 tablet (5 mg total) by mouth daily.  . metoprolol succinate (TOPROL XL) 25 MG 24 hr tablet Take 0.5 tablets (12.5 mg total) by mouth daily.  . montelukast (SINGULAIR) 10 MG tablet Take 1 tablet (10 mg total) by mouth daily.  . Multiple Vitamins-Minerals (  AIRBORNE PO) Take by mouth daily.  . predniSONE (DELTASONE) 50 MG tablet One tab PO daily for 5 days.  . TURMERIC PO Take by mouth.    No Known Allergies     Review of Systems:  Constitutional: No recent illness  HEENT: No  headache, no vision change  Cardiac: No  chest pain, No  pressure, No palpitations  Respiratory:  +shortness of breath. No   Cough  Musculoskeletal: No new myalgia/arthralgia  Psychiatric: No  concerns with depression, No  concerns with anxiety  Exam:  BP 115/74 (BP Location: Left Arm, Patient Position: Sitting, Cuff Size: Normal)   Pulse (!) 120   Temp 98.7 F (37.1 C) (Oral)   Wt 224 lb 1.9 oz (101.7 kg)   SpO2 95%   BMI 28.78 kg/m   Constitutional: VS see above. General Appearance: alert, well-developed, well-nourished, NAD  Eyes: Normal lids and conjunctive, non-icteric sclera  Ears, Nose, Mouth, Throat: MMM, Normal external inspection ears/nares/mouth/lips/gums.  Neck: No masses, trachea midline.   Respiratory: Normal respiratory effort. no wheeze, no rhonchi, no rales, diminished/absent breath sounds on LLL area  Cardiovascular: S1/S2 normal, no murmur, no rub/gallop auscultated. irreg/irreg.   Musculoskeletal: Gait normal. Symmetric and independent movement of all extremities  Neurological: Normal balance/coordination. No tremor.  Skin: warm, dry, intact.   Psychiatric: Normal judgment/insight. Normal mood and affect. Oriented x3.       Visit summary with medication list and pertinent instructions was printed for patient to review, patient was advised to alert [place] if any updates are needed. All questions at time of visit were answered - patient instructed to contact office with any additional concerns. ER/RTC precautions were reviewed with the patient and understanding verbalized.   Note: Total time spent 25 minutes, greater than 50% of the visit was spent face-to-face counseling and coordinating care for the following: The primary encounter diagnosis was Pleural effusion. A diagnosis of Calcification of pleura was also pertinent to this visit.Marland Kitchen  Please note: voice recognition software was used to produce this document, and typos may escape review. Please contact Dr. Sheppard Coil for any needed clarifications.    Follow up plan: Return for PEnding specialist visits.

## 2019-11-04 ENCOUNTER — Encounter: Payer: Self-pay | Admitting: Internal Medicine

## 2019-11-04 ENCOUNTER — Ambulatory Visit (INDEPENDENT_AMBULATORY_CARE_PROVIDER_SITE_OTHER): Payer: Medicare Other | Admitting: Internal Medicine

## 2019-11-04 ENCOUNTER — Ambulatory Visit (INDEPENDENT_AMBULATORY_CARE_PROVIDER_SITE_OTHER): Payer: Medicare Other

## 2019-11-04 DIAGNOSIS — I10 Essential (primary) hypertension: Secondary | ICD-10-CM | POA: Diagnosis not present

## 2019-11-04 DIAGNOSIS — I2584 Coronary atherosclerosis due to calcified coronary lesion: Secondary | ICD-10-CM

## 2019-11-04 DIAGNOSIS — I2510 Atherosclerotic heart disease of native coronary artery without angina pectoris: Secondary | ICD-10-CM

## 2019-11-04 DIAGNOSIS — J90 Pleural effusion, not elsewhere classified: Secondary | ICD-10-CM

## 2019-11-04 MED ORDER — TELMISARTAN 40 MG PO TABS: 40.0000 mg | ORAL_TABLET | Freq: Every day | ORAL | 11 refills | Status: DC

## 2019-11-04 NOTE — Assessment & Plan Note (Signed)
Cough x 2005 > d/c acei 11/04/2019   ACE inhibitors are problematic in  pts with airway complaints because  even experienced pulmonologists can't always distinguish ace effects from copd/asthma.  By themselves they don't actually cause a problem, much like oxygen can't by itself start a fire, but they certainly serve as a powerful catalyst or enhancer for any "fire"  or inflammatory process in the upper airway, be it caused by an ET  tube or more commonly reflux (especially in the obese or pts with known GERD or who are on biphoshonates).    In the era of ARB near equivalency until we have a better handle on the reversibility of the airway problem, it just makes sense to avoid ACEI  entirely in the short run and then decide later, having established a level of airway control using a reasonable limited regimen, whether to add back ace but even then being very careful to observe the pt for worsening airway control and number of meds used/ needed to control symptoms.    Rec:   Try micardis 40 mg one daily  - self monitor so  cut in half if too strong, double if too weak     Total time devoted to counseling  > 50 % of initial 60 min office visit:  reviewed case ( including care everywhere records) with pt/wife  discussion of options/alternatives/ personally creating written customized instructions  in presence of pt  then going over those specific  Instructions directly with the pt including how to use all of the meds but in particular covering each new medication in detail and the difference between the maintenance= "automatic" meds and the prns using an action plan format for the latter (If this problem/symptom => do that organization reading Left to right).  Please see AVS from this visit for a full list of these instructions which I personally wrote for this pt and  are unique to this visit.

## 2019-11-04 NOTE — Assessment & Plan Note (Addendum)
Onset of symptoms mid Oct 2020 - Tap 10/30/2019 x 400 cc exudative with glucose 64 wbc 802 P > L and inflammatory cyt  - Quant GOLD TB ordered   ddx = late parapneumonic, malignant or benign asbestos pleural effusion - pe / tb less likely     rec  Tap dry at [facility] and follow up with a CTa if d dimer pos, a ct with contrast if not  Venous dopplers if d dimer pos Check labs above

## 2019-11-04 NOTE — Patient Instructions (Addendum)
Please remember to go to the lab and x-ray department   for your tests - we will call you with the results when they are available.    We will call to schedule you a repeat thoratcentesis on Left followed by a CT chest same day so we can see the lung more clearly   Stop lisinopril and start micardis 40 mg one daily

## 2019-11-04 NOTE — Progress Notes (Addendum)
Timothy [name], male    DOB: [birth year],   MRN: [id number]   Brief patient profile:  87 yowm retired [place] [employer] Timothy [name] quit smoking 2013 and vaping about mid 09/2019 with dry cough  Since 2005 while on ACEi  New onset L ant and lower post  Chest pain mid  oct 2020   admitted 10/29/2019 and dx L Pl effusion with pleural plaque L base on CT chest  >>  thoracentesis 10/30/2019 x 400 cc serous fluid exudative with Polys > Lymphs and inflammatory cytology so rx'd as parapneumonic and self referred to pulmonary clinic.       History of Present Illness  11/04/2019  Pulmonary/ 1st office eval/Timothy [name]  Chief Complaint  Patient presents with  . Pulmonary Consult    Self referral for pleural effusion. He c/o SOB and non prod cough for the past month. He is using an albuterol inhaler 2-3 x per day  Dyspnea:  MMRC3 = can't walk 100 yards even at a slow pace at a flat grade s stopping due to sob   Cough: dry daytime and also night x years  Sleep: Has not been able to lie flat in bed back pain x 40 y SABA use: new start, does not help  L chest pain with cough is better as is sob p tcentesis  No obvious day to day or daytime variability or assoc excess/ purulent sputum or mucus plugs or hemoptysis  or chest tightness, subjective wheeze or overt sinus or hb symptoms.   Sleeping now at 30 -45 degrees in recliner = baseline x 40 y  without nocturnal  or early am exacerbation  of respiratory  c/o's or need for noct saba. Also denies any obvious fluctuation of symptoms with weather or environmental changes or other aggravating or alleviating factors except as outlined above   No unusual exposure hx or h/o childhood pna/ asthma or knowledge of premature birth.  Current Allergies, Complete Past Medical History, Past Surgical History, Family History, and Social History were reviewed in [employer] record.  ROS  The following are not active complaints unless bolded Hoarseness, sore  throat, dysphagia, dental problems, itching, sneezing,  nasal congestion or discharge of excess mucus or purulent secretions, ear ache,   fever, chills, sweats, unintended wt loss or wt gain, classically pleuritic or exertional cp,  orthopnea pnd or arm/hand swelling  or leg swelling, presyncope, palpitations, abdominal pain, anorexia, nausea, vomiting, diarrhea  or change in bowel habits or change in bladder habits, change in stools or change in urine, dysuria, hematuria,  rash, arthralgias, visual complaints, headache, numbness, weakness or ataxia or problems with walking or coordination,  change in mood or  memory.             Past Medical History:  Diagnosis Date  . Arthritis 09/08/2015  . CAD (coronary artery disease) 09/07/2015   S/P RCA stent x2, Hx MI, (+)ACEI, Statin, BB, ASA - 10/2012   . Essential hypertension 09/07/2015  . History of kidney stones 09/20/2015   11/2013 68mm stone  . Hyperlipidemia 09/07/2015  . Osteopenia 10/29/2015   FRAX Score: 10 year risk of major osteoporotic fracture 8.9%, hip fracture 3.2%, prescription treatment is indicated, called in VitD/Ca, +/- bisphosphanate   . Screening for AAA (aortic abdominal aneurysm) 09/20/2015   Neg 06/11/15 per record review  . Seasonal allergies 09/07/2015    Outpatient Medications Prior to Visit  Medication Sig Dispense Refill  . albuterol (VENTOLIN HFA) 108 (90  Base) MCG/ACT inhaler Inhale 2 puffs into the lungs every 6 (six) hours as needed for wheezing or shortness of breath. 18 g 0  . aspirin 81 MG tablet Take 1 tablet (81 mg total) by mouth daily. 90 tablet 3  . Calcium Carbonate-Vitamin D 600-400 MG-UNIT chew tablet Chew 2 tablets by mouth daily. 180 tablet 3  . lisinopril (PRINIVIL,ZESTRIL) 5 MG tablet Take 1 tablet (5 mg total) by mouth daily. 90 tablet 3  . metoprolol succinate (TOPROL XL) 25 MG 24 hr tablet Take 0.5 tablets (12.5 mg total) by mouth daily. 45 tablet 3  . montelukast (SINGULAIR) 10 MG tablet Take 1  tablet (10 mg total) by mouth daily. 90 tablet 3  . Multiple Vitamins-Minerals (AIRBORNE PO) Take by mouth daily.    . TURMERIC PO Take by mouth.    Marland Kitchen     0  .    0  .              Objective:     BP (!) 142/70 (BP Location: Left Arm, Cuff Size: Normal)   Pulse 99   Temp (!) 97 F (36.1 C) (Temporal)   Ht 6\' 2"  (1.88 m)   Wt 226 lb (102.5 kg)   SpO2 94% Comment: on RA  BMI 29.02 kg/m   SpO2: 94 %(on RA)   amb wm very hoh    HEENT : pt wearing mask not removed for exam due to covid -19 concerns.    NECK :  without JVD/Nodes/TM/ nl carotid upstrokes bilaterally   LUNGS: no acc muscle use,  Nl contour chest with decreased bs with dullness 1/3 on L  without cough on insp or exp maneuvers   CV:  RRR  no s3 or murmur or increase in P2, and  R> L LE edema with trace pitting    ABD:  soft and nontender with nl inspiratory excursion in the supine position. No bruits or organomegaly appreciated, bowel sounds nl  MS:  Nl gait/ ext warm without deformities, calf tenderness, cyanosis or clubbing No obvious joint restrictions   SKIN: warm and dry without lesions    NEURO:  alert, approp, nl sensorium with  no motor or cerebellar deficits apparent.    CXR PA and Lateral:   11/04/2019 :    I personally reviewed images and agree with radiology impression as follows:   Moderate to large left pleural effusion with left base atelectasis.     Labs ordered 11/04/2019  :  Quant TB       Assessment   Pleural effusion on left Onset of symptoms mid Oct 2020 - Tap 10/30/2019 x 400 cc exudative with glucose 64 wbc 802 P > L and inflammatory cyt  - Quant GOLD TB ordered   ddx = late parapneumonic, malignant or benign asbestos pleural effusion - pe / tb less likely     rec  Tap dry at [facility] and follow up with a CTa if d dimer pos, a ct with contrast if not  Venous dopplers if d dimer pos Check labs above        Essential hypertension Cough x 2005 > d/c acei 11/04/2019    ACE inhibitors are problematic in  pts with airway complaints because  even experienced pulmonologists can't always distinguish ace effects from copd/asthma.  By themselves they don't actually cause a problem, much like oxygen can't by itself start a fire, but they certainly serve as a powerful catalyst or enhancer for any "fire"  or inflammatory process in the upper airway, be it caused by an ET  tube or more commonly reflux (especially in the obese or pts with known GERD or who are on biphoshonates).    In the era of ARB near equivalency until we have a better handle on the reversibility of the airway problem, it just makes sense to avoid ACEI  entirely in the short run and then decide later, having established a level of airway control using a reasonable limited regimen, whether to add back ace but even then being very careful to observe the pt for worsening airway control and number of meds used/ needed to control symptoms.    Rec:   Try micardis 40 mg one daily  - self monitor so  cut in half if too strong, double if too weak     Total time devoted to counseling  > 50 % of initial 60 min office visit:  reviewed case ( including care everywhere records) with pt/wife  discussion of options/alternatives/ personally creating written customized instructions  in presence of pt  then going over those specific  Instructions directly with the pt including how to use all of the meds but in particular covering each new medication in detail and the difference between the maintenance= "automatic" meds and the prns using an action plan format for the latter (If this problem/symptom => do that organization reading Left to right).  Please see AVS from this visit for a full list of these instructions which I personally wrote for this pt and  are unique to this visit.       [name], MD 11/04/2019

## 2019-11-05 ENCOUNTER — Encounter: Payer: Self-pay | Admitting: Internal Medicine

## 2019-11-05 LAB — CBC WITH DIFFERENTIAL/PLATELET
Basophils Absolute: 0.1 10*3/uL (ref 0.0–0.1)
Basophils Relative: 1 % (ref 0.0–3.0)
Eosinophils Absolute: 0.4 10*3/uL (ref 0.0–0.7)
Eosinophils Relative: 3.8 % (ref 0.0–5.0)
HCT: 34.4 % — ABNORMAL LOW (ref 39.0–52.0)
Hemoglobin: 11.3 g/dL — ABNORMAL LOW (ref 13.0–17.0)
Lymphocytes Relative: 19.7 % (ref 12.0–46.0)
Lymphs Abs: 2.3 10*3/uL (ref 0.7–4.0)
MCHC: 32.8 g/dL (ref 30.0–36.0)
MCV: 92.4 fl (ref 78.0–100.0)
Monocytes Absolute: 1.2 10*3/uL — ABNORMAL HIGH (ref 0.1–1.0)
Monocytes Relative: 9.9 % (ref 3.0–12.0)
Neutro Abs: 7.7 10*3/uL (ref 1.4–7.7)
Neutrophils Relative %: 65.6 % (ref 43.0–77.0)
Platelets: 389 10*3/uL (ref 150.0–400.0)
RBC: 3.72 Mil/uL — ABNORMAL LOW (ref 4.22–5.81)
RDW: 13.7 % (ref 11.5–15.5)
WBC: 11.8 10*3/uL — ABNORMAL HIGH (ref 4.0–10.5)

## 2019-11-05 LAB — BASIC METABOLIC PANEL
BUN: 23 mg/dL (ref 6–23)
CO2: 28 mEq/L (ref 19–32)
Calcium: 8.9 mg/dL (ref 8.4–10.5)
Chloride: 101 mEq/L (ref 96–112)
Creatinine, Ser: 0.84 mg/dL (ref 0.40–1.50)
GFR: 87.98 mL/min (ref >60.00)
Glucose, Bld: 99 mg/dL (ref 70–99)
Potassium: 4.3 mEq/L (ref 3.5–5.1)
Sodium: 138 mEq/L (ref 135–145)

## 2019-11-05 LAB — SEDIMENTATION RATE: Sed Rate: 116 mm/hr — ABNORMAL HIGH (ref 0–20)

## 2019-11-05 NOTE — Progress Notes (Signed)
Spoke with pt and notified of results per Dr. Wert. Pt verbalized understanding and denied any questions. 

## 2019-11-06 ENCOUNTER — Telehealth: Payer: Self-pay | Admitting: Internal Medicine

## 2019-11-06 NOTE — Telephone Encounter (Signed)
LMTCB

## 2019-11-07 ENCOUNTER — Encounter: Payer: Medicare Other | Admitting: Sports Medicine

## 2019-11-07 ENCOUNTER — Other Ambulatory Visit (HOSPITAL_COMMUNITY)
Admission: RE | Admit: 2019-11-07 | Discharge: 2019-11-07 | Disposition: A | Discharge status: Home / Self Care | Payer: Medicare Other | Source: Ambulatory Visit | Attending: Internal Medicine | Admitting: Internal Medicine

## 2019-11-07 DIAGNOSIS — Z20828 Contact with and (suspected) exposure to other viral communicable diseases: Secondary | ICD-10-CM | POA: Insufficient documentation

## 2019-11-07 DIAGNOSIS — Z01812 Encounter for preprocedural laboratory examination: Secondary | ICD-10-CM | POA: Insufficient documentation

## 2019-11-07 LAB — QUANTIFERON-TB GOLD PLUS
Mitogen-NIL: >10 IU/mL
NIL: 0.08 IU/mL
QuantiFERON-TB Gold Plus: POSITIVE — AB
TB1-NIL: 1.9 IU/mL
TB2-NIL: 1.34 IU/mL

## 2019-11-07 LAB — SARS CORONAVIRUS 2 (TAT 6-24 HRS): SARS Coronavirus 2: NEGATIVE

## 2019-11-07 NOTE — Telephone Encounter (Signed)
Attempted to call pt but unable to reach. Left message for pt to return call. 

## 2019-11-10 ENCOUNTER — Other Ambulatory Visit: Payer: Self-pay | Admitting: Internal Medicine

## 2019-11-10 ENCOUNTER — Ambulatory Visit (HOSPITAL_COMMUNITY)
Admission: RE | Admit: 2019-11-10 | Discharge: 2019-11-10 | Disposition: A | Discharge status: Home / Self Care | Payer: Medicare Other | Source: Ambulatory Visit | Attending: Internal Medicine | Admitting: Internal Medicine

## 2019-11-10 ENCOUNTER — Other Ambulatory Visit: Payer: Self-pay

## 2019-11-10 DIAGNOSIS — R05 Cough: Secondary | ICD-10-CM

## 2019-11-10 DIAGNOSIS — J9811 Atelectasis: Secondary | ICD-10-CM | POA: Diagnosis not present

## 2019-11-10 DIAGNOSIS — J90 Pleural effusion, not elsewhere classified: Secondary | ICD-10-CM

## 2019-11-10 DIAGNOSIS — I700 Atherosclerosis of aorta: Secondary | ICD-10-CM | POA: Diagnosis not present

## 2019-11-10 DIAGNOSIS — J439 Emphysema, unspecified: Secondary | ICD-10-CM | POA: Insufficient documentation

## 2019-11-10 DIAGNOSIS — J432 Centrilobular emphysema: Secondary | ICD-10-CM | POA: Diagnosis not present

## 2019-11-10 DIAGNOSIS — R059 Cough, unspecified: Secondary | ICD-10-CM

## 2019-11-10 LAB — BODY FLUID CELL COUNT WITH DIFFERENTIAL
Eos, Fluid: 1 %
Lymphs, Fluid: 67 %
Monocyte-Macrophage-Serous Fluid: 5 % — ABNORMAL LOW (ref 50–90)
Neutrophil Count, Fluid: 27 % — ABNORMAL HIGH (ref 0–25)
Total Nucleated Cell Count, Fluid: UNDETERMINED cu mm (ref 0–1000)

## 2019-11-10 LAB — GLUCOSE, PLEURAL OR PERITONEAL FLUID: Glucose, Fluid: 75 mg/dL

## 2019-11-10 MED ORDER — SODIUM CHLORIDE (PF) 0.9 % IJ SOLN
INTRAMUSCULAR | Status: AC
  Filled 2019-11-10: qty 50

## 2019-11-10 MED ORDER — LIDOCAINE HCL 1 % IJ SOLN
INTRAMUSCULAR | Status: AC
  Filled 2019-11-10: qty 10

## 2019-11-10 MED ORDER — IOHEXOL 300 MG/ML  SOLN
75.0000 mL | Freq: Once | INTRAMUSCULAR | Status: AC | PRN
  Administered 2019-11-10: 75 mL via INTRAVENOUS

## 2019-11-10 NOTE — Progress Notes (Signed)
Spoke with pt and notified of results per Dr. Melvyn Novas. Pt verbalized understanding and denied any questions. He wanted his spouse to be aware of the information and asked that I call her at [phone] and had to [facility]

## 2019-11-10 NOTE — Procedures (Signed)
PROCEDURE SUMMARY:  Small densely loculated (L)pleural effusion Successful US guided left thoracentesis yielded only 5 mL of serosanguinous fluid. Pt tolerated procedure well. No immediate complications.  Specimen was sent for labs.  EBL < 5 mL  Ascencion Dike PA-C 11/10/2019 3:57 PM

## 2019-11-10 NOTE — Progress Notes (Unsigned)
Has pos Quant Gold TB > check pleural fluid too

## 2019-11-11 ENCOUNTER — Telehealth: Payer: Self-pay

## 2019-11-11 ENCOUNTER — Ambulatory Visit (INDEPENDENT_AMBULATORY_CARE_PROVIDER_SITE_OTHER): Payer: Medicare Other | Admitting: Internal Medicine

## 2019-11-11 ENCOUNTER — Encounter: Payer: Self-pay | Admitting: Internal Medicine

## 2019-11-11 DIAGNOSIS — I2510 Atherosclerotic heart disease of native coronary artery without angina pectoris: Secondary | ICD-10-CM | POA: Diagnosis not present

## 2019-11-11 DIAGNOSIS — I2584 Coronary atherosclerosis due to calcified coronary lesion: Secondary | ICD-10-CM | POA: Diagnosis not present

## 2019-11-11 DIAGNOSIS — J90 Pleural effusion, not elsewhere classified: Secondary | ICD-10-CM | POA: Diagnosis not present

## 2019-11-11 LAB — ACID FAST SMEAR (AFB, MYCOBACTERIA): Acid Fast Smear: NEGATIVE

## 2019-11-11 NOTE — Assessment & Plan Note (Signed)
Onset of symptoms mid Oct 2020 fairly abrupt with low grade fever transiently and persistent chills since - Tap 10/30/2019 x 400 cc exudative with P > L and inflammatory cyt  - Quant GOLD TB 11/04/2019 Positive not likely to cause P > L but needs afb smear and culture added to T centesis and will be wearing a mask anyway for time being so hold off on w/u unless sputum production in which case needs to turn in 3 am samples  - retap 11/10/2019  5 cc only /severely loculated glucose 75, afb smear and culture sent  Acute onset symptoms with  Assoc fever, chills, and marked elevation of esr in a severely loculated L effusion typical of late parapneumonic process though can't rule out reactivation tb (this usually is assoc with parenchymal lung dz which is absent here - can be seen with initial infection but we know he had pos test in the 90s so this is not primary TB and does not have any sputum we can use but wearing a mask anyway due to covid 19 restrictions so rec  >>> proceed directly to T surgery to consider vats for dx and therapeutic purposes   Discussed in detail all the  indications, usual  risks and alternatives  relative to the benefits with patient/wife who agree  to proceed with w/u as outlined.      I had an extended discussion with the patient reviewing all relevant studies completed to date and  lasting 15 to 20 minutes of a 25 minute visit    Each maintenance medication was reviewed in detail including most importantly the difference between maintenance and prns and under what circumstances the prns are to be triggered using an action plan format that is not reflected in the computer generated alphabetically organized AVS.     Please see AVS for specific instructions unique to this visit that I personally wrote and verbalized to the the pt in detail and then reviewed with pt  by my nurse highlighting any  changes in therapy recommended at today's visit to their plan of care.

## 2019-11-11 NOTE — Patient Instructions (Addendum)
Only use your albuterol as a rescue medication to be used if you can't catch your breath by resting or you have chest heaviness  - The less you use it, the better it will work when you need it. - Ok to use up to 2 puffs  every 4 hours if you must but call for immediate appointment if use goes up over your usual need - Don't leave home without it !!  (think of it like the spare tire for your car)    We will to schedule you an appt with Thoracic surgery asap ([place] if you get worse in meantime)

## 2019-11-11 NOTE — Telephone Encounter (Signed)
LMTCB

## 2019-11-11 NOTE — Progress Notes (Signed)
Timothy [name], male    DOB: [birth year],   MRN: [id number]   Brief patient profile:  37 yowm retired [place] [employer] quit smoking 2013 and vaping about mid 09/2019 with dry cough  Since 2005 while on ACEi  New onset L ant and lower post  Chest pain mid  oct 2020 chills and low grade  admitted 10/29/2019 and dx L Pl effusion with pleural plaque L base on CT chest  >>  thoracentesis 10/30/2019 x 400 cc serous fluid exudative with Polys > Lymphs and inflammatory cytology so rx'd as parapneumonic and self referred to pulmonary clinic 11/04/2019      History of Present Illness  11/04/2019  Pulmonary/ 1st office eval/Kaytlynne Neace  Chief Complaint  Patient presents with  . Pulmonary Consult    Self referral for pleural effusion. He c/o SOB and non prod cough for the past month. He is using an albuterol inhaler 2-3 x per day  Dyspnea:  MMRC3 = can't walk 100 yards even at a slow pace at a flat grade s stopping due to sob   Cough: dry daytime and also night x years  Sleep: Has not been able to lie flat in bed back pain x 40 y SABA use: new start, does not help  L chest pain with cough is better as is sob p tcentesis Rec: Re tap and do ct p tap > done 11/10/23 x 5 cc only with glucose up to 75 and no evidence of lung ca   11/11/2019  f/u ov/Ayla Dunigan re:  L loculated pleural effusion  Chief Complaint  Patient presents with  . Follow-up    Discuss results of ct chest. Breathing is unchanged since the last visit.   Dyspnea:  = MMRC3 = can't walk 100 yards even at a slow pace at a flat grade s stopping due to sob   Cough: not productive at all  Sleeping: baselin 45 degrees x 40 years, now ever since oct 2020  having to sit upright SABA use:  Starts working p about 10 min helps chest congestion lasts for hours   02: no  Recurrent  R foot swelling x  Years no cahnge  Having daily chills since oct / set Temp up 78 to compensate which usually he leaves at 70 Skin test positive in [place]   1990s no sputum available but told it was  "false positive"    No obvious day to day or daytime variability or assoc excess/ purulent sputum or mucus plugs or hemoptysis or cp or   subjective wheeze or overt sinus or hb symptoms.   Sleeping  without nocturnal  or early am exacerbation  of respiratory  c/o's or need for noct saba. Also denies any obvious fluctuation of symptoms with weather or environmental changes or other aggravating or alleviating factors except as outlined above   No unusual exposure hx or h/o childhood pna/ asthma or knowledge of premature birth.  Current Allergies, Complete Past Medical History, Past Surgical History, Family History, and Social History were reviewed in [employer] record.  ROS  The following are not active complaints unless bolded Hoarseness, sore throat, dysphagia, dental problems, itching, sneezing,  nasal congestion or discharge of excess mucus or purulent secretions, ear ache,   fever, chills, sweats, unintended wt loss or wt gain, classically pleuritic or exertional cp,  orthopnea pnd or arm/hand swelling  or leg swelling, presyncope, palpitations, abdominal pain, anorexia, nausea, vomiting, diarrhea  or change in  bowel habits or change in bladder habits, change in stools or change in urine, dysuria, hematuria,  rash, arthralgias, visual complaints, headache, numbness, weakness or ataxia or problems with walking or coordination,  change in mood or  memory.        Current Meds  Medication Sig  . albuterol (VENTOLIN HFA) 108 (90 Base) MCG/ACT inhaler Inhale 2 puffs into the lungs every 6 (six) hours as needed for wheezing or shortness of breath.  Marland Kitchen aspirin 81 MG tablet Take 1 tablet (81 mg total) by mouth daily.  . Calcium Carbonate-Vitamin D 600-400 MG-UNIT chew tablet Chew 2 tablets by mouth daily.  . metoprolol succinate (TOPROL XL) 25 MG 24 hr tablet Take 0.5 tablets (12.5 mg total) by mouth daily.  . montelukast (SINGULAIR) 10  MG tablet Take 1 tablet (10 mg total) by mouth daily.  . Multiple Vitamins-Minerals (AIRBORNE PO) Take by mouth daily.  Marland Kitchen telmisartan (MICARDIS) 40 MG tablet Take 1 tablet (40 mg total) by mouth daily.  . TURMERIC PO Take by mouth.               Past Medical History:  Diagnosis Date  . Arthritis 09/08/2015  . CAD (coronary artery disease) 09/07/2015   S/P RCA stent x2, Hx MI, (+)ACEI, Statin, BB, ASA - 10/2012   . Essential hypertension 09/07/2015  . History of kidney stones 09/20/2015   11/2013 1mm stone  . Hyperlipidemia 09/07/2015  . Osteopenia 10/29/2015   FRAX Score: 10 year risk of major osteoporotic fracture 8.9%, hip fracture 3.2%, prescription treatment is indicated, called in VitD/Ca, +/- bisphosphanate   . Screening for AAA (aortic abdominal aneurysm) 09/20/2015   Neg 06/11/15 per record review  . Seasonal allergies 09/07/2015       Objective:     amb stoic wm nad  Wt Readings from Last 3 Encounters:  11/11/19 226 lb (102.5 kg)  11/04/19 226 lb (102.5 kg)  11/03/19 224 lb 1.9 oz (101.7 kg)     Vital [name] reviewed - Note on arrival 02 sats  96% on RA    HEENT : pt wearing mask not removed for exam due to covid -19 concerns.    NECK :  without JVD/Nodes/TM/ nl carotid upstrokes bilaterally   LUNGS: no acc muscle use,  Nl contour chest with decreased bs/ dullness one third up onL without cough on insp or exp maneuvers   CV:  RRR  no s3 or murmur or increase in P2, and trace to 1+ pitting r > L wearing elastic support hose  ABD:  soft and nontender with nl inspiratory excursion in the supine position. No bruits or organomegaly appreciated, bowel sounds nl  MS:  Nl gait/ ext warm without deformities, calf tenderness, cyanosis or clubbing No obvious joint restrictions   SKIN: warm and dry without lesions    NEURO:  alert, approp, nl sensorium with  no motor or cerebellar deficits apparent.            I personally reviewed images and agree with  radiology impression as follows:   Chest CT 11/10/2019 with contrast Moderate left pleural effusion. Tiny foci of gas, likely related to recent thoracentesis. No pneumothorax.  Lingular and left lower lobe atelectasis. Associated volume loss in the left hemithorax.  Aortic Atherosclerosis (ICD10-I70.0) and Emphysema (ICD10-J43.9).      Lab Results  Component Value Date   ESRSEDRATE 116 Repeated and verified X2. (H) 11/04/2019       Assessment

## 2019-11-11 NOTE — Telephone Encounter (Signed)
Pt's wife called with an update for pt. She stated that Dr. 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 Pt has been instructed to remain quarantined and to continue wearing a mask. Pt's wife is limiting outings, since pt is refusing to wear his mask. Pt is scheduled for a consultation on 11/18/19 since Thoracentesis can not be done. Gae Bon will keep provider updated as she can. No other inquiries.

## 2019-11-11 NOTE — Progress Notes (Signed)
D/w the pt and his spouse at ov with MW

## 2019-11-12 LAB — CYTOLOGY - NON PAP

## 2019-11-12 NOTE — Telephone Encounter (Signed)
Noted. Thanks.

## 2019-11-12 NOTE — Telephone Encounter (Signed)
ATC pt on both numbers. Automated voice recording stated 'call could not be completed as dialed'. WCB. Only partial results are back on pt's lab results, will need to wait for all to result.

## 2019-11-17 ENCOUNTER — Other Ambulatory Visit: Payer: Self-pay | Admitting: Thoracic Surgery (Cardiothoracic Vascular Surgery)

## 2019-11-17 DIAGNOSIS — J90 Pleural effusion, not elsewhere classified: Secondary | ICD-10-CM

## 2019-11-17 NOTE — Telephone Encounter (Signed)
Pt has been notified of results. Please labs documentation.   Nothing further needed at this time.

## 2019-11-17 NOTE — Progress Notes (Signed)
Spoke with the pt's spouse ok per DPR and notified of results

## 2019-11-18 ENCOUNTER — Institutional Professional Consult (permissible substitution) (INDEPENDENT_AMBULATORY_CARE_PROVIDER_SITE_OTHER): Payer: Medicare Other | Admitting: Thoracic Surgery (Cardiothoracic Vascular Surgery)

## 2019-11-18 ENCOUNTER — Ambulatory Visit
Admission: RE | Admit: 2019-11-18 | Discharge: 2019-11-18 | Disposition: A | Discharge status: Home / Self Care | Payer: Medicare Other | Source: Ambulatory Visit | Attending: Thoracic Surgery (Cardiothoracic Vascular Surgery) | Admitting: Thoracic Surgery (Cardiothoracic Vascular Surgery)

## 2019-11-18 ENCOUNTER — Other Ambulatory Visit: Payer: Self-pay

## 2019-11-18 ENCOUNTER — Encounter: Payer: Self-pay | Admitting: Thoracic Surgery (Cardiothoracic Vascular Surgery)

## 2019-11-18 VITALS — BP 136/73 | HR 97 | Temp 97.9°F | Resp 18 | Ht 74.0 in | Wt 216.0 lb

## 2019-11-18 DIAGNOSIS — I2510 Atherosclerotic heart disease of native coronary artery without angina pectoris: Secondary | ICD-10-CM

## 2019-11-18 DIAGNOSIS — J90 Pleural effusion, not elsewhere classified: Secondary | ICD-10-CM

## 2019-11-18 DIAGNOSIS — I2584 Coronary atherosclerosis due to calcified coronary lesion: Secondary | ICD-10-CM

## 2019-11-18 DIAGNOSIS — Z9889 Other specified postprocedural states: Secondary | ICD-10-CM | POA: Diagnosis not present

## 2019-11-18 NOTE — Progress Notes (Signed)
PCP is Emeterio Reeve, DO Referring Provider is Tanda Rockers, MD  Chief Complaint  Patient presents with  . Pleural Effusion    LEFT...CT CHEST 11/10/19, LEFT THORACENTESIS 11/10/19.Marland KitchenMarland KitchenCXR today    HPI: [name] is sent for consultation regarding a loculated left pleural effusion  Timothy [name] is a 78 year old man with a history of tobacco abuse, coronary disease, hypertension, hyperlipidemia, atrial fibrillation and arthritis.  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  He was in his usual state of health until about a month ago when he developed left-sided pleuritic chest pain and left shoulder pain.  He also noted chills.  According to his wife he had a single fever of 100.8 which resolved with Tylenol.  She checked it multiple other times when he was having chills and he did not have a fever.  He denies any night sweats.  He was treated with antibiotics and steroids.    He had a chest x-ray which showed a pleural effusion.  He was sent to the emergency room to have the fluid drained.  Thoracentesis drained 400 mL of fluid.  Findings were consistent with exudate.  Cytology showed inflammatory cells, no malignancy.  He was treated for presumed parapneumonic effusion.  He was referred to Dr. Melvyn Novas.  A repeat thoracentesis was attempted by IR but only obtained 5 cc of serosanguineous fluid.  The fluid was noted to be highly loculated on the ultrasound.  His pain has resolved but he continued to have shortness of breath with exertion.  He has a persistent dry cough.  He also complains of a loss of appetite and decreased energy.  He has lost 14 pounds in the past 3 months.  He smoked a pack of cigarettes daily until 2013 when he quit following a heart attack.  He is not having any chest pressure, pain, or tightness to suggest angina.  Zubrod Score: At the time of surgery this patient's most appropriate activity status/level should  be described as: []     0    Normal activity, no symptoms []     1    Restricted in physical strenuous activity but ambulatory, able to do out light work [x]     2    Ambulatory and capable of self care, unable to do work activities, up and about >50 % of waking hours                              []     3    Only limited self care, in bed greater than 50% of waking hours []     4    Completely disabled, no self care, confined to bed or chair []     5    Moribund   Past Medical History:  Diagnosis Date  . Arthritis 09/08/2015  . CAD (coronary artery disease) 09/07/2015   S/P RCA stent x2, Hx MI, (+)ACEI, Statin, BB, ASA - 10/2012   . Essential hypertension 09/07/2015  . History of kidney stones 09/20/2015   11/2013 38mm stone  . Hyperlipidemia 09/07/2015  . Osteopenia 10/29/2015   FRAX Score: 10 year risk of major osteoporotic fracture 8.9%, hip fracture 3.2%, prescription treatment is indicated, called in VitD/Ca, +/- bisphosphanate   . Screening for AAA (aortic abdominal aneurysm) 09/20/2015   Neg 06/11/15 per record review  . Seasonal allergies 09/07/2015  Past Surgical History:  Procedure Laterality Date  . CARDIAC SURGERY Bilateral    2 stints 1- left side  & 1 right side    No family history on file.  Social History Social History   Tobacco Use  . Smoking status: Former Smoker    Packs/day: 1.00    Years: 50.00    Pack years: 50.00    Types: Cigarettes    Start date: 11/16/2012  . Smokeless tobacco: Never Used  . Tobacco comment: Encouraged to remain smoke free  Substance Use Topics  . Alcohol use: No    Alcohol/week: 0.0 standard drinks  . Drug use: No    Current Outpatient Medications  Medication Sig Dispense Refill  . albuterol (VENTOLIN HFA) 108 (90 Base) MCG/ACT inhaler Inhale 2 puffs into the lungs every 6 (six) hours as needed for wheezing or shortness of breath. 18 g 0  . aspirin 81 MG tablet Take 1 tablet (81 mg total) by mouth daily. 90 tablet 3  . Calcium  Carbonate-Vitamin D 600-400 MG-UNIT chew tablet Chew 2 tablets by mouth daily. 180 tablet 3  . metoprolol succinate (TOPROL XL) 25 MG 24 hr tablet Take 0.5 tablets (12.5 mg total) by mouth daily. 45 tablet 3  . montelukast (SINGULAIR) 10 MG tablet Take 1 tablet (10 mg total) by mouth daily. 90 tablet 3  . Multiple Vitamins-Minerals (AIRBORNE PO) Take by mouth daily.    Marland Kitchen telmisartan (MICARDIS) 40 MG tablet Take 1 tablet (40 mg total) by mouth daily. 30 tablet 11  . TURMERIC PO Take by mouth.     No current facility-administered medications for this visit.     No Known Allergies  Review of Systems  Constitutional: Positive for activity change, fatigue and unexpected weight change.  HENT: Negative for trouble swallowing and voice change.   Respiratory: Positive for cough and shortness of breath. Negative for wheezing.        Pleuritic left chest and shoulder pain resolved  Cardiovascular: Positive for leg swelling (Right foot, chronic). Negative for chest pain.  Gastrointestinal: Negative for abdominal distention.  All other systems reviewed and are negative.   BP 136/73 (BP Location: Right Arm, Patient Position: Sitting, Cuff Size: Normal)   Pulse 97   Temp 97.9 F (36.6 C)   Resp 18   Ht 6\' 2"  (1.88 m)   Wt 216 lb (98 kg)   SpO2 91% Comment: RA  BMI 27.73 kg/m  Physical Exam Constitutional:      General: He is not in acute distress.    Appearance: Normal appearance.  HENT:     Head: Normocephalic and atraumatic.  Eyes:     General: No scleral icterus.    Extraocular Movements: Extraocular movements intact.  Neck:     Musculoskeletal: Neck supple.  Cardiovascular:     Rate and Rhythm: Normal rate and regular rhythm.     Heart sounds: No murmur.  Pulmonary:     Effort: Pulmonary effort is normal. No respiratory distress.     Breath sounds: No wheezing or rales.     Comments: Absent breath sounds left base Abdominal:     General: There is no distension.      Palpations: Abdomen is soft.     Tenderness: There is no abdominal tenderness.  Musculoskeletal:     Right lower leg: Edema (Foot and ankle) present.  Skin:    General: Skin is warm and dry.  Neurological:     General: No focal deficit present.  Mental Status: He is alert and oriented to person, place, and time.     Cranial Nerves: No cranial nerve deficit.     Motor: No weakness.    Diagnostic Tests: CT CHEST WITH CONTRAST  TECHNIQUE: Multidetector CT imaging of the chest was performed during intravenous contrast administration.  CONTRAST:  32mL OMNIPAQUE IOHEXOL 300 MG/ML  SOLN  COMPARISON:  Chest radiograph dated 11/04/2019. CT chest dated 01/30/2017.  FINDINGS: Cardiovascular: The heart is normal in size. No pericardial effusion.  No evidence of thoracic aortic aneurysm. Atherosclerotic calcifications of the aortic arch.  Three vessel coronary atherosclerosis.  Mediastinum/Nodes: No suspicious mediastinal lymphadenopathy.  8 mm left thyroid nodule (series 2/image 13).  Lungs/Pleura: Mild centrilobular and paraseptal emphysematous changes, upper lung predominant.  Moderate left pleural effusion. Tiny foci of gas (series 2/image 104), likely related to recent thoracentesis. No pneumothorax.  Lingular and left lower lobe atelectasis. Associated volume loss in the left hemithorax.  No suspicious pulmonary nodules.  No pleural effusion or pneumothorax.  Upper Abdomen: Visualized upper abdomen is notable for nodular thickening of the left adrenal gland and nonobstructing left renal calculi measuring up to 8 mm in the lower pole (series 2/image 131).  Musculoskeletal: Degenerative changes of the visualized thoracolumbar spine.  IMPRESSION: Moderate left pleural effusion. Tiny foci of gas, likely related to recent thoracentesis. No pneumothorax.  Lingular and left lower lobe atelectasis. Associated volume loss in the left  hemithorax.  Aortic Atherosclerosis (ICD10-I70.0) and Emphysema (ICD10-J43.9).   Electronically Signed   By: Julian Hy M.D.   On: 11/10/2019 16:52 CHEST - 2 VIEW  COMPARISON:  11/04/2019.  CT, 11/10/2019.  FINDINGS: Loculated pleural fluid is noted along the upper oblique fissure and laterally. Pleural fluid at the lung base obscures the hemidiaphragm. There is additional parenchymal opacity at the left lung base that is consistent with atelectasis or infection. Remainder of the left lung is clear. Clear right lung. No right pleural effusion. No pneumothorax.  Cardiac silhouette is normal in size. No mediastinal or hilar masses.  Skeletal structures are intact.  IMPRESSION: 1. Small left pleural effusion which is partly loculated laterally and along the superior left oblique fissure. There is associated parenchymal opacity at the left lung base consistent with atelectasis, pneumonia or a combination. The appearance is similar to the prior chest CT. Pleural fluid appears decreased from the prior chest radiograph. 2. No other significant findings. No evidence of pulmonary edema. Right lung remains clear. No right pleural effusion.   Electronically Signed   By: Lajean Manes M.D.   On: 11/18/2019 15:48 I personally reviewed the CT and chest x-ray images.  Concur with the findings noted above.  Chest x-ray from 12/1 shows decrease in pleural fluid compared to previous film of 11/10 and 11/17.  Impression: Timothy [name] is a 78 year old man with a past medical history significant for tobacco abuse, coronary disease, atrial fibrillation, hypertension, hyperlipidemia, and arthritis.  He was in his usual state of health until about a month ago when he developed left-sided chest pain and left shoulder pain.  Work-up revealed a left pleural effusion.  Thoracentesis revealed the fluid to be an exudate.  Cytology was negative for malignancy.  A second thoracentesis was  attempted but was unsuccessful.  I had a long discussion with Mr. Mrs. Glaeser.  We reviewed the CT and chest x-ray images.  We discussed the differential diagnosis.  That includes infectious, inflammatory, and malignant etiologies.  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  Of note although still present his loculated effusion on his chest x-ray today is smaller than it was 2 weeks ago.  That would be highly unusual for malignant effusion.  The most likely scenario is that he had pneumonia and this is a parapneumonic effusion.  I discussed 3 options with Mr. and Mrs. Kornfeld.  The first option would be conservative with continued observation and medical therapy.  Another round of steroids would be an option.  An intermediate option would be to admit him and place a pigtail catheter and then attempt thrombolytic drainage of the remainder of the effusion.  I think there is a reasonable chance that could work there is no guarantee.  There also is a possibility we will get a definitive diagnosis.  Finally, the most aggressive option would be to do a left VATS for drainage of the effusion and decortication.  While that should allow [place] to definitively rule in or out malignancy it is a major operation with significant risk of morbidity and mortality.  I discussed the general nature of the surgery with Mr. and Mrs. Maslowski.  They understand this would be done in the operating room under general anesthesia, the incisions to be used, the use of drainage tubes postoperatively, the expected hospital stay, and the overall recovery time.  I informed them of the indications, risks, benefits, and alternatives.  They understand the risks include, but are not limited to death, MI, DVT, PE, bleeding, possible need for transfusion, infection, prolonged air leak, atrial fibrillation or other cardiac arrhythmias, as well as the possibility of other unforeseeable  complications.  [name] is undecided how he would like to proceed.  Mrs. Delpino would like to be very aggressive and proceed with surgery.  They are going to talk over their options and let us know how they would like to proceed.  Plan: Patient will call and let us know how he would like to proceed  [name], MD [facility] [phone]

## 2019-11-18 NOTE — H&P (View-Only) (Signed)
PCP is Emeterio Reeve, DO Referring Provider is Tanda Rockers, MD  Chief Complaint  Patient presents with  . Pleural Effusion    LEFT...CT CHEST 11/10/19, LEFT THORACENTESIS 11/10/19.Marland KitchenMarland KitchenCXR today    HPI: Timothy [name] is sent for consultation regarding a loculated left pleural effusion  Timothy [name] is a 78 year old man with a history of tobacco abuse, coronary disease, hypertension, hyperlipidemia, atrial fibrillation and arthritis.  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  He was in his usual state of health until about a month ago when he developed left-sided pleuritic chest pain and left shoulder pain.  He also noted chills.  According to his wife he had a single fever of 100.8 which resolved with Tylenol.  She checked it multiple other times when he was having chills and he did not have a fever.  He denies any night sweats.  He was treated with antibiotics and steroids.    He had a chest x-ray which showed a pleural effusion.  He was sent to the emergency room to have the fluid drained.  Thoracentesis drained 400 mL of fluid.  Findings were consistent with exudate.  Cytology showed inflammatory cells, no malignancy.  He was treated for presumed parapneumonic effusion.  He was referred to Dr. Melvyn Novas.  A repeat thoracentesis was attempted by IR but only obtained 5 cc of serosanguineous fluid.  The fluid was noted to be highly loculated on the ultrasound.  His pain has resolved but he continued to have shortness of breath with exertion.  He has a persistent dry cough.  He also complains of a loss of appetite and decreased energy.  He has lost 14 pounds in the past 3 months.  He smoked a pack of cigarettes daily until 2013 when he quit following a heart attack.  He is not having any chest pressure, pain, or tightness to suggest angina.  Zubrod Score: At the time of surgery this patient's most appropriate activity status/level should  be described as: []     0    Normal activity, no symptoms []     1    Restricted in physical strenuous activity but ambulatory, able to do out light work [x]     2    Ambulatory and capable of self care, unable to do work activities, up and about >50 % of waking hours                              []     3    Only limited self care, in bed greater than 50% of waking hours []     4    Completely disabled, no self care, confined to bed or chair []     5    Moribund   Past Medical History:  Diagnosis Date  . Arthritis 09/08/2015  . CAD (coronary artery disease) 09/07/2015   S/P RCA stent x2, Hx MI, (+)ACEI, Statin, BB, ASA - 10/2012   . Essential hypertension 09/07/2015  . History of kidney stones 09/20/2015   11/2013 33mm stone  . Hyperlipidemia 09/07/2015  . Osteopenia 10/29/2015   FRAX Score: 10 year risk of major osteoporotic fracture 8.9%, hip fracture 3.2%, prescription treatment is indicated, called in VitD/Ca, +/- bisphosphanate   . Screening for AAA (aortic abdominal aneurysm) 09/20/2015   Neg 06/11/15 per record review  . Seasonal allergies 09/07/2015  Past Surgical History:  Procedure Laterality Date  . CARDIAC SURGERY Bilateral    2 stints 1- left side  & 1 right side    No family history on file.  Social History Social History   Tobacco Use  . Smoking status: Former Smoker    Packs/day: 1.00    Years: 50.00    Pack years: 50.00    Types: Cigarettes    Start date: 11/16/2012  . Smokeless tobacco: Never Used  . Tobacco comment: Encouraged to remain smoke free  Substance Use Topics  . Alcohol use: No    Alcohol/week: 0.0 standard drinks  . Drug use: No    Current Outpatient Medications  Medication Sig Dispense Refill  . albuterol (VENTOLIN HFA) 108 (90 Base) MCG/ACT inhaler Inhale 2 puffs into the lungs every 6 (six) hours as needed for wheezing or shortness of breath. 18 g 0  . aspirin 81 MG tablet Take 1 tablet (81 mg total) by mouth daily. 90 tablet 3  . Calcium  Carbonate-Vitamin D 600-400 MG-UNIT chew tablet Chew 2 tablets by mouth daily. 180 tablet 3  . metoprolol succinate (TOPROL XL) 25 MG 24 hr tablet Take 0.5 tablets (12.5 mg total) by mouth daily. 45 tablet 3  . montelukast (SINGULAIR) 10 MG tablet Take 1 tablet (10 mg total) by mouth daily. 90 tablet 3  . Multiple Vitamins-Minerals (AIRBORNE PO) Take by mouth daily.    Marland Kitchen telmisartan (MICARDIS) 40 MG tablet Take 1 tablet (40 mg total) by mouth daily. 30 tablet 11  . TURMERIC PO Take by mouth.     No current facility-administered medications for this visit.     No Known Allergies  Review of Systems  Constitutional: Positive for activity change, fatigue and unexpected weight change.  HENT: Negative for trouble swallowing and voice change.   Respiratory: Positive for cough and shortness of breath. Negative for wheezing.        Pleuritic left chest and shoulder pain resolved  Cardiovascular: Positive for leg swelling (Right foot, chronic). Negative for chest pain.  Gastrointestinal: Negative for abdominal distention.  All other systems reviewed and are negative.   BP 136/73 (BP Location: Right Arm, Patient Position: Sitting, Cuff Size: Normal)   Pulse 97   Temp 97.9 F (36.6 C)   Resp 18   Ht 6\' 2"  (1.88 m)   Wt 216 lb (98 kg)   SpO2 91% Comment: RA  BMI 27.73 kg/m  Physical Exam Constitutional:      General: He is not in acute distress.    Appearance: Normal appearance.  HENT:     Head: Normocephalic and atraumatic.  Eyes:     General: No scleral icterus.    Extraocular Movements: Extraocular movements intact.  Neck:     Musculoskeletal: Neck supple.  Cardiovascular:     Rate and Rhythm: Normal rate and regular rhythm.     Heart sounds: No murmur.  Pulmonary:     Effort: Pulmonary effort is normal. No respiratory distress.     Breath sounds: No wheezing or rales.     Comments: Absent breath sounds left base Abdominal:     General: There is no distension.      Palpations: Abdomen is soft.     Tenderness: There is no abdominal tenderness.  Musculoskeletal:     Right lower leg: Edema (Foot and ankle) present.  Skin:    General: Skin is warm and dry.  Neurological:     General: No focal deficit present.  Mental Status: He is alert and oriented to person, place, and time.     Cranial Nerves: No cranial nerve deficit.     Motor: No weakness.    Diagnostic Tests: CT CHEST WITH CONTRAST  TECHNIQUE: Multidetector CT imaging of the chest was performed during intravenous contrast administration.  CONTRAST:  25mL OMNIPAQUE IOHEXOL 300 MG/ML  SOLN  COMPARISON:  Chest radiograph dated 11/04/2019. CT chest dated 01/30/2017.  FINDINGS: Cardiovascular: The heart is normal in size. No pericardial effusion.  No evidence of thoracic aortic aneurysm. Atherosclerotic calcifications of the aortic arch.  Three vessel coronary atherosclerosis.  Mediastinum/Nodes: No suspicious mediastinal lymphadenopathy.  8 mm left thyroid nodule (series 2/image 13).  Lungs/Pleura: Mild centrilobular and paraseptal emphysematous changes, upper lung predominant.  Moderate left pleural effusion. Tiny foci of gas (series 2/image 104), likely related to recent thoracentesis. No pneumothorax.  Lingular and left lower lobe atelectasis. Associated volume loss in the left hemithorax.  No suspicious pulmonary nodules.  No pleural effusion or pneumothorax.  Upper Abdomen: Visualized upper abdomen is notable for nodular thickening of the left adrenal gland and nonobstructing left renal calculi measuring up to 8 mm in the lower pole (series 2/image 131).  Musculoskeletal: Degenerative changes of the visualized thoracolumbar spine.  IMPRESSION: Moderate left pleural effusion. Tiny foci of gas, likely related to recent thoracentesis. No pneumothorax.  Lingular and left lower lobe atelectasis. Associated volume loss in the left  hemithorax.  Aortic Atherosclerosis (ICD10-I70.0) and Emphysema (ICD10-J43.9).   Electronically Signed   By: Julian Hy M.D.   On: 11/10/2019 16:52 CHEST - 2 VIEW  COMPARISON:  11/04/2019.  CT, 11/10/2019.  FINDINGS: Loculated pleural fluid is noted along the upper oblique fissure and laterally. Pleural fluid at the lung base obscures the hemidiaphragm. There is additional parenchymal opacity at the left lung base that is consistent with atelectasis or infection. Remainder of the left lung is clear. Clear right lung. No right pleural effusion. No pneumothorax.  Cardiac silhouette is normal in size. No mediastinal or hilar masses.  Skeletal structures are intact.  IMPRESSION: 1. Small left pleural effusion which is partly loculated laterally and along the superior left oblique fissure. There is associated parenchymal opacity at the left lung base consistent with atelectasis, pneumonia or a combination. The appearance is similar to the prior chest CT. Pleural fluid appears decreased from the prior chest radiograph. 2. No other significant findings. No evidence of pulmonary edema. Right lung remains clear. No right pleural effusion.   Electronically Signed   By: Lajean Manes M.D.   On: 11/18/2019 15:48 I personally reviewed the CT and chest x-ray images.  Concur with the findings noted above.  Chest x-ray from 12/1 shows decrease in pleural fluid compared to previous film of 11/10 and 11/17.  Impression: Timothy [name] is a 78 year old man with a past medical history significant for tobacco abuse, coronary disease, atrial fibrillation, hypertension, hyperlipidemia, and arthritis.  He was in his usual state of health until about a month ago when he developed left-sided chest pain and left shoulder pain.  Work-up revealed a left pleural effusion.  Thoracentesis revealed the fluid to be an exudate.  Cytology was negative for malignancy.  A second thoracentesis was  attempted but was unsuccessful.  I had a long discussion with Timothy [name].  We reviewed the CT and chest x-ray images.  We discussed the differential diagnosis.  That includes infectious, inflammatory, and malignant etiologies.  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  Of note although still present his loculated effusion on his chest x-ray today is smaller than it was 2 weeks ago.  That would be highly unusual for malignant effusion.  The most likely scenario is that he had pneumonia and this is a parapneumonic effusion.  I discussed 3 options with Timothy [name].  The first option would be conservative with continued observation and medical therapy.  Another round of steroids would be an option.  An intermediate option would be to admit him and place a pigtail catheter and then attempt thrombolytic drainage of the remainder of the effusion.  I think there is a reasonable chance that could work there is no guarantee.  There also is a possibility we will get a definitive diagnosis.  Finally, the most aggressive option would be to do a left VATS for drainage of the effusion and decortication.  While that should allow [place] to definitively rule in or out malignancy it is a major operation with significant risk of morbidity and mortality.  I discussed the general nature of the surgery with Timothy [name].  They understand this would be done in the operating room under general anesthesia, the incisions to be used, the use of drainage tubes postoperatively, the expected hospital stay, and the overall recovery time.  I informed them of the indications, risks, benefits, and alternatives.  They understand the risks include, but are not limited to death, MI, DVT, PE, bleeding, possible need for transfusion, infection, prolonged air leak, atrial fibrillation or other cardiac arrhythmias, as well as the possibility of other unforeseeable  complications.  Timothy [name] is undecided how he would like to proceed.  Timothy [name] would like to be very aggressive and proceed with surgery.  They are going to talk over their options and let us know how they would like to proceed.  Plan: Patient will call and let us know how he would like to proceed  [name], MD [facility] [phone]

## 2019-11-19 ENCOUNTER — Other Ambulatory Visit: Payer: Self-pay | Admitting: *Deleted

## 2019-11-19 ENCOUNTER — Encounter: Payer: Self-pay | Admitting: *Deleted

## 2019-11-19 DIAGNOSIS — J90 Pleural effusion, not elsewhere classified: Secondary | ICD-10-CM

## 2019-11-21 NOTE — Progress Notes (Signed)
[place], [place] [address] Phone: [phone] Fax: [phone]  [facility] [place], [place] [address] Phone: [phone] Fax: [phone]  [facility] [place], [place] [address] [place] [address] Phone: [phone] Fax: [phone]      Your procedure is scheduled on Wednesday 11/26/2019.  Report to [facility] Main Entrance "A" at 0930 A.M., and check in at the Admitting office.  Call this number if you have problems the morning of surgery:  [phone]  Call [phone] if you have any questions prior to your surgery date Monday-Friday 8am-4pm    Remember:  Do not eat or drink after midnight the night before your surgery   Take these medicines the morning of surgery with A SIP OF WATER: Albuterol Inhaler (Ventolin) - if needed Eye drops - if needed Metoprolol succinate (Toprol-XL) Montelukast (Singulair)   7 days prior to surgery STOP taking any Tumeric, Aspirin (unless otherwise instructed by your surgeon), Aleve, Naproxen, Ibuprofen, Motrin, Advil, Goody's, BC's, all herbal medications, fish oil, and all vitamins.  Follow your surgeon's instructions on when to stop Aspirin.  If no instructions were given by your surgeon then you will need to call the office to get those instructions.      The Morning of Surgery  Do not wear jewelry.  Do not wear lotions, powders, colognes, or deodorant  Men may shave face and neck.  Do not bring valuables to the hospital.  [facility] is not responsible for any belongings or valuables.  If you are a smoker, DO NOT Smoke 24 hours prior to surgery  If you wear a CPAP at night please bring your mask, tubing, and machine the morning of surgery   Remember that you must have someone to transport you home after  your surgery, and remain with you for 24 hours if you are discharged the same day.   Please bring cases for contacts, glasses, hearing aids, dentures or bridgework because it cannot be worn into surgery.    Leave your suitcase in the car.  After surgery it may be brought to your room.  For patients admitted to the hospital, discharge time will be determined by your treatment team.  Patients discharged the day of surgery will not be allowed to drive home.    Special instructions:   [place]- Preparing For Surgery  Before surgery, you can play an important role. Because skin is not sterile, your skin needs to be as free of germs as possible. You can reduce the number of germs on your skin by washing with CHG (chlorahexidine gluconate) Soap before surgery.  CHG is an antiseptic cleaner which kills germs and bonds with the skin to continue killing germs even after washing.    Oral Hygiene is also important to reduce your risk of infection.  Remember - BRUSH YOUR TEETH THE MORNING OF SURGERY WITH YOUR REGULAR TOOTHPASTE  Please do not use if you have an allergy to CHG or antibacterial soaps. If your skin becomes reddened/irritated stop using the CHG.  Do not shave (including legs and underarms) for at least 48 hours prior to first CHG shower. It is OK to shave your face.  Please follow these instructions carefully.   1. Shower the NIGHT BEFORE SURGERY and the MORNING OF SURGERY  with CHG Soap.   2. If you chose to wash your hair, wash your hair first as usual with your normal shampoo.  3. After you shampoo, rinse your hair and body thoroughly to remove the shampoo.  4. Use CHG as you would any other liquid soap. You can apply CHG directly to the skin and wash gently with a scrungie or a clean washcloth.   5. Apply the CHG Soap to your body ONLY FROM THE NECK DOWN.  Do not use on open wounds or open sores. Avoid contact with your eyes, ears, mouth and genitals (private parts). Wash Face  and genitals (private parts)  with your normal soap.   6. Wash thoroughly, paying special attention to the area where your surgery will be performed.  7. Thoroughly rinse your body with warm water from the neck down.  8. DO NOT shower/wash with your normal soap after using and rinsing off the CHG Soap.  9. Pat yourself dry with a CLEAN TOWEL.  10. Wear CLEAN PAJAMAS to bed the night before surgery, wear comfortable clothes the morning of surgery  11. Place CLEAN SHEETS on your bed the night of your first shower and DO NOT SLEEP WITH PETS.    Day of Surgery:  Please shower the morning of surgery with the CHG soap Do not apply any deodorants/lotions. Please wear clean clothes to the hospital/surgery center.   Remember to brush your teeth WITH YOUR REGULAR TOOTHPASTE.   Please read over the following fact sheets that you were given.

## 2019-11-24 ENCOUNTER — Other Ambulatory Visit (HOSPITAL_COMMUNITY)
Admission: RE | Admit: 2019-11-24 | Discharge: 2019-11-24 | Disposition: A | Discharge status: Home / Self Care | Payer: Medicare Other | Source: Ambulatory Visit

## 2019-11-24 ENCOUNTER — Other Ambulatory Visit: Payer: Self-pay

## 2019-11-24 ENCOUNTER — Encounter (HOSPITAL_COMMUNITY)
Admission: RE | Admit: 2019-11-24 | Discharge: 2019-11-24 | Disposition: A | Discharge status: Home / Self Care | Payer: Medicare Other | Source: Ambulatory Visit | Attending: Thoracic Surgery (Cardiothoracic Vascular Surgery) | Admitting: Thoracic Surgery (Cardiothoracic Vascular Surgery)

## 2019-11-24 ENCOUNTER — Encounter (HOSPITAL_COMMUNITY): Payer: Self-pay

## 2019-11-24 ENCOUNTER — Ambulatory Visit (HOSPITAL_COMMUNITY)
Admission: RE | Admit: 2019-11-24 | Discharge: 2019-11-24 | Disposition: A | Discharge status: Home / Self Care | Payer: Medicare Other | Source: Ambulatory Visit | Attending: Thoracic Surgery (Cardiothoracic Vascular Surgery) | Admitting: Thoracic Surgery (Cardiothoracic Vascular Surgery)

## 2019-11-24 DIAGNOSIS — Z20828 Contact with and (suspected) exposure to other viral communicable diseases: Secondary | ICD-10-CM | POA: Insufficient documentation

## 2019-11-24 DIAGNOSIS — J90 Pleural effusion, not elsewhere classified: Secondary | ICD-10-CM

## 2019-11-24 HISTORY — DX: Acute myocardial infarction, unspecified: I21.9

## 2019-11-24 HISTORY — DX: Paroxysmal atrial fibrillation: I48.0

## 2019-11-24 LAB — CBC
HCT: 39.5 % (ref 39.0–52.0)
Hemoglobin: 12.3 g/dL — ABNORMAL LOW (ref 13.0–17.0)
MCH: 29.6 pg (ref 26.0–34.0)
MCHC: 31.1 g/dL (ref 30.0–36.0)
MCV: 95 fL (ref 80.0–100.0)
Platelets: 360 10*3/uL (ref 150–400)
RBC: 4.16 MIL/uL — ABNORMAL LOW (ref 4.22–5.81)
RDW: 13.4 % (ref 11.5–15.5)
WBC: 9.1 10*3/uL (ref 4.0–10.5)
nRBC: 0 % (ref 0.0–0.2)

## 2019-11-24 LAB — URINALYSIS, ROUTINE W REFLEX MICROSCOPIC
Bilirubin Urine: NEGATIVE
Glucose, UA: NEGATIVE mg/dL
Ketones, ur: NEGATIVE mg/dL
Nitrite: NEGATIVE
Protein, ur: NEGATIVE mg/dL
Specific Gravity, Urine: 1.025 (ref 1.005–1.030)
pH: 6 (ref 5.0–8.0)

## 2019-11-24 LAB — TYPE AND SCREEN
ABO/RH(D): O POS
Antibody Screen: NEGATIVE

## 2019-11-24 LAB — SURGICAL PCR SCREEN
MRSA, PCR: NEGATIVE
Staphylococcus aureus: POSITIVE — AB

## 2019-11-24 LAB — URINALYSIS, MICROSCOPIC (REFLEX)

## 2019-11-24 LAB — PROTIME-INR
INR: 1.2 (ref 0.8–1.2)
Prothrombin Time: 15.3 seconds — ABNORMAL HIGH (ref 11.4–15.2)

## 2019-11-24 LAB — COMPREHENSIVE METABOLIC PANEL
ALT: 26 U/L (ref 0–44)
AST: 26 U/L (ref 15–41)
Albumin: 2.5 g/dL — ABNORMAL LOW (ref 3.5–5.0)
Alkaline Phosphatase: 93 U/L (ref 38–126)
Anion gap: 12 (ref 5–15)
BUN: 15 mg/dL (ref 8–23)
CO2: 25 mmol/L (ref 22–32)
Calcium: 9.1 mg/dL (ref 8.9–10.3)
Chloride: 102 mmol/L (ref 98–111)
Creatinine, Ser: 0.97 mg/dL (ref 0.61–1.24)
GFR calc Af Amer: >60 mL/min (ref >60)
GFR calc non Af Amer: >60 mL/min (ref >60)
Glucose, Bld: 106 mg/dL — ABNORMAL HIGH (ref 70–99)
Potassium: 3.9 mmol/L (ref 3.5–5.1)
Sodium: 139 mmol/L (ref 135–145)
Total Bilirubin: 0.5 mg/dL (ref 0.3–1.2)
Total Protein: 6.8 g/dL (ref 6.5–8.1)

## 2019-11-24 LAB — ABO/RH: ABO/RH(D): O POS

## 2019-11-24 LAB — APTT: aPTT: 34 seconds (ref 24–36)

## 2019-11-24 LAB — SARS CORONAVIRUS 2 (TAT 6-24 HRS): SARS Coronavirus 2: NEGATIVE

## 2019-11-24 NOTE — Progress Notes (Addendum)
PCP - Emeterio Reeve, DO Cardiologist -  Pt denies  Chest x-ray - 11/24/2019 EKG - 10/28/2019 in EPIC  PPM/ICD- n/a DEVICE- n/a REP Notified- n/a  Stress Test - pt denies ECHO - pt denies  Cardiac Cath -  Stents placed in 2016 in [place], Dr. Roxan Hockey aware, patient unsure where he had procedure, it is notated in history  Sleep Study - pt denies CPAP - n/a  Fasting Blood Sugar - n/a Checks Blood Sugar _____ times a day-/na  Blood Thinner Instructions: n/a Aspirin Instructions: Aspirin 81 mg -hold  ERAS Protocol- n/a Ensure pre-surgery drink or water given- n/a  COVID testing- 09/24/2019  Anesthesia review: Yes-heart history  Patient denies shortness of breath, fever, cough and chest pain at PAT appointment  Patient verbalized understanding of instructions that were given to them at the PAT appointment. Patient was also instructed that they will need to review over the PAT instructions again at home before surgery.

## 2019-11-24 NOTE — Progress Notes (Addendum)
Anesthesia Chart Review:  Case: [id number] Date/Time: 11/26/19 1120   Procedures:      VIDEO BRONCHOSCOPY (N/A )     VIDEO ASSISTED THORACOSCOPY (Left Chest)     POSSIBLE THORACOTOMY (Left )     DRAINAGE OF PLEURAL EFFUSION (Left )     POSSIBLE DECORTICATION (Left )   Anesthesia type: General   Pre-op diagnosis: LOCULATED LEFT PLEURAL EFFUSION   Location: [facility] OR ROOM 10 / [place] [facility]   Surgeon: Timothy Nakayama, MD      DISCUSSION: Patient is a 78 year old male scheduled for the above procedure. He was seen by Timothy [name] on 11/18/19 for loculated left pleural effusion. Hospitalized at [facility] in November (10/29/19-10/31/19) with worsening dyspnea and cough. Treated for left pneumonia with large pleural effusion, s/p left thoracentesis 10/30/19 (culture: Parvimonas micro; cytology: mixed inflammatory changes, occasional reactive mesothelial cells, negative malignancy). Discharged on Augmentin and pulmonology follow-up. He had moderate to large left pleural effusion on 11/04/19 CXR and underwent unsuccessful (< 5cc, densely loculated) left thoracentesis 11/09/29 (cytology: no malignancy). 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 Clinical picture felt to be very unusual for reactivation TB and parapneumonic effusion felt most likely etiology. Malignancy felt less likely given negative cytology and decrease in effusion size. Management options discussed by surgeon with patient ultimately opted for above surgery for drainage and decortication of loculated effusion with biopsies for definitive diagnosis.    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 He is established with PCP Timothy [name], Timothy [name], but has not established with cardiology. Last known cardiology visit was on 04/01/15 with Timothy Pastor, MD with [facility] in [facility] ([place]). Those notes outline PCI history and mention history of afib (but otherwise no other details seen regarding afib/PAF). He had normal LVEF as of 11/2011. He had an EKG shortly after establishing care with Dr. Sheppard [name] and was in [place] on 09/15/15. First known recurrence of afib was on 04/16/19 following a self-limiting episode of hypotension with dizziness. He was seen by a covering provider at his primary care office, and EKG showed afib at 100 bpm. Patient reported "obnoxious bleeding and bruising", so continued on ASA and b-blocker therapy. He was also in afib with RVR (rate 110-130's) on 05/23/19 when he presented to [facility] ED for syncope in setting of decreased liquid intake. HR improved with IVF. ED work-up was "negative (head CT: no acute intracranial pathology, left parietal extracalvarial hematoma, left mastoiditis/OM; CXR: mild basilar densities, likely ATX; labs unremarkable). Admission offered, but patient adopted for discharge home. He was in SR with PACs on most recent EKG from 10/28/19.   Timothy [name] classified patient's Zubrod Score as 2 (Ambulatory and capable of self care, unable to Timothy [name] work activities, up and about >50 % of waking hours). 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 Mitogen-NIL > 10.00 IU/mL. 11/10/19 Acid Fast Smear NEGATIVE. AFB Culture in process.  - Per Dr. Melvyn [name] on 11/10/19: "Studies are suggestive of TB  exposure so needs afb smear and culture added to T centesis and will be wearing a mask anyway for time being so hold off on w/u unless sputum production in which case  needs to turn in 3 am samples"  COVID-19 tests negative 11/07/19 and 11/24/19. Discussed above with anesthesiologist Timothy Gaudy, MD. Given nature of surgery/diagnosis then would anticipate that he could proceed with surgery for loculated left pleural effusion if no acute CV issues on the day of surgery. He remains on b-blocker therapy. ASA currently on hold. However, request to reach out to pulmonology to inquire if any isolation precautions warranted for planned procedure given pending AFB culture. I communicated with Dr. Melvyn [name] who wrote, "If he has TB at all it would be in the pleural space, not in the lung, so I would just Timothy [name] the usual covid precautions and they would be more than enough to prevent spread of TB in this setting." Communicated with [place] and on anesthesia communication board.    VS: BP (!) 142/62   Pulse 93   Temp 36.6 C (Oral)   Resp 19   Ht 6\' 2"  (1.88 m)   Wt 100 kg   SpO2 93%   BMI 28.30 kg/m    PROVIDERS: Timothy [name], Timothy [name] his PCP. Established care 09/07/15. Timothy Gully, MD is pulmonologist No current cardiologist. He was seen Timothy Pastor, MD with [facility] in [facility] ([place]). Last visit 04/01/15.   LABS: Labs reviewed: Acceptable for surgery. (all labs ordered are listed, but only abnormal results are displayed)  Labs Reviewed  SURGICAL PCR SCREEN - Abnormal; Notable for the following components:      Result Value   Staphylococcus aureus POSITIVE (*)    All other components within normal limits  CBC - Abnormal; Notable for the following components:   RBC 4.16 (*)    Hemoglobin 12.3 (*)    All other components within normal limits  COMPREHENSIVE METABOLIC PANEL - Abnormal; Notable for the following components:   Glucose, Bld 106 (*)    Albumin 2.5 (*)    All other components within normal limits  PROTIME-INR - Abnormal; Notable for the following components:   Prothrombin Time 15.3  (*)    All other components within normal limits  URINALYSIS, ROUTINE W REFLEX MICROSCOPIC - Abnormal; Notable for the following components:   Hgb urine dipstick TRACE (*)    Leukocytes,Ua TRACE (*)    All other components within normal limits  URINALYSIS, MICROSCOPIC (REFLEX) - Abnormal; Notable for the following components:   Bacteria, UA RARE (*)    All other components within normal limits  APTT  TYPE AND SCREEN  A1c 5.9% on 10/30/19 ([place])   IMAGES: CXR 11/24/19: FINDINGS: Cardiac shadow is stable. Aortic calcifications are again seen. The right lung is clear. Left lung demonstrates small effusion tracking along the lateral aspect of the chest as well as some fluid trapped within the fissure more superiorly similar to that seen on prior CT examination. Underlying scarring is noted. IMPRESSION: Persistent and stable left pleural effusion. Some loculation in the fissure is noted as well. The changes are stable from the prior CT examination.  CT Chest 11/10/19: IMPRESSION: - Moderate left pleural effusion. Tiny foci of gas, likely related to recent thoracentesis. No pneumothorax. - Lingular and left lower lobe atelectasis. Associated volume loss in the left hemithorax. - Aortic Atherosclerosis (ICD10-I70.0) and Emphysema (ICD10-J43.9).   EKG: 10/28/19: SR with PACs. Inferior infarct (  age undetermined). Afib resolved since 6/5//20 EKG (Care Everywhere). PACs are new when compared to 09/15/15.    CV: No AAA noted on 12/04/13 CT abd/pelvis without contrast or 06/11/15 abdominal aorta US ([place]).  Echo 0000000 ([place]) Summary  There is Grade I left ventricular diastolic dysfunction.  Otherwise normal echocardiography study. (Estimated LVEF 61%. Normal left ventricular size and wall thickness. Normal left ventricular systolic function. Trace mitral regurgitation. Trace tricuspid  regurgitation. Trace pulmonic insufficiency. Left atrium is normal in size.)   Past Medical History:  Diagnosis Date  . Arthritis 09/08/2015  . CAD (coronary artery disease) 09/07/2015   S/P RCA stent x2, Hx MI, (+)ACEI, Statin, BB, ASA - 10/2012   . Essential hypertension 09/07/2015  . History of kidney stones 09/20/2015   11/2013 62mm stone  . Hyperlipidemia 09/07/2015  . Myocardial infarction ([place])    11/2011, s/p RCA stent  . Osteopenia 10/29/2015   FRAX Score: 10 year risk of major osteoporotic fracture 8.9%, hip fracture 3.2%, prescription treatment is indicated, called in VitD/Ca, +/- bisphosphanate   . PAF (paroxysmal atrial fibrillation) ([place])   . Screening for AAA (aortic abdominal aneurysm) 09/20/2015   Neg 06/11/15 per record review  . Seasonal allergies 09/07/2015    Past Surgical History:  Procedure Laterality Date  . CARDIAC SURGERY Bilateral 2013   2 stints 1- left side  & 1 right side  . CORONARY ANGIOPLASTY     RCA stent 11/2011; DES LAD 01/2012 [facility] state)    MEDICATIONS: . albuterol (VENTOLIN HFA) 108 (90 Base) MCG/ACT inhaler  . aspirin 81 MG tablet  . Calcium Carbonate-Vitamin D 600-400 MG-UNIT chew tablet  . hydroxypropyl methylcellulose / hypromellose (ISOPTO TEARS / GONIOVISC) 2.5 % ophthalmic solution  . metoprolol succinate (TOPROL XL) 25 MG 24 hr tablet  . montelukast (SINGULAIR) 10 MG tablet  . Multiple Vitamins-Minerals (AIRBORNE PO)  . telmisartan (MICARDIS) 40 MG tablet  . Turmeric 500 MG TABS   No current facility-administered medications for this encounter.     Myra Gianotti, PA-C Surgical Short Stay/Anesthesiology [facility] Phone [phone] [facility] Phone [phone] 11/25/2019 11:50 AM

## 2019-11-25 NOTE — Anesthesia Preprocedure Evaluation (Addendum)
Anesthesia Evaluation  Patient identified by MRN, date of birth, ID band Patient awake    Reviewed: Allergy & Precautions, NPO status , Patient's Chart, lab work & pertinent test results  Airway Mallampati: I  TM Distance: >3 FB Neck ROM: Full    Dental   Pulmonary former smoker,    Pulmonary exam normal        Cardiovascular hypertension, Pt. on medications + CAD, + Past MI and + Cardiac Stents  Normal cardiovascular exam+ dysrhythmias Atrial Fibrillation      Neuro/Psych    GI/Hepatic   Endo/Other    Renal/GU      Musculoskeletal   Abdominal   Peds  Hematology   Anesthesia Other Findings   Reproductive/Obstetrics                            Anesthesia Physical Anesthesia Plan  ASA: III  Anesthesia Plan: General   Post-op Pain Management:    Induction: Intravenous  PONV Risk Score and Plan: 2 and Ondansetron, Midazolam and Treatment may vary due to age or medical condition  Airway Management Planned: Double Lumen EBT  Additional Equipment: Arterial line, CVP and Ultrasound Guidance Line Placement  Intra-op Plan:   Post-operative Plan: Extubation in OR  Informed Consent: I have reviewed the patients History and Physical, chart, labs and discussed the procedure including the risks, benefits and alternatives for the proposed anesthesia with the patient or authorized representative who has indicated his/her understanding and acceptance.       Plan Discussed with: CRNA and Surgeon  Anesthesia Plan Comments: (See PAT note written 11/25/2019 by Myra Gianotti, PA-C. Loculated pleural effusion felt most likely parapneumonic; however, he did had positive QuantiFERON-TB Gold test with negative AFB smear and pending AFB culture. COVID-19 negative test. Communicated with Dr. Melvyn Novas, "If he has TB at all it would be in the pleural space, not in the lung, so I would just do the usual covid  precautions and they would be more than enough to prevent spread of TB in this setting.")       Anesthesia Quick Evaluation

## 2019-11-26 ENCOUNTER — Encounter (HOSPITAL_COMMUNITY): Payer: Self-pay | Admitting: Certified Registered Nurse Anesthetist

## 2019-11-26 ENCOUNTER — Inpatient Hospital Stay (HOSPITAL_COMMUNITY): Payer: Medicare Other

## 2019-11-26 ENCOUNTER — Other Ambulatory Visit: Payer: Self-pay

## 2019-11-26 ENCOUNTER — Encounter (HOSPITAL_COMMUNITY)
Admission: RE | Disposition: A | Discharge status: Home / Self Care | Payer: Self-pay | Source: Home / Self Care | Attending: Thoracic Surgery (Cardiothoracic Vascular Surgery)

## 2019-11-26 ENCOUNTER — Inpatient Hospital Stay (HOSPITAL_COMMUNITY): Payer: Medicare Other | Admitting: Certified Registered"

## 2019-11-26 ENCOUNTER — Inpatient Hospital Stay (HOSPITAL_COMMUNITY)
Admission: RE | Admit: 2019-11-26 | Discharge: 2019-11-30 | DRG: 164 | Disposition: A | Discharge status: Home / Self Care | Payer: Medicare Other | Attending: Thoracic Surgery (Cardiothoracic Vascular Surgery) | Admitting: Thoracic Surgery (Cardiothoracic Vascular Surgery)

## 2019-11-26 ENCOUNTER — Inpatient Hospital Stay (HOSPITAL_COMMUNITY): Payer: Medicare Other | Admitting: Vascular Surgery

## 2019-11-26 DIAGNOSIS — Z87891 Personal history of nicotine dependence: Secondary | ICD-10-CM

## 2019-11-26 DIAGNOSIS — Z79899 Other long term (current) drug therapy: Secondary | ICD-10-CM

## 2019-11-26 DIAGNOSIS — Z87442 Personal history of urinary calculi: Secondary | ICD-10-CM | POA: Diagnosis not present

## 2019-11-26 DIAGNOSIS — M8580 Other specified disorders of bone density and structure, unspecified site: Secondary | ICD-10-CM | POA: Diagnosis present

## 2019-11-26 DIAGNOSIS — Z9889 Other specified postprocedural states: Secondary | ICD-10-CM

## 2019-11-26 DIAGNOSIS — R091 Pleurisy: Secondary | ICD-10-CM | POA: Diagnosis not present

## 2019-11-26 DIAGNOSIS — J918 Pleural effusion in other conditions classified elsewhere: Secondary | ICD-10-CM | POA: Diagnosis present

## 2019-11-26 DIAGNOSIS — I252 Old myocardial infarction: Secondary | ICD-10-CM

## 2019-11-26 DIAGNOSIS — Z7982 Long term (current) use of aspirin: Secondary | ICD-10-CM | POA: Diagnosis not present

## 2019-11-26 DIAGNOSIS — Z955 Presence of coronary angioplasty implant and graft: Secondary | ICD-10-CM | POA: Diagnosis not present

## 2019-11-26 DIAGNOSIS — J90 Pleural effusion, not elsewhere classified: Secondary | ICD-10-CM

## 2019-11-26 DIAGNOSIS — Z20828 Contact with and (suspected) exposure to other viral communicable diseases: Secondary | ICD-10-CM | POA: Diagnosis present

## 2019-11-26 DIAGNOSIS — I959 Hypotension, unspecified: Secondary | ICD-10-CM | POA: Diagnosis not present

## 2019-11-26 DIAGNOSIS — E785 Hyperlipidemia, unspecified: Secondary | ICD-10-CM | POA: Diagnosis present

## 2019-11-26 DIAGNOSIS — M1990 Unspecified osteoarthritis, unspecified site: Secondary | ICD-10-CM | POA: Diagnosis present

## 2019-11-26 DIAGNOSIS — I10 Essential (primary) hypertension: Secondary | ICD-10-CM | POA: Diagnosis present

## 2019-11-26 DIAGNOSIS — I4891 Unspecified atrial fibrillation: Secondary | ICD-10-CM | POA: Diagnosis present

## 2019-11-26 DIAGNOSIS — J189 Pneumonia, unspecified organism: Secondary | ICD-10-CM | POA: Diagnosis present

## 2019-11-26 DIAGNOSIS — I2510 Atherosclerotic heart disease of native coronary artery without angina pectoris: Secondary | ICD-10-CM | POA: Diagnosis present

## 2019-11-26 DIAGNOSIS — Z09 Encounter for follow-up examination after completed treatment for conditions other than malignant neoplasm: Secondary | ICD-10-CM

## 2019-11-26 DIAGNOSIS — J869 Pyothorax without fistula: Secondary | ICD-10-CM

## 2019-11-26 HISTORY — PX: VIDEO BRONCHOSCOPY: SHX5072

## 2019-11-26 HISTORY — PX: VIDEO ASSISTED THORACOSCOPY: SHX5073

## 2019-11-26 HISTORY — PX: PLEURAL EFFUSION DRAINAGE: SHX5099

## 2019-11-26 HISTORY — PX: DECORTICATION: SHX5101

## 2019-11-26 LAB — POCT I-STAT 7, (LYTES, BLD GAS, ICA,H+H)
Acid-Base Excess: 3 mmol/L — ABNORMAL HIGH (ref 0.0–2.0)
Bicarbonate: 26.9 mmol/L (ref 20.0–28.0)
Calcium, Ion: 1.25 mmol/L (ref 1.15–1.40)
HCT: 33 % — ABNORMAL LOW (ref 39.0–52.0)
Hemoglobin: 11.2 g/dL — ABNORMAL LOW (ref 13.0–17.0)
O2 Saturation: 90 %
Patient temperature: 98.1
Potassium: 4.1 mmol/L (ref 3.5–5.1)
Sodium: 138 mmol/L (ref 135–145)
TCO2: 28 mmol/L (ref 22–32)
pCO2 arterial: 38.2 mmHg (ref 32.0–48.0)
pH, Arterial: 7.455 — ABNORMAL HIGH (ref 7.350–7.450)
pO2, Arterial: 55 mmHg — ABNORMAL LOW (ref 83.0–108.0)

## 2019-11-26 SURGERY — BRONCHOSCOPY, VIDEO-ASSISTED
Anesthesia: General | Site: Chest

## 2019-11-26 MED ORDER — DEXAMETHASONE SODIUM PHOSPHATE 10 MG/ML IJ SOLN
INTRAMUSCULAR | Status: AC
  Filled 2019-11-26: qty 1

## 2019-11-26 MED ORDER — GLYCOPYRROLATE PF 0.2 MG/ML IJ SOSY
PREFILLED_SYRINGE | INTRAMUSCULAR | Status: DC | PRN
  Administered 2019-11-26: .2 mg via INTRAVENOUS

## 2019-11-26 MED ORDER — BUPIVACAINE HCL (PF) 0.5 % IJ SOLN
INTRAMUSCULAR | Status: AC
  Filled 2019-11-26: qty 30

## 2019-11-26 MED ORDER — LACTATED RINGERS IV SOLN
INTRAVENOUS | Status: DC
  Administered 2019-11-26: 10:00:00 via INTRAVENOUS

## 2019-11-26 MED ORDER — HYDROMORPHONE 1 MG/ML IV SOLN
INTRAVENOUS | Status: DC
  Administered 2019-11-26: 30 mg via INTRAVENOUS
  Administered 2019-11-27: 1.2 mg via INTRAVENOUS
  Administered 2019-11-27: 0.3 mg via INTRAVENOUS
  Administered 2019-11-27: 1.5 mg via INTRAVENOUS
  Administered 2019-11-28: 0.5 mg via INTRAVENOUS
  Administered 2019-11-28: 0 mg via INTRAVENOUS
  Administered 2019-11-28: 0.3 mg via INTRAVENOUS
  Administered 2019-11-28: 1.5 mg via INTRAVENOUS
  Administered 2019-11-28 – 2019-11-29 (×3): 0.6 mg via INTRAVENOUS

## 2019-11-26 MED ORDER — SUGAMMADEX SODIUM 200 MG/2ML IV SOLN
INTRAVENOUS | Status: DC | PRN
  Administered 2019-11-26: 200 mg via INTRAVENOUS

## 2019-11-26 MED ORDER — DEXAMETHASONE SODIUM PHOSPHATE 10 MG/ML IJ SOLN
INTRAMUSCULAR | Status: DC | PRN
  Administered 2019-11-26: 10 mg via INTRAVENOUS

## 2019-11-26 MED ORDER — PROPOFOL 10 MG/ML IV BOLUS
INTRAVENOUS | Status: AC
  Filled 2019-11-26: qty 20

## 2019-11-26 MED ORDER — MIDAZOLAM HCL 2 MG/2ML IJ SOLN
INTRAMUSCULAR | Status: AC
  Filled 2019-11-26: qty 2

## 2019-11-26 MED ORDER — BUPIVACAINE-EPINEPHRINE (PF) 0.5% -1:200000 IJ SOLN
INTRAMUSCULAR | Status: AC
  Filled 2019-11-26: qty 30

## 2019-11-26 MED ORDER — CEFAZOLIN SODIUM-DEXTROSE 2-4 GM/100ML-% IV SOLN
INTRAVENOUS | Status: AC
  Filled 2019-11-26: qty 100

## 2019-11-26 MED ORDER — FENTANYL CITRATE (PF) 250 MCG/5ML IJ SOLN
INTRAMUSCULAR | Status: DC | PRN
  Administered 2019-11-26: 25 ug via INTRAVENOUS
  Administered 2019-11-26 (×2): 50 ug via INTRAVENOUS
  Administered 2019-11-26: 125 ug via INTRAVENOUS
  Administered 2019-11-26: 50 ug via INTRAVENOUS

## 2019-11-26 MED ORDER — ONDANSETRON HCL 4 MG/2ML IJ SOLN
INTRAMUSCULAR | Status: AC
  Filled 2019-11-26: qty 2

## 2019-11-26 MED ORDER — LACTATED RINGERS IV SOLN
INTRAVENOUS | Status: DC | PRN
  Administered 2019-11-26: 14:00:00 via INTRAVENOUS

## 2019-11-26 MED ORDER — DIPHENHYDRAMINE HCL 12.5 MG/5ML PO ELIX
12.5000 mg | ORAL_SOLUTION | Freq: Four times a day (QID) | ORAL | Status: DC | PRN
  Filled 2019-11-26: qty 5

## 2019-11-26 MED ORDER — FENTANYL CITRATE (PF) 100 MCG/2ML IJ SOLN: 25.0000 ug | INTRAMUSCULAR | Status: DC | PRN

## 2019-11-26 MED ORDER — SODIUM CHLORIDE (PF) 0.9 % IJ SOLN: INTRAMUSCULAR | Status: DC | PRN

## 2019-11-26 MED ORDER — LIDOCAINE 2% (20 MG/ML) 5 ML SYRINGE
INTRAMUSCULAR | Status: DC | PRN
  Administered 2019-11-26: 100 mg via INTRAVENOUS

## 2019-11-26 MED ORDER — SODIUM CHLORIDE 0.9% FLUSH
9.0000 mL | INTRAVENOUS | Status: DC | PRN
  Administered 2019-11-28: 9 mL via INTRAVENOUS

## 2019-11-26 MED ORDER — LIDOCAINE 2% (20 MG/ML) 5 ML SYRINGE
INTRAMUSCULAR | Status: AC
  Filled 2019-11-26: qty 5

## 2019-11-26 MED ORDER — CEFAZOLIN SODIUM-DEXTROSE 2-3 GM-%(50ML) IV SOLR
INTRAVENOUS | Status: DC | PRN
  Administered 2019-11-26: 2 g via INTRAVENOUS

## 2019-11-26 MED ORDER — ONDANSETRON HCL 4 MG/2ML IJ SOLN: 4.0000 mg | Freq: Once | INTRAMUSCULAR | Status: DC | PRN

## 2019-11-26 MED ORDER — FENTANYL CITRATE (PF) 250 MCG/5ML IJ SOLN
INTRAMUSCULAR | Status: AC
  Filled 2019-11-26: qty 5

## 2019-11-26 MED ORDER — PROPOFOL 10 MG/ML IV BOLUS
INTRAVENOUS | Status: DC | PRN
  Administered 2019-11-26: 100 mg via INTRAVENOUS

## 2019-11-26 MED ORDER — 0.9 % SODIUM CHLORIDE (POUR BTL) OPTIME
TOPICAL | Status: DC | PRN
  Administered 2019-11-26: 12:00:00 1000 mL

## 2019-11-26 MED ORDER — BUPIVACAINE LIPOSOME 1.3 % IJ SUSP
20.0000 mL | Freq: Once | INTRAMUSCULAR | Status: DC
  Filled 2019-11-26: qty 20

## 2019-11-26 MED ORDER — CEFAZOLIN SODIUM-DEXTROSE 2-4 GM/100ML-% IV SOLN
2.0000 g | Freq: Three times a day (TID) | INTRAVENOUS | Status: AC
  Administered 2019-11-27 (×2): 2 g via INTRAVENOUS
  Filled 2019-11-26 (×2): qty 100

## 2019-11-26 MED ORDER — NALOXONE HCL 0.4 MG/ML IJ SOLN: 0.4000 mg | INTRAMUSCULAR | Status: DC | PRN

## 2019-11-26 MED ORDER — SODIUM CHLORIDE 0.9 % IV SOLN
INTRAVENOUS | Status: DC | PRN
  Administered 2019-11-26: 15:00:00 59.5 mL

## 2019-11-26 MED ORDER — SODIUM CHLORIDE 0.9 % IV SOLN
INTRAVENOUS | Status: DC
  Administered 2019-11-27 – 2019-11-29 (×3): via INTRAVENOUS

## 2019-11-26 MED ORDER — HYDROMORPHONE 1 MG/ML IV SOLN
INTRAVENOUS | Status: AC
  Filled 2019-11-26: qty 30

## 2019-11-26 MED ORDER — PHENYLEPHRINE HCL-NACL 10-0.9 MG/250ML-% IV SOLN
INTRAVENOUS | Status: DC | PRN
  Administered 2019-11-26: 25 ug/min via INTRAVENOUS

## 2019-11-26 MED ORDER — DIPHENHYDRAMINE HCL 50 MG/ML IJ SOLN: 12.5000 mg | Freq: Four times a day (QID) | INTRAMUSCULAR | Status: DC | PRN

## 2019-11-26 MED ORDER — PHENYLEPHRINE 40 MCG/ML (10ML) SYRINGE FOR IV PUSH (FOR BLOOD PRESSURE SUPPORT)
PREFILLED_SYRINGE | INTRAVENOUS | Status: DC | PRN
  Administered 2019-11-26 (×2): 80 ug via INTRAVENOUS

## 2019-11-26 MED ORDER — ROCURONIUM BROMIDE 10 MG/ML (PF) SYRINGE
PREFILLED_SYRINGE | INTRAVENOUS | Status: AC
  Filled 2019-11-26: qty 10

## 2019-11-26 MED ORDER — ONDANSETRON HCL 4 MG/2ML IJ SOLN
INTRAMUSCULAR | Status: DC | PRN
  Administered 2019-11-26: 4 mg via INTRAVENOUS

## 2019-11-26 MED ORDER — BUPIVACAINE HCL 0.5 % IJ SOLN
INTRAMUSCULAR | Status: DC | PRN
  Administered 2019-11-26: 25.5 mL

## 2019-11-26 MED ORDER — FENTANYL CITRATE (PF) 100 MCG/2ML IJ SOLN
50.0000 ug | Freq: Once | INTRAMUSCULAR | Status: AC
  Administered 2019-11-26: 50 ug via INTRAVENOUS

## 2019-11-26 MED ORDER — ONDANSETRON HCL 4 MG/2ML IJ SOLN: 4.0000 mg | Freq: Four times a day (QID) | INTRAMUSCULAR | Status: DC | PRN

## 2019-11-26 MED ORDER — ROCURONIUM BROMIDE 10 MG/ML (PF) SYRINGE
PREFILLED_SYRINGE | INTRAVENOUS | Status: DC | PRN
  Administered 2019-11-26: 100 mg via INTRAVENOUS

## 2019-11-26 MED ORDER — MIDAZOLAM HCL 2 MG/2ML IJ SOLN
2.0000 mg | Freq: Once | INTRAMUSCULAR | Status: AC
  Administered 2019-11-26: 2 mg via INTRAVENOUS

## 2019-11-26 SURGICAL SUPPLY — 129 items
ADAPTER VALVE BIOPSY EBUS (MISCELLANEOUS) IMPLANT
ADPTR VALVE BIOPSY EBUS (MISCELLANEOUS)
APPLICATOR COTTON TIP 6 STRL (MISCELLANEOUS) IMPLANT
APPLICATOR COTTON TIP 6IN STRL (MISCELLANEOUS)
APPLIER CLIP ROT 10 11.4 M/L (STAPLE)
BIT DRILL 7/64X5 DISP (BIT) IMPLANT
BLADE CLIPPER SURG (BLADE) IMPLANT
BRUSH CYTOL CELLEBRITY 1.5X140 (MISCELLANEOUS) IMPLANT
CANISTER SUCT 3000ML PPV (MISCELLANEOUS) ×8 IMPLANT
CATH KIT ON-Q SILVERSOAK 5IN (CATHETERS) IMPLANT
CATH ROBINSON RED A/P 22FR (CATHETERS) IMPLANT
CATH THORACIC 28FR (CATHETERS) IMPLANT
CATH THORACIC 28FR RT ANG (CATHETERS) IMPLANT
CATH THORACIC 36FR (CATHETERS) IMPLANT
CATH THORACIC 36FR RT ANG (CATHETERS) IMPLANT
CLIP APPLIE ROT 10 11.4 M/L (STAPLE) IMPLANT
CLIP VESOCCLUDE MED 6/CT (CLIP) ×4 IMPLANT
CONN ST 1/4X3/8  BEN (MISCELLANEOUS) ×2
CONN ST 1/4X3/8 BEN (MISCELLANEOUS) ×6 IMPLANT
CONN Y 3/8X3/8X3/8  BEN (MISCELLANEOUS) ×1
CONN Y 3/8X3/8X3/8 BEN (MISCELLANEOUS) ×3 IMPLANT
CONT SPEC 4OZ CLIKSEAL STRL BL (MISCELLANEOUS) ×20 IMPLANT
COVER BACK TABLE 60X90IN (DRAPES) ×4 IMPLANT
COVER SURGICAL LIGHT HANDLE (MISCELLANEOUS) ×4 IMPLANT
COVER WAND RF STERILE (DRAPES) ×4 IMPLANT
DERMABOND ADVANCED (GAUZE/BANDAGES/DRESSINGS) ×1
DERMABOND ADVANCED .7 DNX12 (GAUZE/BANDAGES/DRESSINGS) ×3 IMPLANT
DRAIN CHANNEL 28F RND 3/8 FF (WOUND CARE) ×8 IMPLANT
DRAIN CHANNEL 32F RND 10.7 FF (WOUND CARE) IMPLANT
DRAPE CV SPLIT W-CLR ANES SCRN (DRAPES) ×4 IMPLANT
DRAPE LAPAROSCOPIC ABDOMINAL (DRAPES) ×4 IMPLANT
DRAPE ORTHO SPLIT 77X108 STRL (DRAPES) ×1
DRAPE SLUSH/WARMER DISC (DRAPES) ×4 IMPLANT
DRAPE SURG ORHT 6 SPLT 77X108 (DRAPES) ×3 IMPLANT
DRAPE WARM FLUID 44X44 (DRAPES) ×4 IMPLANT
ELECT BLADE 6.5 EXT (BLADE) ×4 IMPLANT
ELECT REM PT RETURN 9FT ADLT (ELECTROSURGICAL) ×4
ELECTRODE REM PT RTRN 9FT ADLT (ELECTROSURGICAL) ×3 IMPLANT
FILTER STRAW FLUID ASPIR (MISCELLANEOUS) IMPLANT
FORCEPS BIOP RJ4 1.8 (CUTTING FORCEPS) IMPLANT
FORCEPS RADIAL JAW LRG 4 PULM (INSTRUMENTS) IMPLANT
GAUZE SPONGE 4X4 12PLY STRL (GAUZE/BANDAGES/DRESSINGS) ×4 IMPLANT
GAUZE SPONGE 4X4 12PLY STRL LF (GAUZE/BANDAGES/DRESSINGS) ×4 IMPLANT
GLOVE SURG SIGNA 7.5 PF LTX (GLOVE) ×12 IMPLANT
GLOVE SURG SS PI 6.0 STRL IVOR (GLOVE) ×4 IMPLANT
GLOVE TRIUMPH SURG SIZE 7.5 (KITS) ×4 IMPLANT
GOWN STRL REUS W/ TWL LRG LVL3 (GOWN DISPOSABLE) ×6 IMPLANT
GOWN STRL REUS W/ TWL XL LVL3 (GOWN DISPOSABLE) ×3 IMPLANT
GOWN STRL REUS W/TWL LRG LVL3 (GOWN DISPOSABLE) ×2
GOWN STRL REUS W/TWL XL LVL3 (GOWN DISPOSABLE) ×1
HEMOSTAT SURGICEL 2X14 (HEMOSTASIS) IMPLANT
INSERT FOGARTY 61MM (MISCELLANEOUS) IMPLANT
IV CATH 22GX1 FEP (IV SOLUTION) IMPLANT
KIT BASIN OR (CUSTOM PROCEDURE TRAY) ×4 IMPLANT
KIT CLEAN ENDO COMPLIANCE (KITS) ×4 IMPLANT
KIT SUCTION CATH 14FR (SUCTIONS) ×4 IMPLANT
KIT TURNOVER KIT B (KITS) ×4 IMPLANT
MARKER SKIN DUAL TIP RULER LAB (MISCELLANEOUS) ×4 IMPLANT
NEEDLE HYPO 25GX1X1/2 BEV (NEEDLE) ×4 IMPLANT
NEEDLE SPNL 18GX3.5 QUINCKE PK (NEEDLE) IMPLANT
NS IRRIG 1000ML POUR BTL (IV SOLUTION) ×12 IMPLANT
OIL SILICONE PENTAX (PARTS (SERVICE/REPAIRS)) ×4 IMPLANT
PACK CHEST (CUSTOM PROCEDURE TRAY) ×4 IMPLANT
PAD ARMBOARD 7.5X6 YLW CONV (MISCELLANEOUS) ×8 IMPLANT
POUCH ENDO CATCH II 15MM (MISCELLANEOUS) IMPLANT
POUCH SPECIMEN RETRIEVAL 10MM (ENDOMECHANICALS) IMPLANT
RADIAL JAW LRG 4 PULMONARY (INSTRUMENTS)
SCISSORS LAP 5X35 DISP (ENDOMECHANICALS) IMPLANT
SEALANT PROGEL (MISCELLANEOUS) IMPLANT
SEALANT SURG COSEAL 4ML (VASCULAR PRODUCTS) IMPLANT
SEALANT SURG COSEAL 8ML (VASCULAR PRODUCTS) IMPLANT
SOL ANTI FOG 6CC (MISCELLANEOUS) ×3 IMPLANT
SOLUTION ANTI FOG 6CC (MISCELLANEOUS) ×1
SPECIMEN JAR LG PLASTIC EMPTY (MISCELLANEOUS) IMPLANT
SPECIMEN JAR MEDIUM (MISCELLANEOUS) ×4 IMPLANT
SPONGE INTESTINAL PEANUT (DISPOSABLE) ×24 IMPLANT
SPONGE TONSIL TAPE 1 RFD (DISPOSABLE) ×4 IMPLANT
STAPLER VISISTAT 35W (STAPLE) IMPLANT
STOPCOCK 4 WAY LG BORE MALE ST (IV SETS) ×4 IMPLANT
SUT PROLENE 4 0 RB 1 (SUTURE)
SUT PROLENE 4 0 SH DA (SUTURE) IMPLANT
SUT PROLENE 4-0 RB1 .5 CRCL 36 (SUTURE) IMPLANT
SUT SILK  1 MH (SUTURE) ×2
SUT SILK 1 MH (SUTURE) ×6 IMPLANT
SUT SILK 1 TIES 10X30 (SUTURE) IMPLANT
SUT SILK 2 0 SH (SUTURE) IMPLANT
SUT SILK 2 0SH CR/8 30 (SUTURE) IMPLANT
SUT SILK 3 0 SH 30 (SUTURE) IMPLANT
SUT SILK 3 0SH CR/8 30 (SUTURE) IMPLANT
SUT VIC AB 1 CTX 36 (SUTURE)
SUT VIC AB 1 CTX36XBRD ANBCTR (SUTURE) IMPLANT
SUT VIC AB 2-0 CT1 27 (SUTURE)
SUT VIC AB 2-0 CT1 TAPERPNT 27 (SUTURE) IMPLANT
SUT VIC AB 2-0 CTX 36 (SUTURE) IMPLANT
SUT VIC AB 2-0 UR6 27 (SUTURE) IMPLANT
SUT VIC AB 3-0 MH 27 (SUTURE) IMPLANT
SUT VIC AB 3-0 SH 18 (SUTURE) IMPLANT
SUT VIC AB 3-0 SH 27 (SUTURE)
SUT VIC AB 3-0 SH 27X BRD (SUTURE) IMPLANT
SUT VIC AB 3-0 X1 27 (SUTURE) ×8 IMPLANT
SUT VICRYL 0 UR6 27IN ABS (SUTURE) ×4 IMPLANT
SUT VICRYL 2 TP 1 (SUTURE) IMPLANT
SWAB COLLECTION DEVICE MRSA (MISCELLANEOUS) IMPLANT
SWAB CULTURE ESWAB REG 1ML (MISCELLANEOUS) IMPLANT
SYR 10ML LL (SYRINGE) ×4 IMPLANT
SYR 20ML ECCENTRIC (SYRINGE) ×8 IMPLANT
SYR 20ML LL LF (SYRINGE) IMPLANT
SYR 50ML LL SCALE MARK (SYRINGE) ×4 IMPLANT
SYR 5ML LL (SYRINGE) ×4 IMPLANT
SYR 5ML LUER SLIP (SYRINGE) ×4 IMPLANT
SYR CONTROL 10ML LL (SYRINGE) IMPLANT
SYSTEM SAHARA CHEST DRAIN ATS (WOUND CARE) ×4 IMPLANT
TAPE CLOTH 4X10 WHT NS (GAUZE/BANDAGES/DRESSINGS) ×4 IMPLANT
TAPE CLOTH SURG 6X10 WHT LF (GAUZE/BANDAGES/DRESSINGS) ×4 IMPLANT
TIP APPLICATOR SPRAY EXTEND 16 (VASCULAR PRODUCTS) IMPLANT
TOWEL GREEN STERILE (TOWEL DISPOSABLE) ×4 IMPLANT
TOWEL GREEN STERILE FF (TOWEL DISPOSABLE) ×4 IMPLANT
TRAP SPECIMEN MUCOUS 40CC (MISCELLANEOUS) ×4 IMPLANT
TRAY FOLEY MTR SLVR 16FR STAT (SET/KITS/TRAYS/PACK) ×4 IMPLANT
TRAY FOLEY SLVR 16FR LF STAT (SET/KITS/TRAYS/PACK) ×4 IMPLANT
TROCAR XCEL BLADELESS 5X75MML (TROCAR) ×4 IMPLANT
TROCAR XCEL NON-BLD 5MMX100MML (ENDOMECHANICALS) IMPLANT
TUBE CONNECTING 20X1/4 (TUBING) ×8 IMPLANT
TUBING EXTENTION W/L.L. (IV SETS) ×4 IMPLANT
TUNNELER SHEATH ON-Q 11GX8 DSP (PAIN MANAGEMENT) IMPLANT
VALVE BIOPSY  SINGLE USE (MISCELLANEOUS) ×1
VALVE BIOPSY SINGLE USE (MISCELLANEOUS) ×3 IMPLANT
VALVE SUCTION BRONCHIO DISP (MISCELLANEOUS) ×4 IMPLANT
WATER STERILE IRR 1000ML POUR (IV SOLUTION) ×8 IMPLANT

## 2019-11-26 NOTE — Transfer of Care (Signed)
Immediate Anesthesia Transfer of Care Note  Patient: Timothy [name]  Procedure(s) Performed: VIDEO BRONCHOSCOPY (N/A Chest) VIDEO ASSISTED THORACOSCOPY (Left Chest) DRAINAGE OF PLEURAL EFFUSION (Left Chest) DECORTICATION (Left Chest)  Patient Location: PACU  Anesthesia Type:General  Level of Consciousness: awake, alert  and oriented  Airway & Oxygen Therapy: Patient Spontanous Breathing and Patient connected to face mask oxygen  Post-op Assessment: Report given to RN and Post -op Vital [name] reviewed and stable  Post vital [name]: Reviewed and stable  Last Vitals:  Vitals Value Taken Time  BP 130/87   Temp    Pulse 78   Resp 14   SpO2 97     Last Pain:  Vitals:   11/26/19 1208  TempSrc:   PainSc: 0-No pain         Complications: No apparent anesthesia complications

## 2019-11-26 NOTE — Anesthesia Procedure Notes (Signed)
Central Venous Catheter Insertion Performed by: Lillia Abed, MD, anesthesiologist Start/End12/08/2019 11:54 AM, 11/26/2019 12:04 PM Patient location: OR. Preanesthetic checklist: patient identified, IV checked, risks and benefits discussed, surgical consent, monitors and equipment checked, pre-op evaluation, timeout performed and anesthesia consent Position: Trendelenburg Lidocaine 1% used for infiltration and patient sedated Hand hygiene performed  and maximum sterile barriers used  Catheter size: 8 Fr Total catheter length 16. Central line was placed.Double lumen Procedure performed using ultrasound guided technique. Ultrasound Notes:anatomy identified, needle tip was noted to be adjacent to the nerve/plexus identified, no ultrasound evidence of intravascular and/or intraneural injection and image(s) printed for medical record Attempts: 1 Following insertion, dressing applied, line sutured and Biopatch. Post procedure assessment: blood return through all ports  Patient tolerated the procedure well with no immediate complications.

## 2019-11-26 NOTE — Brief Op Note (Addendum)
11/26/2019  3:51 PM  PATIENT:  Timothy [name]  78 y.o. male  PRE-OPERATIVE DIAGNOSIS:  LOCULATED LEFT PLEURAL EFFUSION  POST-OPERATIVE DIAGNOSIS:  LOCULATED LEFT PLEURAL EFFUSION  PROCEDURE:  Procedure(s): VIDEO BRONCHOSCOPY (N/A) VIDEO ASSISTED THORACOSCOPY (Left) DRAINAGE OF PLEURAL EFFUSION (Left) PLEURAL DECORTICATION (Left)  SURGEON:  Surgeon(s) and Role:    * [name], MD - Primary  PHYSICIAN ASSISTANT:   Enid Cutter, PA-C  Nicholes Rough, PA-C            ANESTHESIA:   general  EBL:  100 mL   BLOOD ADMINISTERED:none  DRAINS: Left pleural tube   LOCAL MEDICATIONS USED:  BUPIVICAINE   SPECIMEN:  Source of Specimen:  Pleural fluid, Pleural peel  DISPOSITION OF SPECIMEN:  PATHOLOGY  COUNTS:  YES  DICTATION: .Dragon Dictation  PLAN OF CARE: Admit to inpatient   PATIENT DISPOSITION:  PACU - hemodynamically stable.   Delay start of Pharmacological VTE agent (>24hrs) due to surgical blood loss or risk of bleeding: yes

## 2019-11-26 NOTE — Progress Notes (Signed)
Room is clean.  Attempted to bring pt.  RN is having an emergency with another pt.  Will call back again.

## 2019-11-26 NOTE — Anesthesia Procedure Notes (Addendum)
Procedure Name: Intubation Date/Time: 11/26/2019 1:57 PM Performed by: Junie Bame, RN Pre-anesthesia Checklist: Patient identified, Emergency Drugs available, Suction available and Patient being monitored Patient Re-evaluated:Patient Re-evaluated prior to induction Oxygen Delivery Method: Circle System Utilized Preoxygenation: Pre-oxygenation with 100% oxygen Induction Type: IV induction Laryngoscope Size: Mac and 4 Grade View: Grade I Tube type: Oral Endobronchial tube: Left, Double lumen EBT and EBT position confirmed by fiberoptic bronchoscope and 39 Fr Number of attempts: 1 Airway Equipment and Method: Stylet and Fiberoptic brochoscope Placement Confirmation: ETT inserted through vocal cords under direct vision,  positive ETCO2 and breath sounds checked- equal and bilateral Tube secured with: Tape Dental Injury: Teeth and Oropharynx as per pre-operative assessment

## 2019-11-26 NOTE — Anesthesia Procedure Notes (Signed)
Arterial Line Insertion Start/End12/08/2019 11:20 AM Performed by: Alain Marion, CRNA, CRNA  Preanesthetic checklist: patient identified, IV checked, site marked, risks and benefits discussed, surgical consent, monitors and equipment checked, pre-op evaluation, timeout performed and anesthesia consent Lidocaine 1% used for infiltration Right, radial was placed Catheter size: 20 G Hand hygiene performed  and maximum sterile barriers used   Attempts: 2 Procedure performed without using ultrasound guided technique. Following insertion, Biopatch and dressing applied. Post procedure assessment: normal and unchanged  Post procedure complications: local hematoma. Patient tolerated the procedure well with no immediate complications.

## 2019-11-26 NOTE — Progress Notes (Signed)
Waiting on room to be clean, I gave report to Floor RN [place] RN and gave report to [facility] PACU RN. 2 central said they will call PACU when room is ready.

## 2019-11-26 NOTE — Interval H&P Note (Signed)
History and Physical Interval Note:  11/26/2019 1:41 PM  Timothy [name]  has presented today for surgery, with the diagnosis of LOCULATED LEFT PLEURAL EFFUSION.  The various methods of treatment have been discussed with the patient and family. After consideration of risks, benefits and other options for treatment, the patient has consented to  Procedure(s): VIDEO BRONCHOSCOPY (N/A) VIDEO ASSISTED THORACOSCOPY (Left) POSSIBLE THORACOTOMY (Left) DRAINAGE OF PLEURAL EFFUSION (Left) POSSIBLE DECORTICATION (Left) as a surgical intervention.  The patient's history has been reviewed, patient examined, no change in status, stable for surgery.  I have reviewed the patient's chart and labs.  Questions were answered to the patient's satisfaction.     Timothy [name]

## 2019-11-26 NOTE — Anesthesia Procedure Notes (Addendum)
Procedure Name: Intubation Date/Time: 11/26/2019 1:50 PM Performed by: Junie Bame, RN Pre-anesthesia Checklist: Patient identified, Emergency Drugs available, Suction available and Patient being monitored Patient Re-evaluated:Patient Re-evaluated prior to induction Oxygen Delivery Method: Circle System Utilized Preoxygenation: Pre-oxygenation with 100% oxygen Induction Type: IV induction Ventilation: Mask ventilation without difficulty and Oral airway inserted - appropriate to patient size Laryngoscope Size: Mac and 4 Grade View: Grade I Tube type: Oral Tube size: 8.5 mm Number of attempts: 1 Airway Equipment and Method: Stylet and Oral airway Placement Confirmation: ETT inserted through vocal cords under direct vision,  positive ETCO2 and breath sounds checked- equal and bilateral Secured at: 23 cm Tube secured with: Tape Dental Injury: Teeth and Oropharynx as per pre-operative assessment

## 2019-11-26 NOTE — Op Note (Signed)
NAME: Timothy [name], Timothy [name] MEDICAL RECORD [id number] ACCOUNT [id number] DATE OF BIRTH:[birth year] FACILITY: [facility] LOCATION: [facility] PHYSICIAN: Chaya Jan, MD  OPERATIVE REPORT  DATE OF PROCEDURE:  11/26/2019  PREOPERATIVE DIAGNOSIS:  Loculated left pleural effusion.  POSTOPERATIVE DIAGNOSIS:  Loculated left pleural effusion.  PROCEDURES:   1.  Video bronchoscopy. 2.  Left video-assisted thoracoscopy. 3.  Drainage of pleural effusion.   4.  Visceral and parietal pleural decortication. 5.  Intercostal nerve blocks.  SURGEON:  Modesto Charon, MD  ASSISTANT:  Enid Cutter,  PA-C  SECOND ASSISTANT:  Nicholes Rough, PA   ANESTHESIA:  General.  FINDINGS:  Relatively small pleural effusion and some gelatinous exudate.  Thick pleural peel encasing the lower lobe.  Good reexpansion of the lung post-decortication.  Frozen section revealed inflammation, no evidence of malignancy.  CLINICAL NOTE:  Timothy [name] is a 78 year old man who developed left-sided pleuritic chest pain and left shoulder pain about a month ago.  He had a single fever.  A chest x-ray showed a pleural effusion.  He had a thoracentesis, which drained 400 mL of  fluid.  Findings were consistent with exudate.  There was no evidence of malignancy on cytology.  He was treated for presumed parapneumonic effusion; however, he had persistent loculated pleural fluid.  A repeat thoracentesis was attempted, but only 5 mL  of fluid could be evacuated.  He was referred for surgical drainage.  he was offered the options of conservative therapy versus catheter and thrombolytic drainage versus surgical decortication.  The indications, risks, benefits and  alternatives, as well as the advantages and disadvantages of each of the approaches were discussed with the patient.  He wished to proceed with surgical drainage.  OPERATIVE NOTE:  Timothy [name] was brought to the preoperative holding area on 11/26/2019.  Anesthesia placed a  central venous catheter and an arterial blood pressure monitoring line.  He was taken to the operating room, anesthetized and intubated with a  single lumen endotracheal tube.  Flexible fiberoptic bronchoscopy was performed via the endotracheal tube.  It revealed normal endobronchial anatomy.  There were no endobronchial lesions.  There was some extrinsic compression on the lateral segmental  bronchus in the left lower lobe, but no endobronchial lesion.  The airways beyond this were clear.  The patient then was reintubated with a double lumen endotracheal tube.  Intravenous antibiotics were administered.  A Foley catheter was placed.  Sequential compression devices were placed on the calves for DVT prophylaxis.  He was placed in a right  lateral decubitus position and the left chest was prepped and draped in the usual sterile fashion.  Single-lung ventilation of the right lung was initiated and was tolerated well throughout the procedure.  A timeout was performed.  A solution containing 20 mL of liposomal bupivacaine, 30 mL of 0.5% bupivacaine and 50 mL of saline was prepared.  This was used for local anesthesia at the skin sites, as well as for the intercostal nerve blocks.  An incision was made in the 8th interspace in the mid axillary line.  A sucker was advanced into the chest and a small amount of fluid was evacuated.  This was sent for both cytology as well as bacterial, AFB and fungal cultures.  A 5 cm working incision  was made in the 5th interspace anterolaterally.  No rib spreading was performed during the procedure.  A thoracoscope was advanced through a port in the 8th interspace incision.  The lung was adherent to the chest wall and diaphragm.  The initial  dissection was done bluntly using the sucker and a small space with approximately 100 mL of serous fluid was evacuated.  There was gelatinous debris in the area as well, which was removed.  There was a second loculated area of fluid more   superiorly.  The lower lobe was encased in a dense fibrous peel.  The upper lobe was free of involvement except around the lingula inferiorly and along the fissure.  Once the lung had been freed up circumferentially, visceral pleural decortication was  performed of the lower lobe and the fissure as well as the lingula.  The tissue was very dense and fibrous and thick.  It was difficult to develop a plane, but once a plane was developed, the peel tend to come off in relatively large pieces.  In some  areas, this was very thick dense tissue, particularly inferiorly along the diaphragm and 2 separate sites that were thicker and more suspicious were sent for frozen section.  Both returned showing only inflammation with no tumor seen.  The dissection was  long and tedious, particularly along the diaphragm and the costophrenic angle.  The dissection was relatively slow, but ultimately the lower lobe was well decorticated.  The fissure also was decorticated.  A test inflation showed good reexpansion,  except along the diaphragmatic surface of the lower lobe. There was scarring in that area that was more of a cicatrix and not amenable to peeling off.  The scar in this area was scored with electrocautery through the pleura to allow better reexpansion  of the lower lobe.  Exudate was removed from the parietal pleural surface, but there was relatively minimal involvement of the parietal pleural surface.  The chest was copiously irrigated with warm saline on multiple occasions.  Again, a test inflation  showed good reexpansion of both the upper and lower lobes.  Two 28-French Blake drains then were placed through the original port incision as well as a second port incision more anteriorly.  One was placed along the diaphragm and one was placed more  posteriorly into the paravertebral gutter.  Both were secured to skin with #1 silk sutures.  The working incision was closed in 3 layers.  The chest tubes were placed to  suction.  Dual-lung ventilation was resumed.  The patient then was extubated in the  operating room and taken to the postanesthetic care unit in good condition.  VN/NUANCE  D:11/26/2019 T:11/26/2019 JOB:[id number]

## 2019-11-26 NOTE — Anesthesia Postprocedure Evaluation (Incomplete)
Anesthesia Post Note  Patient: Timothy [name]  Procedure(s) Performed: VIDEO BRONCHOSCOPY (N/A Chest) VIDEO ASSISTED THORACOSCOPY (Left Chest) DRAINAGE OF PLEURAL EFFUSION (Left Chest) DECORTICATION (Left Chest)     Anesthesia Post Evaluation  Last Vitals:  Vitals:   11/26/19 0956 11/26/19 1208  BP: (!) 141/49 (!) 119/50  Pulse: 91 83  Resp: 18 15  Temp: 36.7 C   SpO2: 96% 95%    Last Pain:  Vitals:   11/26/19 1208  TempSrc:   PainSc: 0-No pain                 [place]

## 2019-11-27 ENCOUNTER — Inpatient Hospital Stay (HOSPITAL_COMMUNITY): Payer: Medicare Other

## 2019-11-27 LAB — BASIC METABOLIC PANEL
Anion gap: 8 (ref 5–15)
BUN: 16 mg/dL (ref 8–23)
CO2: 28 mmol/L (ref 22–32)
Calcium: 8.3 mg/dL — ABNORMAL LOW (ref 8.9–10.3)
Chloride: 101 mmol/L (ref 98–111)
Creatinine, Ser: 0.9 mg/dL (ref 0.61–1.24)
GFR calc Af Amer: >60 mL/min (ref >60)
GFR calc non Af Amer: >60 mL/min (ref >60)
Glucose, Bld: 133 mg/dL — ABNORMAL HIGH (ref 70–99)
Potassium: 4.7 mmol/L (ref 3.5–5.1)
Sodium: 137 mmol/L (ref 135–145)

## 2019-11-27 LAB — CBC
HCT: 31.7 % — ABNORMAL LOW (ref 39.0–52.0)
Hemoglobin: 10.2 g/dL — ABNORMAL LOW (ref 13.0–17.0)
MCH: 30 pg (ref 26.0–34.0)
MCHC: 32.2 g/dL (ref 30.0–36.0)
MCV: 93.2 fL (ref 80.0–100.0)
Platelets: 303 10*3/uL (ref 150–400)
RBC: 3.4 MIL/uL — ABNORMAL LOW (ref 4.22–5.81)
RDW: 13.1 % (ref 11.5–15.5)
WBC: 8.3 10*3/uL (ref 4.0–10.5)
nRBC: 0 % (ref 0.0–0.2)

## 2019-11-27 LAB — ACID FAST SMEAR (AFB, MYCOBACTERIA)
Acid Fast Smear: NEGATIVE
Acid Fast Smear: NEGATIVE
Acid Fast Smear: NEGATIVE

## 2019-11-27 MED ORDER — ONDANSETRON HCL 4 MG/2ML IJ SOLN: 4.0000 mg | Freq: Four times a day (QID) | INTRAMUSCULAR | Status: DC | PRN

## 2019-11-27 MED ORDER — ALBUMIN HUMAN 5 % IV SOLN
12.5000 g | Freq: Once | INTRAVENOUS | Status: AC
  Administered 2019-11-27: 12.5 g via INTRAVENOUS
  Filled 2019-11-27: qty 250

## 2019-11-27 MED ORDER — TRAMADOL HCL 50 MG PO TABS
50.0000 mg | ORAL_TABLET | Freq: Four times a day (QID) | ORAL | Status: DC | PRN
  Administered 2019-11-28: 100 mg via ORAL
  Administered 2019-11-29: 50 mg via ORAL
  Filled 2019-11-27: qty 1
  Filled 2019-11-27: qty 2

## 2019-11-27 MED ORDER — ASPIRIN 81 MG PO CHEW
81.0000 mg | CHEWABLE_TABLET | Freq: Every day | ORAL | Status: DC
  Administered 2019-11-27 – 2019-11-30 (×4): 81 mg via ORAL
  Filled 2019-11-27 (×4): qty 1

## 2019-11-27 MED ORDER — CHLORHEXIDINE GLUCONATE CLOTH 2 % EX PADS
6.0000 | MEDICATED_PAD | Freq: Every day | CUTANEOUS | Status: DC
  Administered 2019-11-27 – 2019-11-30 (×3): 6 via TOPICAL

## 2019-11-27 MED ORDER — IPRATROPIUM-ALBUTEROL 20-100 MCG/ACT IN AERS
1.0000 | INHALATION_SPRAY | Freq: Three times a day (TID) | RESPIRATORY_TRACT | Status: DC
  Administered 2019-11-27: 1 via RESPIRATORY_TRACT
  Filled 2019-11-27: qty 4

## 2019-11-27 MED ORDER — IPRATROPIUM-ALBUTEROL 0.5-2.5 (3) MG/3ML IN SOLN
3.0000 mL | Freq: Four times a day (QID) | RESPIRATORY_TRACT | Status: DC
  Filled 2019-11-27: qty 3

## 2019-11-27 MED ORDER — SODIUM CHLORIDE 0.9 % IV SOLN: INTRAVENOUS | Status: DC | PRN

## 2019-11-27 MED ORDER — ACETAMINOPHEN 160 MG/5ML PO SOLN: 1000.0000 mg | Freq: Four times a day (QID) | ORAL | Status: DC

## 2019-11-27 MED ORDER — IPRATROPIUM-ALBUTEROL 20-100 MCG/ACT IN AERS
1.0000 | INHALATION_SPRAY | Freq: Three times a day (TID) | RESPIRATORY_TRACT | Status: DC
  Administered 2019-11-28 – 2019-11-29 (×2): 1 via RESPIRATORY_TRACT
  Filled 2019-11-27: qty 4

## 2019-11-27 MED ORDER — BISACODYL 5 MG PO TBEC
10.0000 mg | DELAYED_RELEASE_TABLET | Freq: Every day | ORAL | Status: DC
  Administered 2019-11-27 – 2019-11-30 (×4): 10 mg via ORAL
  Filled 2019-11-27 (×5): qty 2

## 2019-11-27 MED ORDER — MONTELUKAST SODIUM 10 MG PO TABS
10.0000 mg | ORAL_TABLET | Freq: Every day | ORAL | Status: DC
  Administered 2019-11-27 – 2019-11-30 (×4): 10 mg via ORAL
  Filled 2019-11-27 (×4): qty 1

## 2019-11-27 MED ORDER — IRBESARTAN 150 MG PO TABS
150.0000 mg | ORAL_TABLET | Freq: Every day | ORAL | Status: DC
  Administered 2019-11-27: 150 mg via ORAL
  Filled 2019-11-27: qty 1

## 2019-11-27 MED ORDER — ENOXAPARIN SODIUM 40 MG/0.4ML ~~LOC~~ SOLN
40.0000 mg | SUBCUTANEOUS | Status: DC
  Administered 2019-11-27 – 2019-11-29 (×3): 40 mg via SUBCUTANEOUS
  Filled 2019-11-27 (×3): qty 0.4

## 2019-11-27 MED ORDER — SENNOSIDES-DOCUSATE SODIUM 8.6-50 MG PO TABS
1.0000 | ORAL_TABLET | Freq: Every day | ORAL | Status: DC
  Administered 2019-11-27 – 2019-11-28 (×2): 1 via ORAL
  Filled 2019-11-27 (×2): qty 1

## 2019-11-27 MED ORDER — ACETAMINOPHEN 500 MG PO TABS
1000.0000 mg | ORAL_TABLET | Freq: Four times a day (QID) | ORAL | Status: DC
  Administered 2019-11-27 – 2019-11-30 (×8): 1000 mg via ORAL
  Filled 2019-11-27 (×9): qty 2

## 2019-11-27 MED ORDER — METOPROLOL SUCCINATE ER 25 MG PO TB24
12.5000 mg | ORAL_TABLET | Freq: Every day | ORAL | Status: DC
  Administered 2019-11-29 – 2019-11-30 (×2): 12.5 mg via ORAL
  Filled 2019-11-27 (×4): qty 1

## 2019-11-27 MED ORDER — HYPROMELLOSE (GONIOSCOPIC) 2.5 % OP SOLN: 1.0000 [drp] | Freq: Three times a day (TID) | OPHTHALMIC | Status: DC | PRN

## 2019-11-27 NOTE — Progress Notes (Addendum)
1 Day Post-Op Procedure(s) (LRB): VIDEO BRONCHOSCOPY (N/A) VIDEO ASSISTED THORACOSCOPY (Left) DRAINAGE OF PLEURAL EFFUSION (Left) DECORTICATION (Left) Subjective: Awake and alert, says pain control is adequate.  Objective: Vital signs in last 24 hours: Temp:  [97.9 F (36.6 C)-98.1 F (36.7 C)] 97.9 F (36.6 C) (12/09 2335) Pulse Rate:  [66-91] 71 (12/10 0500) Cardiac Rhythm: Normal sinus rhythm (12/09 2352) Resp:  [10-20] 12 (12/10 0500) BP: (94-141)/(49-96) 94/49 (12/10 0500) SpO2:  [91 %-99 %] 95 % (12/10 0500) Arterial Line BP: (58-165)/(40-85) 97/40 (12/10 0500) Weight:  [100 kg] 100 kg (12/09 1021)     Intake/Output from previous day: 12/09 0701 - 12/10 0700 In: 1550 [I.V.:1300] Out: 1110 [Urine:650; Blood:200; Chest Tube:260] Intake/Output this shift: No intake/output data recorded.  General appearance: alert, cooperative and no distress Neurologic: intact Heart: SR Lungs: breath sounds clear, diminished left base Wound: dressing dry.   Lab Results: Recent Labs    11/24/19 1109 11/26/19 1132 11/27/19 0340  WBC 9.1  --  8.3  HGB 12.3* 11.2* 10.2*  HCT 39.5 33.0* 31.7*  PLT 360  --  303   BMET:  Recent Labs    11/24/19 1109 11/26/19 1132 11/27/19 0340  NA 139 138 137  K 3.9 4.1 4.7  CL 102  --  101  CO2 25  --  28  GLUCOSE 106*  --  133*  BUN 15  --  16  CREATININE 0.97  --  0.90  CALCIUM 9.1  --  8.3*    PT/INR:  Recent Labs    11/24/19 1109  LABPROT 15.3*  INR 1.2   ABG    Component Value Date/Time   PHART 7.455 (H) 11/26/2019 1132   HCO3 26.9 11/26/2019 1132   TCO2 28 11/26/2019 1132   O2SAT 90.0 11/26/2019 1132   CBG (last 3)  No results for input(s): GLUCAP in the last 72 hours.  Assessment/Plan: S/P Procedure(s) (LRB): VIDEO BRONCHOSCOPY (N/A) VIDEO ASSISTED THORACOSCOPY (Left) DRAINAGE OF PLEURAL EFFUSION (Left) DECORTICATION (Left)  -POD1 left VATS / decortication for loculated pleural effusion. Chest tube drainage  only 137ml past 12 hours. No air leak. Will leave CT to suction for 48 hours post op. Cx and path pending.   -History of HTN- restarting metoprolol and irbesartan today  -Dyslipidemia- resume statin  -DVT PPX- start [place] enoxaparin today.    LOS: 1 day    Antony Odea, PA-C [phone] 11/27/2019   Patient seen and examined, agree with above Keep CT to suction today Deconditioning- mobilize, ask PT to see  Remo Lipps C. Roxan Hockey, MD [facility] [phone]

## 2019-11-27 NOTE — Anesthesia Postprocedure Evaluation (Signed)
Anesthesia Post Note  Patient: Timothy [name]  Procedure(s) Performed: VIDEO BRONCHOSCOPY (N/A Chest) VIDEO ASSISTED THORACOSCOPY (Left Chest) DRAINAGE OF PLEURAL EFFUSION (Left Chest) DECORTICATION (Left Chest)     Patient location during evaluation: PACU Anesthesia Type: General Level of consciousness: awake and alert, oriented and patient cooperative Pain management: pain level controlled Vital Signs Assessment: post-procedure vital signs reviewed and stable Respiratory status: spontaneous breathing, nonlabored ventilation and respiratory function stable Cardiovascular status: blood pressure returned to baseline and stable Postop Assessment: no apparent nausea or vomiting Anesthetic complications: no    Last Vitals:  Vitals:   11/27/19 0400 11/27/19 0500  BP: (!) 114/56 (!) 94/49  Pulse: 69 71  Resp: 11 12  Temp:    SpO2: 97% 95%    Last Pain:  Vitals:   11/26/19 2335  TempSrc: Oral  PainSc:                  Pervis Hocking

## 2019-11-27 NOTE — Progress Notes (Signed)
Changed dressing on chest tube, site is clean dry and intact. No complaints of pain.

## 2019-11-27 NOTE — Evaluation (Signed)
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  He was in his usual state of health until about a month ago when he developed left-sided chest pain and left shoulder pain.  Work-up revealed a left pleural effusion.  Thoracentesis revealed the fluid to be an exudate. Now s/p L VATS/decortication.  Clinical Impression  Patient presents with decreased mobility due to deconditioning from prior issues at home when SOB and due to post surgical weakness, though does not c/o pain.  Feel he will benefit from skilled PT in the acute setting to allow return home with family support.  Likely not to need follow up PT, but may benefit from outpatient pulmonary rehab.      Follow Up Recommendations No PT follow up    Equipment Recommendations  None recommended by PT    Recommendations for Other Services       Precautions / Restrictions Precautions Precautions: Fall Precaution Comments: fall risk due to multiple lines Restrictions Weight Bearing Restrictions: No      Mobility  Bed Mobility Overal bed mobility: Needs Assistance Bed Mobility: Supine to Sit     Supine to sit: HOB elevated;Supervision     General bed mobility comments: increased time and assist for multiple lines  Transfers Overall transfer level: Needs assistance   Transfers: Sit to/from Stand;Stand Pivot Transfers Sit to Stand: Min guard Stand pivot transfers: Min guard       General transfer comment: assist up from bed and to step to chair only due to muliple lines; able to stand and step to chair with time and cues  Ambulation/Gait                Stairs            Wheelchair Mobility    Modified  Rank[place] (Stroke Patients Only)       Balance Overall balance assessment: Needs assistance Sitting-balance support: Feet supported Sitting balance-Leahy Scale: Good     Standing balance support: No upper extremity supported Standing balance-Leahy Scale: Fair Standing balance comment: standing beside bed with S no UE support, minguard for mobilizing                             Pertinent Vitals/Pain Pain Assessment: Faces Faces Pain Scale: No hurt    Home Living Family/patient expects to be discharged to:: Private residence Living Arrangements: Spouse/significant other Available Help at Discharge: Family Type of Home: House Home Access: Stairs to enter Entrance Stairs-Rails: None Entrance Stairs-Number of Steps: 1 Home Layout: One level Home Equipment: Environmental consultant - 2 wheels;Cane - single point;Grab bars - tub/shower Additional Comments: has access to equipment from mother in law who is living with them temporarily    Prior Function Level of Independence: Independent               Hand Dominance        Extremity/Trunk Assessment   Upper Extremity Assessment Upper Extremity Assessment: Overall WFL for tasks assessed    Lower Extremity Assessment Lower Extremity Assessment: Overall WFL for tasks assessed       Communication   Communication: HOH  Cognition Arousal/Alertness: Awake/alert Behavior During Therapy: WFL for tasks assessed/performed Overall Cognitive Status: Within Functional  Limits for tasks assessed                                        General Comments General comments (skin integrity, edema, etc.): VSS on RA SpO2 93%    Exercises     Assessment/Plan    PT Assessment Patient needs continued PT services  PT Problem List Decreased activity tolerance;Decreased mobility;Decreased safety awareness;Cardiopulmonary status limiting activity;Decreased balance;Decreased knowledge of precautions;Decreased knowledge of use of  DME       PT Treatment Interventions Stair training;DME instruction;Therapeutic activities;Balance training;Cognitive remediation;Wheelchair mobility training;Manual techniques;Therapeutic exercise;Functional mobility training;Gait training    PT Goals (Current goals can be found in the Care Plan section)  Acute Rehab PT Goals Patient Stated Goal: to go home PT Goal Formulation: With patient Time For Goal Achievement: 12/11/19 Potential to Achieve Goals: Good    Frequency Min 3X/week   Barriers to discharge        Co-evaluation               AM-PAC PT "6 Clicks" Mobility  Outcome Measure Help needed turning from your back to your side while in a flat bed without using bedrails?: None Help needed moving from lying on your back to sitting on the side of a flat bed without using bedrails?: None Help needed moving to and from a bed to a chair (including a wheelchair)?: A Little Help needed standing up from a chair using your arms (e.g., wheelchair or bedside chair)?: A Little Help needed to walk in hospital room?: A Little Help needed climbing 3-5 steps with a railing? : A Little 6 Click Score: 20    End of Session Equipment Utilized During Treatment: Gait belt Activity Tolerance: Patient tolerated treatment well Patient left: in chair;with call bell/phone within reach;with family/visitor present Nurse Communication: Mobility status PT Visit Diagnosis: Muscle weakness (generalized) (M62.81);Difficulty in walking, not elsewhere classified (R26.2)    Time: [id number] PT Time Calculation (min) (ACUTE ONLY): 27 min   Charges:   PT Evaluation $PT Eval Moderate Complexity: 1 Mod PT Treatments $Therapeutic Activity: 8-22 mins        Magda Kiel, [place] [facility] [phone] 11/27/2019   Reginia Naas 11/27/2019, 12:36 PM

## 2019-11-27 NOTE — Discharge Instructions (Signed)

## 2019-11-27 NOTE — Discharge Summary (Addendum)
Physician Discharge Summary  Patient ID: Timothy [name] MRN: [id number] DOB/AGE: 02/13/1941 78 y.o.  Admit date: 11/26/2019 Discharge date: 11/30/2019  Admission Diagnoses: Loculated left pleural effusion Hypertension Dyslipidemia Coronary artery Disease Arthritis History of kidney stones  Discharge Diagnoses:   S/P thoracotomy, decortication Organized empyema Hypertension Dyslipidemia Coronary artery Disease Arthritis History of kidney stones   Discharged Condition: stable  History of Present Illness: Timothy [name] is a 78 year old man with a past medical history significant for tobacco abuse, coronary disease, atrial fibrillation, hypertension, hyperlipidemia, and arthritis.  He was in his usual state of health until about a month ago when he developed left-sided chest pain and left shoulder pain.  Work-up revealed a left pleural effusion.  Thoracentesis revealed the fluid to be an exudate.  Cytology was negative for malignancy.  A second thoracentesis was attempted but was unsuccessful.  I had a long discussion with Timothy [name].  We reviewed the CT and chest x-ray images.  We discussed the differential diagnosis.  That includes infectious, inflammatory, and malignant etiologies.  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  Of note although still present his loculated effusion on his chest x-ray today is smaller than it was 2 weeks ago.  That would be highly unusual for malignant effusion.  The most likely scenario is that he had pneumonia and this is a parapneumonic effusion.  I discussed 3 options with Timothy [name].  The first option would be conservative with continued observation and medical therapy.  Another round of steroids would be an option.  An intermediate option would be to admit him and place a pigtail catheter and then attempt thrombolytic drainage of the remainder of the effusion.  I think there is  a reasonable chance that could work there is no guarantee.  There also is a possibility we will get a definitive diagnosis.  Finally, the most aggressive option would be to do a left VATS for drainage of the effusion and decortication.  While that should allow [place] to definitively rule in or out malignancy it is a major operation with significant risk of morbidity and mortality.  I discussed the general nature of the surgery with Timothy [name].  They understand this would be done in the operating room under general anesthesia, the incisions to be used, the use of drainage tubes postoperatively, the expected hospital stay, and the overall recovery time.  I informed them of the indications, risks, benefits, and alternatives.  They understand the risks include, but are not limited to death, MI, DVT, PE, bleeding, possible need for transfusion, infection, prolonged air leak, atrial fibrillation or other cardiac arrhythmias, as well as the possibility of other unforeseeable complications.  Timothy [name] decided to proceed with surgery.   Hospital Course:  Timothy [name] presented for same-day surgery on 11/26/19 and was taken to the OR where left VATS, drainage of a loculated pleural effusion, and pleural decortication was  Performed without complication. Following the procedure, he was extubated and was initially recovered in the PACU. He was transferred to [facility] where he remained stable. He had no air leak on the first post-op day but the chest tubes were left to suction in order to promote pleurodesis. He was mobilized with the assistance of physical therapy and made satisfactory progress with mobility. He was weaned from supplemental O2 without difficulty. He had minimal air leak that quickly subsided and the chest tubes were removed  without complication.   The follow up CXR showed stable left lower pleural thickening, no PTX.  The path on the pleural Bx's was negative for malignancy. The  pleural fluid Cx's, KOH, and fungal preps were all negative.   Consults: None  Significant Diagnostic Studies:  EXAM: CT CHEST WITH CONTRAST  TECHNIQUE: Multidetector CT imaging of the chest was performed during intravenous contrast administration.  CONTRAST:  83mL OMNIPAQUE IOHEXOL 300 MG/ML  SOLN  COMPARISON:  Chest radiograph dated 11/04/2019. CT chest dated 01/30/2017.  FINDINGS: Cardiovascular: The heart is normal in size. No pericardial effusion.  No evidence of thoracic aortic aneurysm. Atherosclerotic calcifications of the aortic arch.  Three vessel coronary atherosclerosis.  Mediastinum/Nodes: No suspicious mediastinal lymphadenopathy.  8 mm left thyroid nodule (series 2/image 13).  Lungs/Pleura: Mild centrilobular and paraseptal emphysematous changes, upper lung predominant.  Moderate left pleural effusion. Tiny foci of gas (series 2/image 104), likely related to recent thoracentesis. No pneumothorax.  Lingular and left lower lobe atelectasis. Associated volume loss in the left hemithorax.  No suspicious pulmonary nodules.  No pleural effusion or pneumothorax.  Upper Abdomen: Visualized upper abdomen is notable for nodular thickening of the left adrenal gland and nonobstructing left renal calculi measuring up to 8 mm in the lower pole (series 2/image 131).  Musculoskeletal: Degenerative changes of the visualized thoracolumbar spine.  IMPRESSION: Moderate left pleural effusion. Tiny foci of gas, likely related to recent thoracentesis. No pneumothorax.  Lingular and left lower lobe atelectasis. Associated volume loss in the left hemithorax.  Aortic Atherosclerosis (ICD10-I70.0) and Emphysema (ICD10-J43.9).   Electronically Signed   By: Julian Hy M.D.   On: 11/10/2019 16:52     Treatments:  OPERATIVE REPORT  DATE OF PROCEDURE:  11/26/2019  PREOPERATIVE DIAGNOSIS:  Loculated left pleural  effusion.  POSTOPERATIVE DIAGNOSIS:  Loculated left pleural effusion.  PROCEDURES:   1.  Video bronchoscopy. 2.  Left video-assisted thoracoscopy. 3.  Drainage of pleural effusion.   4.  Visceral and parietal pleural decortication. 5.  Intercostal nerve block.  SURGEON:  Modesto Charon, MD  ASSISTANT:  Enid Cutter,  PA-C  SECOND ASSISTANT:  Nicholes Rough, PA   ANESTHESIA:  General.  FINDINGS:  Relatively small pleural effusion and some gelatinous exudate.  Thick pleural peel encasing the lower lobe.  Good reexpansion of the lung post-decortication.  Frozen section revealed inflammation, no evidence of malignancy.  CLINICAL NOTE:  The patient is a 78 year old man who developed left-sided pleuritic chest pain and left shoulder pain about a month ago.  He had a single fever.  A chest x-ray showed a pleural effusion.  He had a thoracentesis, which drained 400 mL of  fluid.  Findings were consistent with exudate.  There was no evidence of malignancy on cytology.  He was treated for presumed parapneumonic effusion; however, he had persistent loculated pleural fluid.  A repeat thoracentesis was attempted, but only 5 mL  of fluid could be evacuated.  The patient was referred for surgical drainage.  The patient was offered the options of conservative therapy versus catheter and thrombolytic drainage versus surgical decortication.  The indications, risks, benefits and  alternatives, as well as the advantages and disadvantages of each of the approaches were discussed with the patient.  He wished to proceed with surgical resection.   Discharge Exam: Blood pressure (!) 147/68, pulse 88, temperature 98.4 F (36.9 C), temperature source Oral, resp. rate 17, height 6\' 2"  (1.88 m), weight 100 kg, SpO2 93 %.  Physical Exam: General appearance:alert,  cooperative and no distress Neurologic:intact Heart:SR Lungs:breath sounds clear, diminished left base. CXR shows stable left lower  pleural thickening and density unchanged. No PTX.  Wound:the thoracotomy incision is well approximated and dry. The CT site is covered with a dry bulky dressing.   Disposition:    Allergies as of 11/30/2019   No Known Allergies     Medication List    TAKE these medications   AIRBORNE PO Take 1 tablet by mouth at bedtime.   albuterol 108 (90 Base) MCG/ACT inhaler Commonly known as: VENTOLIN HFA Inhale 2 puffs into the lungs every 6 (six) hours as needed for wheezing or shortness of breath.   aspirin 81 MG tablet Take 1 tablet (81 mg total) by mouth daily.   Calcium Carbonate-Vitamin D 600-400 MG-UNIT chew tablet Chew 2 tablets by mouth daily.   hydroxypropyl methylcellulose / hypromellose 2.5 % ophthalmic solution Commonly known as: ISOPTO TEARS / GONIOVISC Place 1 drop into both eyes 3 (three) times daily as needed for dry eyes.   metoprolol succinate 25 MG 24 hr tablet Commonly known as: Toprol XL Take 0.5 tablets (12.5 mg total) by mouth daily.   montelukast 10 MG tablet Commonly known as: SINGULAIR Take 1 tablet (10 mg total) by mouth daily.   telmisartan 40 MG tablet Commonly known as: Micardis Take 1 tablet (40 mg total) by mouth daily.   traMADol 50 MG tablet Commonly known as: ULTRAM Take 1 tablet (50 mg total) by mouth every 6 (six) hours as needed for up to 7 days (mild pain).   Turmeric 500 MG Tabs Take 500 mg by mouth daily.      Follow-up Information    [name], MD. Go on 12/23/2019.   Specialty: Cardiothoracic Surgery Why: You have an appointment with Dr. Roxan Hockey on Tuesday, 12/23/19 at 12:30pm.  Please arrive 30 minutes early for a chest x-ray to be performed by [facility] imaging located on the first floor of the same building.  Contact information: [address] [place] [address] [phone]           Signed: Antony Odea, PA-C 11/30/2019, 8:19 AM

## 2019-11-27 NOTE — Progress Notes (Signed)
Patients blood pressure low this afternoon, 79/40,83/41, 108/54. Patient complains of blurry vision. Myrah Roddenburg paged, returned call and ordered to increase NS to 33mL/hr and administer Albumin 5% at 250cc. Will start albumin when pharmacy has medication ready and will continue to monitor.

## 2019-11-27 NOTE — Plan of Care (Signed)
  Problem: Education: Goal: Knowledge of disease or condition will improve Outcome: Progressing Goal: Knowledge of the prescribed therapeutic regimen will improve Outcome: Progressing   Problem: Activity: Goal: Risk for activity intolerance will decrease Outcome: Progressing   Problem: Cardiac: Goal: Will achieve and/or maintain hemodynamic stability Outcome: Progressing   Problem: Respiratory: Goal: Respiratory status will improve Outcome: Progressing   

## 2019-11-27 NOTE — Progress Notes (Signed)
Foley removed, patient tolerated well.

## 2019-11-28 ENCOUNTER — Inpatient Hospital Stay (HOSPITAL_COMMUNITY): Payer: Medicare Other

## 2019-11-28 LAB — COMPREHENSIVE METABOLIC PANEL
ALT: 12 U/L (ref 0–44)
AST: 21 U/L (ref 15–41)
Albumin: 2.2 g/dL — ABNORMAL LOW (ref 3.5–5.0)
Alkaline Phosphatase: 58 U/L (ref 38–126)
Anion gap: 9 (ref 5–15)
BUN: 18 mg/dL (ref 8–23)
CO2: 27 mmol/L (ref 22–32)
Calcium: 8.8 mg/dL — ABNORMAL LOW (ref 8.9–10.3)
Chloride: 103 mmol/L (ref 98–111)
Creatinine, Ser: 1.02 mg/dL (ref 0.61–1.24)
GFR calc Af Amer: >60 mL/min (ref >60)
GFR calc non Af Amer: >60 mL/min (ref >60)
Glucose, Bld: 91 mg/dL (ref 70–99)
Potassium: 4.6 mmol/L (ref 3.5–5.1)
Sodium: 139 mmol/L (ref 135–145)
Total Bilirubin: 0.6 mg/dL (ref 0.3–1.2)
Total Protein: 5.4 g/dL — ABNORMAL LOW (ref 6.5–8.1)

## 2019-11-28 LAB — CBC
HCT: 30.6 % — ABNORMAL LOW (ref 39.0–52.0)
Hemoglobin: 9.5 g/dL — ABNORMAL LOW (ref 13.0–17.0)
MCH: 29.6 pg (ref 26.0–34.0)
MCHC: 31 g/dL (ref 30.0–36.0)
MCV: 95.3 fL (ref 80.0–100.0)
Platelets: 273 10*3/uL (ref 150–400)
RBC: 3.21 MIL/uL — ABNORMAL LOW (ref 4.22–5.81)
RDW: 13.2 % (ref 11.5–15.5)
WBC: 9.2 10*3/uL (ref 4.0–10.5)
nRBC: 0 % (ref 0.0–0.2)

## 2019-11-28 LAB — CYTOLOGY - NON PAP

## 2019-11-28 LAB — SURGICAL PATHOLOGY

## 2019-11-28 MED ORDER — IRBESARTAN 150 MG PO TABS
75.0000 mg | ORAL_TABLET | Freq: Every day | ORAL | Status: DC
  Administered 2019-11-28 – 2019-11-30 (×3): 75 mg via ORAL
  Filled 2019-11-28 (×3): qty 1

## 2019-11-28 NOTE — Progress Notes (Addendum)
      [address]       [place] [address]             [phone]      2 Days Post-Op Procedure(s) (LRB): VIDEO BRONCHOSCOPY (N/A) VIDEO ASSISTED THORACOSCOPY (Left) DRAINAGE OF PLEURAL EFFUSION (Left) DECORTICATION (Left)   Subjective:  Patient has some pain at chest tube site.  He notices it has been increasing over the past several days.   He has not yet moved his bowels.  Objective: Vital signs in last 24 hours: Temp:  [97.6 F (36.4 C)-97.7 F (36.5 C)] 97.6 F (36.4 C) (12/11 0314) Pulse Rate:  [77-94] 80 (12/11 0314) Cardiac Rhythm: Normal sinus rhythm (12/11 0523) Resp:  [10-24] 13 (12/11 0314) BP: (73-114)/(30-56) 96/49 (12/11 0314) SpO2:  [87 %-96 %] 94 % (12/11 0314) Arterial Line BP: (94)/(55) 94/55 (12/10 1100)  Intake/Output from previous day: 12/10 0701 - 12/11 0700 In: 862 [P.O.:250; I.V.:580; IV Piggyback:32] Out: 960 [Urine:850; Chest Tube:110]  General appearance: alert, cooperative and no distress Heart: regular rate and rhythm Lungs: clear to auscultation bilaterally Abdomen: soft, non-tender; bowel sounds normal; no masses,  no organomegaly Extremities: extremities normal, atraumatic, no cyanosis or edema Wound: clean and dry  Lab Results: Recent Labs    11/27/19 0340 11/28/19 0540  WBC 8.3 9.2  HGB 10.2* 9.5*  HCT 31.7* 30.6*  PLT 303 273   BMET:  Recent Labs    11/27/19 0340 11/28/19 0540  NA 137 139  K 4.7 4.6  CL 101 103  CO2 28 27  GLUCOSE 133* 91  BUN 16 18  CREATININE 0.90 1.02  CALCIUM 8.3* 8.8*    PT/INR: No results for input(s): LABPROT, INR in the last 72 hours. ABG    Component Value Date/Time   PHART 7.455 (H) 11/26/2019 1132   HCO3 26.9 11/26/2019 1132   TCO2 28 11/26/2019 1132   O2SAT 90.0 11/26/2019 1132   CBG (last 3)  No results for input(s): GLUCAP in the last 72 hours.  Assessment/Plan: S/P Procedure(s) (LRB): VIDEO BRONCHOSCOPY (N/A) VIDEO ASSISTED THORACOSCOPY  (Left) DRAINAGE OF PLEURAL EFFUSION (Left) DECORTICATION (Left)  1. CV- NSR, Hypotensive overnight- will decrease Avapro dose and monitor 2. Pulm- chest tube in place, no air leak on water seal, 110 cc output yesterday, can hopefully remove 1 chest tube soon, CXR remains stable 3. Dyslipidemia on statin 4. Pathology, OR cultures remain pending 5. Lovenox for DVT prophylaxis 6. Dispo- patient stable, chest tubes moved to water seal today, output was 110 cc yesterday, pathology, cultures pending, continue current care   LOS: 2 days    Ellwood Handler, PA-C  11/28/2019 Patient seen and examined, agree with above Will place tubes to water seal, can probably remove both tomorrow  Remo Lipps C. Roxan Hockey, MD [facility] [phone]

## 2019-11-28 NOTE — Plan of Care (Signed)
  Problem: Education: Goal: Knowledge of disease or condition will improve Outcome: Progressing Goal: Knowledge of the prescribed therapeutic regimen will improve Outcome: Progressing   Problem: Activity: Goal: Risk for activity intolerance will decrease Outcome: Progressing   Problem: Cardiac: Goal: Will achieve and/or maintain hemodynamic stability Outcome: Progressing   Problem: Clinical Measurements: Goal: Postoperative complications will be avoided or minimized Outcome: Progressing   Problem: Respiratory: Goal: Respiratory status will improve Outcome: Progressing   

## 2019-11-28 NOTE — Progress Notes (Signed)
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  He was in his usual state of health until about a month ago when he developed left-sided chest pain and left shoulder pain.  Work-up revealed a left pleural effusion.  Thoracentesis revealed the fluid to be an exudate. Now s/p L VATS/decortication.    PT Comments    Patient progressing well towards PT goals. Tolerated ambulation with use of RW for support and Min A, noted to have some listing in both directions and difficulty with turns (unfamiliar with RW). Also performed serial sit to stands for LE strengthening. Encouraged performing five each hour. Pt's daughter present during session. VSS except BP dropped post activity.  Sitting BP pre activity 119/65 Sitting BP post activity 99/66. Pt reports no symptoms. Will continue to follow and progress as tolerated.   Follow Up Recommendations  No PT follow up     Equipment Recommendations  None recommended by PT    Recommendations for Other Services       Precautions / Restrictions Precautions Precautions: Fall Precaution Comments: fall risk due to multiple lines; chest tube to water seal, PCA Restrictions Weight Bearing Restrictions: No    Mobility  Bed Mobility Overal bed mobility: Needs Assistance Bed Mobility: Supine to Sit;Sit to Supine     Supine to sit: Supervision;HOB elevated Sit to supine: Supervision;HOB elevated   General bed mobility comments: increased time and assist for multiple lines; no physical assist needed.  Transfers Overall transfer level: Needs assistance Equipment used: Rolling walker (2 wheeled);None Transfers: Sit to/from Stand Sit to Stand: Min  guard         General transfer comment: Min guard for safety. Stood from EOB x6. Assist with line management.  Ambulation/Gait Ambulation/Gait assistance: Min assist Gait Distance (Feet): 60 Feet Assistive device: Rolling walker (2 wheeled) Gait Pattern/deviations: Step-through pattern;Decreased stride length;Trunk flexed Gait velocity: decreased   General Gait Details: Slow, mildly unsteady gait, listing to right/left esp with turns, min A for balance. Unfamiliar with RW and lots of lines.   Stairs             Wheelchair Mobility    Modified Rankin (Stroke Patients Only)       Balance Overall balance assessment: Needs assistance Sitting-balance support: Feet supported;No upper extremity supported Sitting balance-Leahy Scale: Good     Standing balance support: During functional activity Standing balance-Leahy Scale: Fair Standing balance comment: standing beside bed with S no UE support, minguard-Min A for mobilizing                            Cognition Arousal/Alertness: Awake/alert Behavior During Therapy: WFL for tasks assessed/performed Overall Cognitive Status: Within Functional Limits for tasks assessed                                 General Comments: very HOH but seems [facility] for basic mobility tasks.      Exercises Other Exercises Other Exercises: Sit to stand x5 focusing on slow descent and eccentric control    General Comments General comments (skin integrity, edema, etc.): Bp pre activity 119/65, post activity BP 99/66, asymptomatic. Sp02 >92% on  RA.      Pertinent Vitals/Pain Pain Assessment: Faces Faces Pain Scale: No hurt    Home Living                      Prior Function            PT Goals (current goals can now be found in the care plan section) Progress towards PT goals: Progressing toward goals    Frequency    Min 3X/week      PT Plan Current plan remains appropriate     Co-evaluation              AM-PAC PT "6 Clicks" Mobility   Outcome Measure  Help needed turning from your back to your side while in a flat bed without using bedrails?: None Help needed moving from lying on your back to sitting on the side of a flat bed without using bedrails?: None Help needed moving to and from a bed to a chair (including a wheelchair)?: A Little Help needed standing up from a chair using your arms (e.g., wheelchair or bedside chair)?: A Little Help needed to walk in hospital room?: A Little Help needed climbing 3-5 steps with a railing? : A Lot 6 Click Score: 19    End of Session Equipment Utilized During Treatment: Gait belt Activity Tolerance: Patient tolerated treatment well Patient left: in bed;with call bell/phone within reach;with family/visitor present Nurse Communication: Mobility status PT Visit Diagnosis: Muscle weakness (generalized) (M62.81);Difficulty in walking, not elsewhere classified (R26.2)     Time: [id number] PT Time Calculation (min) (ACUTE ONLY): 22 min  Charges:  $Therapeutic Activity: 8-22 mins                     Marisa Severin, PT, DPT Acute Rehabilitation Services Pager [phone] Office [phone]       Marguarite Arbour A Sabra Heck 11/28/2019, 3:35 PM

## 2019-11-29 ENCOUNTER — Inpatient Hospital Stay (HOSPITAL_COMMUNITY): Payer: Medicare Other

## 2019-11-29 LAB — CULTURE, RESPIRATORY W GRAM STAIN: Culture: NORMAL

## 2019-11-29 LAB — BODY FLUID CULTURE: Culture: NO GROWTH

## 2019-11-29 MED ORDER — IPRATROPIUM-ALBUTEROL 20-100 MCG/ACT IN AERS
1.0000 | INHALATION_SPRAY | Freq: Four times a day (QID) | RESPIRATORY_TRACT | Status: DC | PRN
  Filled 2019-11-29: qty 4

## 2019-11-29 NOTE — Progress Notes (Signed)
Both chest tubes removed. Patient tolerated well. Vital signs within normal limits. Will continue to monitor patient.

## 2019-11-29 NOTE — Progress Notes (Addendum)
3 Days Post-Op Procedure(s) (LRB): VIDEO BRONCHOSCOPY (N/A) VIDEO ASSISTED THORACOSCOPY (Left) DRAINAGE OF PLEURAL EFFUSION (Left) DECORTICATION (Left) Subjective: Awake and alert, no new concerns. Pain control adequate but he is noticing more chest wall soreness than previously.   Objective: Vital [name] in last 24 hours: Temp:  [97.6 F (36.4 C)-98.6 F (37 C)] 98.6 F (37 C) (12/12 0731) Pulse Rate:  [82-100] 91 (12/12 0800) Cardiac Rhythm: (P) Normal sinus rhythm (12/12 0800) Resp:  [13-24] 18 (12/12 0800) BP: (105-130)/(48-62) 128/62 (12/12 0731) SpO2:  [90 %-96 %] 92 % (12/12 0800)    Intake/Output from previous day: 12/11 0701 - 12/12 0700 In: 1678.1 [I.V.:1678.1] Out: 820 [Urine:750; Chest Tube:70] Intake/Output this shift: No intake/output data recorded.  Physical Exam: General appearance: alert, cooperative and no distress Neurologic: intact Heart: SR Lungs: breath sounds clear, diminished left base. No air leak. CT drainage only 16ml past 24 hours. CXR is stable. Has chronic elevation of left diaphragm.  Wound: dressing dry.   Lab Results: Recent Labs    11/27/19 0340 11/28/19 0540  WBC 8.3 9.2  HGB 10.2* 9.5*  HCT 31.7* 30.6*  PLT 303 273   BMET:  Recent Labs    11/27/19 0340 11/28/19 0540  NA 137 139  K 4.7 4.6  CL 101 103  CO2 28 27  GLUCOSE 133* 91  BUN 16 18  CREATININE 0.90 1.02  CALCIUM 8.3* 8.8*    PT/INR: No results for input(s): LABPROT, INR in the last 72 hours. ABG    Component Value Date/Time   PHART 7.455 (H) 11/26/2019 1132   HCO3 26.9 11/26/2019 1132   TCO2 28 11/26/2019 1132   O2SAT 90.0 11/26/2019 1132   CBG (last 3)  No results for input(s): GLUCAP in the last 72 hours.  Assessment/Plan: S/P Procedure(s) (LRB): VIDEO BRONCHOSCOPY (N/A) VIDEO ASSISTED THORACOSCOPY (Left) DRAINAGE OF PLEURAL EFFUSION (Left) DECORTICATION (Left)  -POD3 left VATS / decortication for loculated pleural effusion. Chest tube drainage  only 76ml past 24 hours. No air leak. Will remove CT today.  D/C PCA.  Possible discharge in AM if CXR stable.  Cx pending. Pleural fluid cytology negative for malignancy. Pleural histology negative for malignancy.  -History of HTN- control adequate on metoprolol and irbesartan -Dyslipidemia- resume statin  -DVT PPX- start [place] enoxaparin today.     LOS: 3 days    Timothy [name] [id number] 11/29/2019  Plan removal of chest tubes today  I have seen and examined Timothy [name] and agree with the above assessment  and plan.  Grace Isaac MD Beeper [phone] Office [phone] 11/29/2019 11:02 AM

## 2019-11-30 ENCOUNTER — Inpatient Hospital Stay (HOSPITAL_COMMUNITY): Payer: Medicare Other

## 2019-11-30 MED ORDER — TRAMADOL HCL 50 MG PO TABS: 50.0000 mg | ORAL_TABLET | Freq: Four times a day (QID) | ORAL | 0 refills | Status: AC | PRN

## 2019-11-30 NOTE — Progress Notes (Addendum)
4 Days Post-Op Procedure(s) (LRB): VIDEO BRONCHOSCOPY (N/A) VIDEO ASSISTED THORACOSCOPY (Left) DRAINAGE OF PLEURAL EFFUSION (Left) DECORTICATION (Left) Subjective: Alert, mental status appropriate.  Says left chest soreness is improving. Walked in the room yesterday.  No new concerns.  Objective: Vital [name] in last 24 hours: Temp:  [98.3 F (36.8 C)-98.6 F (37 C)] 98.4 F (36.9 C) (12/13 0725) Pulse Rate:  [75-91] 88 (12/13 0725) Cardiac Rhythm: Normal sinus rhythm (12/13 0700) Resp:  [15-20] 17 (12/13 0725) BP: (118-147)/(48-88) 147/68 (12/13 0725) SpO2:  [91 %-95 %] 93 % (12/13 0725)    Intake/Output from previous day: 12/12 0701 - 12/13 0700 In: 647 [P.O.:240; I.V.:407] Out: 625 [Urine:625] Intake/Output this shift: No intake/output data recorded.  Physical Exam: General appearance:alert, cooperative and no distress Neurologic:intact Heart:SR Lungs:breath sounds clear, diminished left base. CXR shows stable left lower pleural thickening and density unchanged. No PTX.  Wound:the thoracotomy incision is well approximated and dry. The CT site is covered with a dry bulky dressing.   Lab Results: Recent Labs    11/28/19 0540  WBC 9.2  HGB 9.5*  HCT 30.6*  PLT 273   BMET:  Recent Labs    11/28/19 0540  NA 139  K 4.6  CL 103  CO2 27  GLUCOSE 91  BUN 18  CREATININE 1.02  CALCIUM 8.8*    PT/INR: No results for input(s): LABPROT, INR in the last 72 hours. ABG    Component Value Date/Time   PHART 7.455 (H) 11/26/2019 1132   HCO3 26.9 11/26/2019 1132   TCO2 28 11/26/2019 1132   O2SAT 90.0 11/26/2019 1132   CBG (last 3)  No results for input(s): GLUCAP in the last 72 hours.  Assessment/Plan: S/P Procedure(s) (LRB): VIDEO BRONCHOSCOPY (N/A) VIDEO ASSISTED THORACOSCOPY (Left) DRAINAGE OF PLEURAL EFFUSION (Left) DECORTICATION (Left)  -POD4 left VATS / decortication for loculated pleural effusion. Chest tube removed yesterday. CXR and respiratory  status stable.  O2 sats acceptable on RA.    Pleural fluid cytology negative for malignancy. Pleural histology negative for malignancy. AFB and fungal prep negative, OR cultures negative.  -History of HTN-control adequate on metoprolol and irbesartan  -Dyslipidemia- resumed statin  -Plan discharge later today.  -Remove central line.    LOS: 4 days    Timothy [name] [id number] 11/30/2019  Chest xray today stable  Patient wants to go home today  Reviewed path with patient  I have seen and examined Timothy [name] and agree with the above assessment  and plan.  Grace Isaac MD Beeper [phone] Office [phone] 11/30/2019 9:46 AM

## 2019-11-30 NOTE — Progress Notes (Signed)
Discharge instructions reviewed with patient/wife.They both verbalized understanding and all questions were answered.

## 2019-12-01 LAB — AEROBIC/ANAEROBIC CULTURE W GRAM STAIN (SURGICAL/DEEP WOUND): Culture: NO GROWTH

## 2019-12-17 ENCOUNTER — Other Ambulatory Visit: Payer: Self-pay | Admitting: Thoracic Surgery (Cardiothoracic Vascular Surgery)

## 2019-12-17 DIAGNOSIS — J90 Pleural effusion, not elsewhere classified: Secondary | ICD-10-CM

## 2019-12-23 ENCOUNTER — Ambulatory Visit (INDEPENDENT_AMBULATORY_CARE_PROVIDER_SITE_OTHER): Payer: Self-pay | Admitting: Thoracic Surgery (Cardiothoracic Vascular Surgery)

## 2019-12-23 ENCOUNTER — Other Ambulatory Visit: Payer: Self-pay

## 2019-12-23 ENCOUNTER — Ambulatory Visit
Admission: RE | Admit: 2019-12-23 | Discharge: 2019-12-23 | Disposition: A | Discharge status: Home / Self Care | Payer: Medicare Other | Source: Ambulatory Visit | Attending: Thoracic Surgery (Cardiothoracic Vascular Surgery) | Admitting: Thoracic Surgery (Cardiothoracic Vascular Surgery)

## 2019-12-23 ENCOUNTER — Encounter: Payer: Self-pay | Admitting: Thoracic Surgery (Cardiothoracic Vascular Surgery)

## 2019-12-23 VITALS — BP 114/63 | HR 86 | Temp 97.0°F | Resp 20 | Ht 73.0 in | Wt 215.0 lb

## 2019-12-23 DIAGNOSIS — J90 Pleural effusion, not elsewhere classified: Secondary | ICD-10-CM

## 2019-12-23 DIAGNOSIS — J869 Pyothorax without fistula: Secondary | ICD-10-CM

## 2019-12-23 NOTE — Progress Notes (Signed)
[address]       [place] [address]             [phone]     HPI: Timothy [name] returns for a scheduled follow-up visit  Timothy [name] is a 79 year old man with a history of tobacco abuse, coronary disease, hypertension, hyperlipidemia, atrial fibrillation, and arthritis.  He became ill last fall.  He developed left-sided pleuritic chest pain and left shoulder pain along with chills.  He did have a single fever.  A chest x-ray showed a pleural effusion.  He had a thoracentesis which drained 400 mL of fluid.  The fluid was an exudate.  Cytology was negative for malignancy.  He had some reaccumulation and a repeat thoracentesis was unsuccessful due to the fluid being highly loculated.  He was referred for surgical treatment.  I did a decortication on 11/26/2019.  Postoperatively he did well and went home on day 4.  He feels well.  His breathing is better than it was prior to surgery.  He is not having any pain.  He has not taken any narcotics since discharge.  Past Medical History:  Diagnosis Date  . Arthritis 09/08/2015  . CAD (coronary artery disease) 09/07/2015   S/P RCA stent x2, Hx MI, (+)ACEI, Statin, BB, ASA - 10/2012   . Essential hypertension 09/07/2015  . History of kidney stones 09/20/2015   11/2013 33mm stone  . Hyperlipidemia 09/07/2015  . Myocardial infarction ([place])    11/2011, s/p RCA stent  . Osteopenia 10/29/2015   FRAX Score: 10 year risk of major osteoporotic fracture 8.9%, hip fracture 3.2%, prescription treatment is indicated, called in VitD/Ca, +/- bisphosphanate   . PAF (paroxysmal atrial fibrillation) ([place])   . Screening for AAA (aortic abdominal aneurysm) 09/20/2015   Neg 06/11/15 per record review  . Seasonal allergies 09/07/2015    Current Outpatient Medications  Medication Sig Dispense Refill  . aspirin 81 MG tablet Take 1 tablet (81 mg total) by mouth daily. 90 tablet 3  . Calcium Carbonate-Vitamin D 600-400 MG-UNIT chew tablet Chew 2  tablets by mouth daily. 180 tablet 3  . metoprolol succinate (TOPROL XL) 25 MG 24 hr tablet Take 0.5 tablets (12.5 mg total) by mouth daily. 45 tablet 3  . montelukast (SINGULAIR) 10 MG tablet Take 1 tablet (10 mg total) by mouth daily. 90 tablet 3  . Multiple Vitamins-Minerals (AIRBORNE PO) Take 1 tablet by mouth at bedtime.     Marland Kitchen telmisartan (MICARDIS) 40 MG tablet Take 1 tablet (40 mg total) by mouth daily. (Patient taking differently: Take 20 mg by mouth daily. ) 30 tablet 11  . Turmeric 500 MG TABS Take 1,000 mg by mouth daily.     Marland Kitchen albuterol (VENTOLIN HFA) 108 (90 Base) MCG/ACT inhaler Inhale 2 puffs into the lungs every 6 (six) hours as needed for wheezing or shortness of breath. (Patient not taking: Reported on 12/23/2019) 18 g 0  . hydroxypropyl methylcellulose / hypromellose (ISOPTO TEARS / GONIOVISC) 2.5 % ophthalmic solution Place 1 drop into both eyes 3 (three) times daily as needed for dry eyes.     No current facility-administered medications for this visit.    Physical Exam  Diagnostic Tests: CHEST - 2 VIEW  COMPARISON:  11/30/2019  FINDINGS: Normal heart size. Unchanged small to moderate left pleural effusion with left lung base atelectasis and or scarring. Right lung is clear. No new findings.  IMPRESSION: No change in small to moderate left pleural  effusion and left lung base atelectasis and/or scarring.   Electronically Signed   By: Kerby Moors M.D.   On: 12/23/2019 12:17 I personally reviewed the chest x-ray images and concur with the findings noted above  Pathology FINAL MICROSCOPIC DIAGNOSIS:   A. PLEURAL PEEL, LEFT:  - Mixed inflammation.  - Benign lung tissue.  - No malignancy identified.   B. PLEURAL PEEL, LEFT #2:  - Mixed inflammation.  - No malignancy identified.   Impression: Timothy [name] is a 80 year old man with a history of tobacco abuse, coronary disease, hypertension, hyperlipidemia, atrial fibrillation, and arthritis.  He  presented couple of months ago with left-sided pleuritic chest pain and left shoulder pain.  Work-up revealed a pleural effusion.  I did a drainage of the effusion and decortication on 11/26/2019.  His postoperative course was unremarkable.  I discussed the final pathology results with Timothy [name].  They understand this was likely a postpneumonic effusion.  There was no evidence of malignancy.   There are no restrictions on his activities.  His wife says that he has been very inactive for a long time.  I strongly advised him to begin exercise on a regular basis but to start very small and then build up gradually.    Plan: I will be happy to see Timothy [name] back anytime in the future if I can be of any further assistance with his care  [name], MD [facility] [phone]

## 2019-12-24 LAB — ACID FAST CULTURE WITH REFLEXED SENSITIVITIES (MYCOBACTERIA): Acid Fast Culture: NEGATIVE

## 2019-12-25 LAB — FUNGUS CULTURE WITH STAIN

## 2019-12-25 LAB — FUNGUS CULTURE RESULT

## 2019-12-25 LAB — FUNGAL ORGANISM REFLEX

## 2020-01-09 LAB — ACID FAST CULTURE WITH REFLEXED SENSITIVITIES (MYCOBACTERIA)
Acid Fast Culture: NEGATIVE
Acid Fast Culture: NEGATIVE

## 2020-01-12 LAB — ACID FAST CULTURE WITH REFLEXED SENSITIVITIES (MYCOBACTERIA): Acid Fast Culture: NEGATIVE

## 2020-03-02 DIAGNOSIS — H524 Presbyopia: Secondary | ICD-10-CM | POA: Diagnosis not present

## 2020-03-02 DIAGNOSIS — H35033 Hypertensive retinopathy, bilateral: Secondary | ICD-10-CM | POA: Diagnosis not present

## 2020-03-02 DIAGNOSIS — H35363 Drusen (degenerative) of macula, bilateral: Secondary | ICD-10-CM | POA: Diagnosis not present

## 2020-03-02 DIAGNOSIS — H2513 Age-related nuclear cataract, bilateral: Secondary | ICD-10-CM | POA: Diagnosis not present

## 2020-03-31 ENCOUNTER — Other Ambulatory Visit: Payer: Self-pay | Admitting: Osteopathic Medicine

## 2020-03-31 DIAGNOSIS — J302 Other seasonal allergic rhinitis: Secondary | ICD-10-CM

## 2020-04-20 ENCOUNTER — Ambulatory Visit (INDEPENDENT_AMBULATORY_CARE_PROVIDER_SITE_OTHER): Payer: Medicare Other

## 2020-04-20 ENCOUNTER — Encounter: Payer: Self-pay | Admitting: Sports Medicine

## 2020-04-20 ENCOUNTER — Ambulatory Visit (INDEPENDENT_AMBULATORY_CARE_PROVIDER_SITE_OTHER): Payer: Medicare Other | Admitting: Sports Medicine

## 2020-04-20 ENCOUNTER — Other Ambulatory Visit: Payer: Self-pay

## 2020-04-20 DIAGNOSIS — M47816 Spondylosis without myelopathy or radiculopathy, lumbar region: Secondary | ICD-10-CM | POA: Diagnosis not present

## 2020-04-20 DIAGNOSIS — M545 Low back pain: Secondary | ICD-10-CM | POA: Diagnosis not present

## 2020-04-20 MED ORDER — MELOXICAM 15 MG PO TABS: ORAL_TABLET | ORAL | 3 refills | Status: DC

## 2020-04-20 MED ORDER — PREDNISONE 50 MG PO TABS: ORAL_TABLET | ORAL | 0 refills | Status: DC

## 2020-04-20 NOTE — Progress Notes (Signed)
    Procedures performed today:    None.  Independent interpretation of notes and tests performed by another provider:   None.  Brief History, Exam, Impression, and Recommendations:    Lumbar spondylosis This is a very pleasant 79 year old male, he is a chronic history of low back pain, axial. His pain is often worse with standing, better with flexion all consistent with lumbar spinal stenosis, nothing radicular, no overt neurogenic claudication in the lower legs but somewhat in the right buttock. We will start conservative, x-rays, 5 days of prednisone followed by meloxicam. Home exercises, he declines formal PT. Return to see me in 6 weeks, MRI for interventional planning if no better versus the addition of gabapentin.    ___________________________________________ Gwen Her. Dianah Field, M.D., ABFM., CAQSM. Primary Care and [place] of [place] [employer] [facility] [employer] [facility]

## 2020-04-20 NOTE — Assessment & Plan Note (Signed)
This is a very pleasant 79 year old male, he is a chronic history of low back pain, axial. His pain is often worse with standing, better with flexion all consistent with lumbar spinal stenosis, nothing radicular, no overt neurogenic claudication in the lower legs but somewhat in the right buttock. We will start conservative, x-rays, 5 days of prednisone followed by meloxicam. Home exercises, he declines formal PT. Return to see me in 6 weeks, MRI for interventional planning if no better versus the addition of gabapentin.

## 2020-06-15 ENCOUNTER — Other Ambulatory Visit: Payer: Self-pay | Admitting: Osteopathic Medicine

## 2020-06-15 DIAGNOSIS — J302 Other seasonal allergic rhinitis: Secondary | ICD-10-CM

## 2020-06-17 ENCOUNTER — Other Ambulatory Visit: Payer: Self-pay

## 2020-06-17 MED ORDER — TELMISARTAN 40 MG PO TABS: 40.0000 mg | ORAL_TABLET | Freq: Every day | ORAL | 2 refills | Status: DC

## 2020-10-01 ENCOUNTER — Emergency Department (INDEPENDENT_AMBULATORY_CARE_PROVIDER_SITE_OTHER): Payer: Medicare Other

## 2020-10-01 ENCOUNTER — Emergency Department (INDEPENDENT_AMBULATORY_CARE_PROVIDER_SITE_OTHER)
Admission: EM | Admit: 2020-10-01 | Discharge: 2020-10-01 | Disposition: A | Discharge status: Home / Self Care | Payer: Medicare Other | Source: Home / Self Care

## 2020-10-01 ENCOUNTER — Telehealth: Payer: Self-pay | Admitting: Neurology

## 2020-10-01 ENCOUNTER — Other Ambulatory Visit: Payer: Self-pay

## 2020-10-01 DIAGNOSIS — Z9189 Other specified personal risk factors, not elsewhere classified: Secondary | ICD-10-CM

## 2020-10-01 DIAGNOSIS — I252 Old myocardial infarction: Secondary | ICD-10-CM | POA: Diagnosis not present

## 2020-10-01 DIAGNOSIS — U071 COVID-19: Secondary | ICD-10-CM | POA: Diagnosis not present

## 2020-10-01 DIAGNOSIS — Z7982 Long term (current) use of aspirin: Secondary | ICD-10-CM | POA: Diagnosis not present

## 2020-10-01 DIAGNOSIS — J9811 Atelectasis: Secondary | ICD-10-CM | POA: Diagnosis not present

## 2020-10-01 DIAGNOSIS — Z9852 Vasectomy status: Secondary | ICD-10-CM | POA: Diagnosis not present

## 2020-10-01 DIAGNOSIS — R059 Cough, unspecified: Secondary | ICD-10-CM | POA: Diagnosis not present

## 2020-10-01 DIAGNOSIS — R0602 Shortness of breath: Secondary | ICD-10-CM | POA: Diagnosis not present

## 2020-10-01 DIAGNOSIS — N179 Acute kidney failure, unspecified: Secondary | ICD-10-CM | POA: Diagnosis not present

## 2020-10-01 DIAGNOSIS — Z955 Presence of coronary angioplasty implant and graft: Secondary | ICD-10-CM | POA: Diagnosis not present

## 2020-10-01 DIAGNOSIS — J90 Pleural effusion, not elsewhere classified: Secondary | ICD-10-CM | POA: Diagnosis not present

## 2020-10-01 DIAGNOSIS — I509 Heart failure, unspecified: Secondary | ICD-10-CM | POA: Diagnosis not present

## 2020-10-01 DIAGNOSIS — R778 Other specified abnormalities of plasma proteins: Secondary | ICD-10-CM | POA: Diagnosis not present

## 2020-10-01 DIAGNOSIS — R0902 Hypoxemia: Secondary | ICD-10-CM

## 2020-10-01 DIAGNOSIS — J1282 Pneumonia due to coronavirus disease 2019: Secondary | ICD-10-CM | POA: Diagnosis not present

## 2020-10-01 DIAGNOSIS — Z8709 Personal history of other diseases of the respiratory system: Secondary | ICD-10-CM | POA: Diagnosis not present

## 2020-10-01 DIAGNOSIS — R7989 Other specified abnormal findings of blood chemistry: Secondary | ICD-10-CM | POA: Diagnosis not present

## 2020-10-01 DIAGNOSIS — J439 Emphysema, unspecified: Secondary | ICD-10-CM | POA: Diagnosis not present

## 2020-10-01 DIAGNOSIS — J9601 Acute respiratory failure with hypoxia: Secondary | ICD-10-CM | POA: Diagnosis not present

## 2020-10-01 DIAGNOSIS — I2510 Atherosclerotic heart disease of native coronary artery without angina pectoris: Secondary | ICD-10-CM | POA: Diagnosis not present

## 2020-10-01 DIAGNOSIS — I10 Essential (primary) hypertension: Secondary | ICD-10-CM | POA: Diagnosis not present

## 2020-10-01 DIAGNOSIS — R509 Fever, unspecified: Secondary | ICD-10-CM

## 2020-10-01 DIAGNOSIS — Z87891 Personal history of nicotine dependence: Secondary | ICD-10-CM | POA: Diagnosis not present

## 2020-10-01 DIAGNOSIS — I493 Ventricular premature depolarization: Secondary | ICD-10-CM | POA: Diagnosis not present

## 2020-10-01 DIAGNOSIS — I480 Paroxysmal atrial fibrillation: Secondary | ICD-10-CM | POA: Diagnosis not present

## 2020-10-01 DIAGNOSIS — Z79899 Other long term (current) drug therapy: Secondary | ICD-10-CM | POA: Diagnosis not present

## 2020-10-01 LAB — POCT INFLUENZA A/B
Influenza A, POC: NEGATIVE
Influenza B, POC: NEGATIVE

## 2020-10-01 LAB — POC SARS CORONAVIRUS 2 AG -  ED: SARS Coronavirus 2 Ag: POSITIVE — AB

## 2020-10-01 MED ORDER — ACETAMINOPHEN 325 MG PO TABS
975.0000 mg | ORAL_TABLET | Freq: Once | ORAL | Status: AC
  Administered 2020-10-01: 975 mg via ORAL

## 2020-10-01 NOTE — ED Notes (Signed)
Patient is being discharged from the Urgent Care and sent to the Emergency Department via POV driven by wife . Per Lavell Anchors FNP, patient is in need of higher level of care due to covid dx and low 02. Patient is aware and verbalizes understanding of plan of care.  Vitals:   10/01/20 0913  BP: 126/64  Pulse: 79  Resp: 20  Temp: (!) 101.4 F (38.6 C)  SpO2: (!) 89%

## 2020-10-01 NOTE — Telephone Encounter (Signed)
Patient's wife left vm concerned about Covid symptoms. Reviewed chart and he is being seen by urgent care now. FYI.

## 2020-10-01 NOTE — ED Triage Notes (Signed)
Pt presents to Urgent Care with c/o generalized weakness, body aches, cough, and intermittent nausea x several days.  Pt's wife w/ known COVID exposure and symptoms. Pt has not been vaccinated against COVID.

## 2020-10-01 NOTE — Discharge Instructions (Signed)
You are Covid positive given you have chronic coronary disease which is heart disease and high cholesterol and a history of atrial fibrillation all 3 of these predispose you to complications associated with Covid.  Your oxygen level is also low today and has required oxygen to maintain your saturations above 90.  Given all these factors highly recommend that you were evaluated in the setting of the emergency department as you are high risk for adverse side effects related to Covid.

## 2020-10-01 NOTE — ED Provider Notes (Signed)
[name]    CSN: [id number] Arrival date & time: 10/01/20  0843      History   Chief Complaint Chief Complaint  Patient presents with  . Shortness of Breath    generalized weakness, cough    HPI Timothy [name] is a 79 y.o. male.   HPI  Medical history significant for coronary artery disease, hypertension, hyperlipidemia, hard of hearing. Patient presents today accompanied by wife who is recently exposed to multiple people who have since tested positive for COVID-19.  She is providing history for husband and reports he has been fatigued, nauseous, short of breath, and febrile over the course of the last 2 to 3 days.  She endorses that occasionally he has been forgetful and has decreased eating and oral intake of fluids.  Highest fever at home has been 100.3, he is 101.9 on arrival here today.  His oxygen saturation at room air has wavered between 88 and 89%.  Patient is not on home oxygen. Past Medical History:  Diagnosis Date  . Arthritis 09/08/2015  . CAD (coronary artery disease) 09/07/2015   S/P RCA stent x2, Hx MI, (+)ACEI, Statin, BB, ASA - 10/2012   . Essential hypertension 09/07/2015  . History of kidney stones 09/20/2015   11/2013 76mm stone  . Hyperlipidemia 09/07/2015  . Myocardial infarction ([place])    11/2011, s/p RCA stent  . Osteopenia 10/29/2015   FRAX Score: 10 year risk of major osteoporotic fracture 8.9%, hip fracture 3.2%, prescription treatment is indicated, called in VitD/Ca, +/- bisphosphanate   . PAF (paroxysmal atrial fibrillation) ([place])   . Screening for AAA (aortic abdominal aneurysm) 09/20/2015   Neg 06/11/15 per record review  . Seasonal allergies 09/07/2015    Patient Active Problem List   Diagnosis Date Noted  . Lumbar spondylosis 04/20/2020  . S/P thoracotomy 11/26/2019  . Pleural effusion on left 11/04/2019  . Rib pain on left side 10/20/2019  . Fever 10/20/2019  . Cough 10/20/2019  . Former smoker 10/20/2019  . Atrial fibrillation  ([place]) 04/16/2019  . Senile purpura ([place]) 04/16/2019  . Encounter for physical examination of prospective foster parent 02/06/2019  . Coronary atherosclerosis 01/31/2017  . Osteopenia 10/29/2015  . History of kidney stones 09/20/2015  . Screening for AAA (aortic abdominal aneurysm) 09/20/2015  . Arthritis 09/08/2015  . Essential hypertension 09/07/2015  . Seasonal allergies 09/07/2015  . Hyperlipidemia 09/07/2015  . CAD (coronary artery disease) 09/07/2015    Past Surgical History:  Procedure Laterality Date  . CARDIAC SURGERY Bilateral 2013   2 stints 1- left side  & 1 right side  . CORONARY ANGIOPLASTY     RCA stent 11/2011; DES LAD 01/2012 [facility])  . DECORTICATION Left 11/26/2019   Procedure: DECORTICATION;  Surgeon: [name], MD;  Location: [facility];  Service: Thoracic;  Laterality: Left;  . PLEURAL EFFUSION DRAINAGE Left 11/26/2019   Procedure: DRAINAGE OF PLEURAL EFFUSION;  Surgeon: [name], MD;  Location: [facility];  Service: Thoracic;  Laterality: Left;  Marland Kitchen VIDEO ASSISTED THORACOSCOPY Left 11/26/2019   Procedure: VIDEO ASSISTED THORACOSCOPY;  Surgeon: [name], MD;  Location: [facility];  Service: Thoracic;  Laterality: Left;  Marland Kitchen VIDEO BRONCHOSCOPY N/A 11/26/2019   Procedure: VIDEO BRONCHOSCOPY;  Surgeon: [name], MD;  Location: [facility] OR;  Service: Thoracic;  Laterality: N/A;       Home Medications    Prior to Admission medications   Medication Sig Start Date End Date Taking? Authorizing Provider  aspirin 81 MG tablet Take 1 tablet (81 mg total) by mouth daily. 02/06/19   Emeterio Reeve, DO  Calcium Carbonate-Vitamin D 600-400 MG-UNIT chew tablet Chew 2 tablets by mouth daily. 02/06/19   Emeterio Reeve, DO  meloxicam (MOBIC) 15 MG tablet One tab PO qAM with a meal for 2 weeks, then daily prn pain. 04/20/20   Silverio Decamp, MD  metoprolol succinate (TOPROL-XL) 25 MG 24 hr tablet TAKE ONE-HALF (1/2) TABLET DAILY  06/15/20   Emeterio Reeve, DO  montelukast (SINGULAIR) 10 MG tablet TAKE 1 TABLET DAILY 06/15/20   Emeterio Reeve, DO  Multiple Vitamins-Minerals (AIRBORNE PO) Take 1 tablet by mouth at bedtime.     [provider]  predniSONE (DELTASONE) 50 MG tablet One tab PO daily for 5 days. 04/20/20   Silverio Decamp, MD  telmisartan (MICARDIS) 40 MG tablet Take 1 tablet (40 mg total) by mouth daily. 06/17/20   Tanda Rockers, MD  Turmeric 500 MG TABS Take 1,000 mg by mouth daily.     [provider]    Family History No family history on file.  Social History Social History   Tobacco Use  . Smoking status: Former Smoker    Packs/day: 1.00    Years: 50.00    Pack years: 50.00    Types: Cigarettes    Start date: 11/16/2012  . Smokeless tobacco: Never Used  . Tobacco comment: Encouraged to remain smoke free  Substance Use Topics  . Alcohol use: No    Alcohol/week: 0.0 standard drinks  . Drug use: No     Allergies   Patient has no known allergies.   Review of Systems Review of Systems Pertinent negatives listed in HPI Physical Exam Triage Vital Signs ED Triage Vitals  Enc Vitals Group     BP 10/01/20 0913 126/64     Pulse Rate 10/01/20 0913 79     Resp 10/01/20 0913 20     Temp 10/01/20 0913 (!) 101.4 F (38.6 C)     Temp Source 10/01/20 0913 Oral     SpO2 10/01/20 0913 (!) 89 %     Weight --      Height --      Head Circumference --      Peak Flow --      Pain Score 10/01/20 0917 0     Pain Loc --      Pain Edu? --      Excl. in [place]? --    No data found.  Updated Vital Signs BP 126/64 (BP Location: Left Arm)   Pulse 79   Temp (!) 101.4 F (38.6 C) (Oral)   Resp 20   SpO2 (!) 89%   Visual Acuity Right Eye Distance:   Left Eye Distance:   Bilateral Distance:    Right Eye Near:   Left Eye Near:    Bilateral Near:     Physical Exam Constitutional:      Appearance: He is ill-appearing.  HENT:     Head: Normocephalic.      Comments: Patient is hard of hearing. Hearing aid affixed in right ear Pulmonary:     Effort: Tachypnea present.     Breath sounds: Decreased breath sounds and rhonchi present.     Comments: Increased work of breathing noted 2 L of oxygen applied the patient oxygen saturations improved from 88% to 91% Skin:    General: Skin is warm.     Coloration: Skin is pale.  Neurological:  Comments: Generalized weakness  Psychiatric:     Comments: Very flat affect, insisting to go home, cooperative with re- direction      UC Treatments / Results  Labs (all labs ordered are listed, but only abnormal results are displayed) Labs Reviewed - No data to display  EKG   Radiology DG Chest 2 View  Result Date: 10/01/2020 CLINICAL DATA:  Shortness of breath.  COVID positive. EXAM: CHEST - 2 VIEW COMPARISON:  Chest x-ray dated December 23, 2019. FINDINGS: The heart size and mediastinal contours are within normal limits. Normal pulmonary vascularity. Chronic small left pleural effusion, unchanged. Mild left basilar atelectasis/scarring. No consolidation or pneumothorax. No acute osseous abnormality. IMPRESSION: 1.  No active cardiopulmonary disease. 2. Chronic small left pleural effusion. Electronically Signed   By: Titus Dubin M.D.   On: 10/01/2020 10:14    Procedures Procedures (including critical care time)  Medications Ordered in UC Medications - No data to display  Initial Impression / Assessment and Plan / UC Course  I have reviewed the triage vital signs and the nursing notes.  Pertinent labs & imaging results that were available during my care of the patient were reviewed by me and considered in my medical decision making (see chart for details).     Patient positive for Covid 19 here with a rapid test.  Patient is symptomatic with oxygen saturation raise consistent with acute hypoxia.  Patient had mild improvement with 2 L of oxygen and oxygen level did improve to 91%.  Chest x-ray  continues to show chronic left pleural effusion.  Discussed with spouse who is present at visit today that patient warrants a higher level of care given the complex nature of his chronic conditions including that of cardiovascular disease, hyperlipidemia and he is hypoxic while here in clinic patient should be transferred to the ER for further observation and evaluation.  Patient agreed to travel with wife to the nearest ER which is [facility].  Wife agreed to transport patient.  Staff assisted patient to the car.  Patient was stable for transport by family member to the ER.   Final Clinical Impressions(s) / UC Diagnoses   Final diagnoses:  At increased risk of exposure to COVID-19 virus  COVID-19 virus infection  Hypoxia     Discharge Instructions     You are Covid positive given you have chronic coronary disease which is heart disease and high cholesterol and a history of atrial fibrillation all 3 of these predispose you to complications associated with Covid.  Your oxygen level is also low today and has required oxygen to maintain your saturations above 90.  Given all these factors highly recommend that you were evaluated in the setting of the emergency department as you are high risk for adverse side effects related to Covid.    ED Prescriptions    None     PDMP not reviewed this encounter.   Scot Jun, FNP 10/01/20 1743

## 2020-10-02 DIAGNOSIS — I2510 Atherosclerotic heart disease of native coronary artery without angina pectoris: Secondary | ICD-10-CM | POA: Diagnosis not present

## 2020-10-02 DIAGNOSIS — I480 Paroxysmal atrial fibrillation: Secondary | ICD-10-CM | POA: Diagnosis not present

## 2020-10-02 DIAGNOSIS — N179 Acute kidney failure, unspecified: Secondary | ICD-10-CM | POA: Diagnosis not present

## 2020-10-02 DIAGNOSIS — U071 COVID-19: Secondary | ICD-10-CM | POA: Diagnosis not present

## 2020-10-02 DIAGNOSIS — J9601 Acute respiratory failure with hypoxia: Secondary | ICD-10-CM | POA: Diagnosis not present

## 2020-10-02 DIAGNOSIS — J1282 Pneumonia due to coronavirus disease 2019: Secondary | ICD-10-CM | POA: Diagnosis not present

## 2020-10-02 DIAGNOSIS — I10 Essential (primary) hypertension: Secondary | ICD-10-CM | POA: Diagnosis not present

## 2020-10-02 DIAGNOSIS — I5189 Other ill-defined heart diseases: Secondary | ICD-10-CM | POA: Diagnosis not present

## 2020-10-02 DIAGNOSIS — R778 Other specified abnormalities of plasma proteins: Secondary | ICD-10-CM | POA: Diagnosis not present

## 2020-10-02 LAB — SARS-COV-2 RNA,(COVID-19) QUALITATIVE NAAT: SARS CoV2 RNA: DETECTED — CR

## 2020-10-09 DIAGNOSIS — D6869 Other thrombophilia: Secondary | ICD-10-CM | POA: Diagnosis not present

## 2020-10-09 DIAGNOSIS — I129 Hypertensive chronic kidney disease with stage 1 through stage 4 chronic kidney disease, or unspecified chronic kidney disease: Secondary | ICD-10-CM | POA: Diagnosis not present

## 2020-10-09 DIAGNOSIS — U071 COVID-19: Secondary | ICD-10-CM | POA: Diagnosis not present

## 2020-10-09 DIAGNOSIS — N179 Acute kidney failure, unspecified: Secondary | ICD-10-CM | POA: Diagnosis not present

## 2020-10-09 DIAGNOSIS — J1282 Pneumonia due to coronavirus disease 2019: Secondary | ICD-10-CM | POA: Diagnosis not present

## 2020-10-09 DIAGNOSIS — J9601 Acute respiratory failure with hypoxia: Secondary | ICD-10-CM | POA: Diagnosis not present

## 2020-10-09 DIAGNOSIS — J984 Other disorders of lung: Secondary | ICD-10-CM | POA: Diagnosis not present

## 2020-10-09 DIAGNOSIS — I5023 Acute on chronic systolic (congestive) heart failure: Secondary | ICD-10-CM | POA: Diagnosis not present

## 2020-10-09 DIAGNOSIS — J9600 Acute respiratory failure, unspecified whether with hypoxia or hypercapnia: Secondary | ICD-10-CM | POA: Diagnosis not present

## 2020-10-09 DIAGNOSIS — R0602 Shortness of breath: Secondary | ICD-10-CM | POA: Diagnosis not present

## 2020-10-10 DIAGNOSIS — I10 Essential (primary) hypertension: Secondary | ICD-10-CM | POA: Diagnosis not present

## 2020-10-10 DIAGNOSIS — U071 COVID-19: Secondary | ICD-10-CM | POA: Diagnosis not present

## 2020-10-10 DIAGNOSIS — E875 Hyperkalemia: Secondary | ICD-10-CM | POA: Diagnosis not present

## 2020-10-10 DIAGNOSIS — J9600 Acute respiratory failure, unspecified whether with hypoxia or hypercapnia: Secondary | ICD-10-CM | POA: Diagnosis not present

## 2020-10-10 DIAGNOSIS — N1832 Chronic kidney disease, stage 3b: Secondary | ICD-10-CM | POA: Diagnosis not present

## 2020-10-10 DIAGNOSIS — N179 Acute kidney failure, unspecified: Secondary | ICD-10-CM | POA: Diagnosis not present

## 2020-10-11 DIAGNOSIS — U071 COVID-19: Secondary | ICD-10-CM | POA: Diagnosis not present

## 2020-10-11 DIAGNOSIS — N1832 Chronic kidney disease, stage 3b: Secondary | ICD-10-CM | POA: Diagnosis not present

## 2020-10-11 DIAGNOSIS — J9601 Acute respiratory failure with hypoxia: Secondary | ICD-10-CM | POA: Diagnosis not present

## 2020-10-11 DIAGNOSIS — E875 Hyperkalemia: Secondary | ICD-10-CM | POA: Diagnosis not present

## 2020-10-11 DIAGNOSIS — I129 Hypertensive chronic kidney disease with stage 1 through stage 4 chronic kidney disease, or unspecified chronic kidney disease: Secondary | ICD-10-CM | POA: Diagnosis present

## 2020-10-11 DIAGNOSIS — J1282 Pneumonia due to coronavirus disease 2019: Secondary | ICD-10-CM | POA: Diagnosis not present

## 2020-10-11 DIAGNOSIS — R509 Fever, unspecified: Secondary | ICD-10-CM | POA: Diagnosis not present

## 2020-10-11 DIAGNOSIS — I252 Old myocardial infarction: Secondary | ICD-10-CM | POA: Diagnosis not present

## 2020-10-11 DIAGNOSIS — I480 Paroxysmal atrial fibrillation: Secondary | ICD-10-CM | POA: Diagnosis not present

## 2020-10-11 DIAGNOSIS — N179 Acute kidney failure, unspecified: Secondary | ICD-10-CM | POA: Diagnosis not present

## 2020-10-11 DIAGNOSIS — R0602 Shortness of breath: Secondary | ICD-10-CM | POA: Diagnosis not present

## 2020-10-11 DIAGNOSIS — J9600 Acute respiratory failure, unspecified whether with hypoxia or hypercapnia: Secondary | ICD-10-CM | POA: Diagnosis not present

## 2020-10-11 DIAGNOSIS — R918 Other nonspecific abnormal finding of lung field: Secondary | ICD-10-CM | POA: Diagnosis not present

## 2020-10-11 DIAGNOSIS — I5023 Acute on chronic systolic (congestive) heart failure: Secondary | ICD-10-CM | POA: Diagnosis not present

## 2020-10-11 DIAGNOSIS — D6869 Other thrombophilia: Secondary | ICD-10-CM | POA: Diagnosis not present

## 2020-10-11 DIAGNOSIS — I4891 Unspecified atrial fibrillation: Secondary | ICD-10-CM | POA: Diagnosis not present

## 2020-10-11 DIAGNOSIS — J984 Other disorders of lung: Secondary | ICD-10-CM | POA: Diagnosis not present

## 2020-10-11 DIAGNOSIS — I2510 Atherosclerotic heart disease of native coronary artery without angina pectoris: Secondary | ICD-10-CM | POA: Diagnosis not present

## 2020-10-11 DIAGNOSIS — J449 Chronic obstructive pulmonary disease, unspecified: Secondary | ICD-10-CM | POA: Diagnosis present

## 2020-10-12 DIAGNOSIS — R0602 Shortness of breath: Secondary | ICD-10-CM | POA: Diagnosis not present

## 2020-10-12 DIAGNOSIS — I2510 Atherosclerotic heart disease of native coronary artery without angina pectoris: Secondary | ICD-10-CM | POA: Diagnosis not present

## 2020-10-12 DIAGNOSIS — J9601 Acute respiratory failure with hypoxia: Secondary | ICD-10-CM | POA: Diagnosis not present

## 2020-10-12 DIAGNOSIS — N1832 Chronic kidney disease, stage 3b: Secondary | ICD-10-CM | POA: Diagnosis not present

## 2020-10-12 DIAGNOSIS — I480 Paroxysmal atrial fibrillation: Secondary | ICD-10-CM | POA: Diagnosis not present

## 2020-10-12 DIAGNOSIS — J9600 Acute respiratory failure, unspecified whether with hypoxia or hypercapnia: Secondary | ICD-10-CM | POA: Diagnosis not present

## 2020-10-12 DIAGNOSIS — R918 Other nonspecific abnormal finding of lung field: Secondary | ICD-10-CM | POA: Diagnosis not present

## 2020-10-12 DIAGNOSIS — U071 COVID-19: Secondary | ICD-10-CM | POA: Diagnosis not present

## 2020-10-12 DIAGNOSIS — J1282 Pneumonia due to coronavirus disease 2019: Secondary | ICD-10-CM | POA: Diagnosis not present

## 2020-10-13 DIAGNOSIS — U071 COVID-19: Secondary | ICD-10-CM | POA: Diagnosis not present

## 2020-10-13 DIAGNOSIS — J9601 Acute respiratory failure with hypoxia: Secondary | ICD-10-CM | POA: Diagnosis not present

## 2020-10-13 DIAGNOSIS — J1282 Pneumonia due to coronavirus disease 2019: Secondary | ICD-10-CM | POA: Diagnosis not present

## 2020-10-13 DIAGNOSIS — I480 Paroxysmal atrial fibrillation: Secondary | ICD-10-CM | POA: Diagnosis not present

## 2020-10-13 DIAGNOSIS — J9600 Acute respiratory failure, unspecified whether with hypoxia or hypercapnia: Secondary | ICD-10-CM | POA: Diagnosis not present

## 2020-10-13 DIAGNOSIS — N1832 Chronic kidney disease, stage 3b: Secondary | ICD-10-CM | POA: Diagnosis not present

## 2020-10-13 DIAGNOSIS — I2510 Atherosclerotic heart disease of native coronary artery without angina pectoris: Secondary | ICD-10-CM | POA: Diagnosis not present

## 2020-10-14 DIAGNOSIS — I2510 Atherosclerotic heart disease of native coronary artery without angina pectoris: Secondary | ICD-10-CM | POA: Diagnosis not present

## 2020-10-14 DIAGNOSIS — J9601 Acute respiratory failure with hypoxia: Secondary | ICD-10-CM | POA: Diagnosis not present

## 2020-10-14 DIAGNOSIS — I480 Paroxysmal atrial fibrillation: Secondary | ICD-10-CM | POA: Diagnosis not present

## 2020-10-14 DIAGNOSIS — J9600 Acute respiratory failure, unspecified whether with hypoxia or hypercapnia: Secondary | ICD-10-CM | POA: Diagnosis not present

## 2020-10-14 DIAGNOSIS — N1832 Chronic kidney disease, stage 3b: Secondary | ICD-10-CM | POA: Diagnosis not present

## 2020-10-14 DIAGNOSIS — J1282 Pneumonia due to coronavirus disease 2019: Secondary | ICD-10-CM | POA: Diagnosis not present

## 2020-10-14 DIAGNOSIS — U071 COVID-19: Secondary | ICD-10-CM | POA: Diagnosis not present

## 2020-10-15 ENCOUNTER — Telehealth: Payer: Self-pay

## 2020-10-15 DIAGNOSIS — J1282 Pneumonia due to coronavirus disease 2019: Secondary | ICD-10-CM | POA: Diagnosis not present

## 2020-10-15 DIAGNOSIS — U071 COVID-19: Secondary | ICD-10-CM | POA: Diagnosis not present

## 2020-10-15 DIAGNOSIS — N1832 Chronic kidney disease, stage 3b: Secondary | ICD-10-CM | POA: Diagnosis not present

## 2020-10-15 DIAGNOSIS — J9600 Acute respiratory failure, unspecified whether with hypoxia or hypercapnia: Secondary | ICD-10-CM | POA: Diagnosis not present

## 2020-10-15 DIAGNOSIS — I480 Paroxysmal atrial fibrillation: Secondary | ICD-10-CM | POA: Diagnosis not present

## 2020-10-15 DIAGNOSIS — I2510 Atherosclerotic heart disease of native coronary artery without angina pectoris: Secondary | ICD-10-CM | POA: Diagnosis not present

## 2020-10-15 DIAGNOSIS — J9601 Acute respiratory failure with hypoxia: Secondary | ICD-10-CM | POA: Diagnosis not present

## 2020-10-15 NOTE — Telephone Encounter (Signed)
Patient's wife called to let Dr Sheppard Coil know that patient has been in [facility] in [place] for about a week (for the second time) and she states he is not expected to be able to come home.   Notes are available via Care Everywhere. She just wanted to give PCP an update on his status.

## 2020-10-16 DIAGNOSIS — I480 Paroxysmal atrial fibrillation: Secondary | ICD-10-CM | POA: Diagnosis not present

## 2020-10-16 DIAGNOSIS — N1832 Chronic kidney disease, stage 3b: Secondary | ICD-10-CM | POA: Diagnosis not present

## 2020-10-16 DIAGNOSIS — U071 COVID-19: Secondary | ICD-10-CM | POA: Diagnosis not present

## 2020-10-16 DIAGNOSIS — J1282 Pneumonia due to coronavirus disease 2019: Secondary | ICD-10-CM | POA: Diagnosis not present

## 2020-10-16 DIAGNOSIS — J9601 Acute respiratory failure with hypoxia: Secondary | ICD-10-CM | POA: Diagnosis not present

## 2020-10-16 DIAGNOSIS — I2510 Atherosclerotic heart disease of native coronary artery without angina pectoris: Secondary | ICD-10-CM | POA: Diagnosis not present

## 2020-10-16 DIAGNOSIS — J9600 Acute respiratory failure, unspecified whether with hypoxia or hypercapnia: Secondary | ICD-10-CM | POA: Diagnosis not present

## 2020-10-17 DIAGNOSIS — I480 Paroxysmal atrial fibrillation: Secondary | ICD-10-CM | POA: Diagnosis not present

## 2020-10-17 DIAGNOSIS — J9600 Acute respiratory failure, unspecified whether with hypoxia or hypercapnia: Secondary | ICD-10-CM | POA: Diagnosis not present

## 2020-10-17 DIAGNOSIS — J1282 Pneumonia due to coronavirus disease 2019: Secondary | ICD-10-CM | POA: Diagnosis not present

## 2020-10-17 DIAGNOSIS — N1832 Chronic kidney disease, stage 3b: Secondary | ICD-10-CM | POA: Diagnosis not present

## 2020-10-17 DIAGNOSIS — I2510 Atherosclerotic heart disease of native coronary artery without angina pectoris: Secondary | ICD-10-CM | POA: Diagnosis not present

## 2020-10-17 DIAGNOSIS — U071 COVID-19: Secondary | ICD-10-CM | POA: Diagnosis not present

## 2020-10-17 DIAGNOSIS — J9601 Acute respiratory failure with hypoxia: Secondary | ICD-10-CM | POA: Diagnosis not present

## 2020-10-18 DIAGNOSIS — U071 COVID-19: Secondary | ICD-10-CM | POA: Diagnosis not present

## 2020-10-18 DIAGNOSIS — J984 Other disorders of lung: Secondary | ICD-10-CM | POA: Diagnosis not present

## 2020-10-18 DIAGNOSIS — J9601 Acute respiratory failure with hypoxia: Secondary | ICD-10-CM | POA: Diagnosis not present

## 2020-10-18 DIAGNOSIS — I4891 Unspecified atrial fibrillation: Secondary | ICD-10-CM | POA: Diagnosis not present

## 2020-10-18 DIAGNOSIS — J1282 Pneumonia due to coronavirus disease 2019: Secondary | ICD-10-CM | POA: Diagnosis not present

## 2020-10-18 DIAGNOSIS — R509 Fever, unspecified: Secondary | ICD-10-CM | POA: Diagnosis not present

## 2020-10-18 DIAGNOSIS — R0602 Shortness of breath: Secondary | ICD-10-CM | POA: Diagnosis not present

## 2020-10-18 DIAGNOSIS — I480 Paroxysmal atrial fibrillation: Secondary | ICD-10-CM | POA: Diagnosis not present

## 2020-10-18 DIAGNOSIS — N1832 Chronic kidney disease, stage 3b: Secondary | ICD-10-CM | POA: Diagnosis not present

## 2020-10-18 DIAGNOSIS — J9600 Acute respiratory failure, unspecified whether with hypoxia or hypercapnia: Secondary | ICD-10-CM | POA: Diagnosis not present

## 2020-10-18 DIAGNOSIS — I2510 Atherosclerotic heart disease of native coronary artery without angina pectoris: Secondary | ICD-10-CM | POA: Diagnosis not present

## 2020-10-19 ENCOUNTER — Telehealth: Payer: Self-pay | Admitting: Osteopathic Medicine

## 2020-10-19 DIAGNOSIS — R0602 Shortness of breath: Secondary | ICD-10-CM | POA: Diagnosis not present

## 2020-10-19 DIAGNOSIS — N179 Acute kidney failure, unspecified: Secondary | ICD-10-CM | POA: Diagnosis not present

## 2020-10-19 DIAGNOSIS — E875 Hyperkalemia: Secondary | ICD-10-CM | POA: Diagnosis not present

## 2020-10-19 DIAGNOSIS — N1832 Chronic kidney disease, stage 3b: Secondary | ICD-10-CM | POA: Diagnosis not present

## 2020-10-19 DIAGNOSIS — I480 Paroxysmal atrial fibrillation: Secondary | ICD-10-CM | POA: Diagnosis not present

## 2020-10-19 DIAGNOSIS — R918 Other nonspecific abnormal finding of lung field: Secondary | ICD-10-CM | POA: Diagnosis not present

## 2020-10-19 DIAGNOSIS — D6869 Other thrombophilia: Secondary | ICD-10-CM | POA: Diagnosis not present

## 2020-10-19 DIAGNOSIS — U071 COVID-19: Secondary | ICD-10-CM | POA: Diagnosis not present

## 2020-10-19 DIAGNOSIS — J9601 Acute respiratory failure with hypoxia: Secondary | ICD-10-CM | POA: Diagnosis not present

## 2020-10-19 DIAGNOSIS — I2510 Atherosclerotic heart disease of native coronary artery without angina pectoris: Secondary | ICD-10-CM | POA: Diagnosis not present

## 2020-10-19 DIAGNOSIS — J1282 Pneumonia due to coronavirus disease 2019: Secondary | ICD-10-CM | POA: Diagnosis not present

## 2020-10-19 NOTE — Telephone Encounter (Signed)
Patient's wife left a note for provider regarding that he has had COVID & has been in the [facility] in [place], patient's wife stated that he may be getting released today. Note placed in Provider Box. AM

## 2020-10-20 DIAGNOSIS — J1282 Pneumonia due to coronavirus disease 2019: Secondary | ICD-10-CM | POA: Diagnosis not present

## 2020-10-20 DIAGNOSIS — N179 Acute kidney failure, unspecified: Secondary | ICD-10-CM | POA: Diagnosis not present

## 2020-10-20 DIAGNOSIS — E875 Hyperkalemia: Secondary | ICD-10-CM | POA: Diagnosis not present

## 2020-10-20 DIAGNOSIS — D6869 Other thrombophilia: Secondary | ICD-10-CM | POA: Diagnosis not present

## 2020-10-20 DIAGNOSIS — I480 Paroxysmal atrial fibrillation: Secondary | ICD-10-CM | POA: Diagnosis not present

## 2020-10-20 DIAGNOSIS — U071 COVID-19: Secondary | ICD-10-CM | POA: Diagnosis not present

## 2020-10-20 DIAGNOSIS — N1832 Chronic kidney disease, stage 3b: Secondary | ICD-10-CM | POA: Diagnosis not present

## 2020-10-20 DIAGNOSIS — J9601 Acute respiratory failure with hypoxia: Secondary | ICD-10-CM | POA: Diagnosis not present

## 2020-10-20 DIAGNOSIS — I2510 Atherosclerotic heart disease of native coronary artery without angina pectoris: Secondary | ICD-10-CM | POA: Diagnosis not present

## 2020-10-21 DIAGNOSIS — J1282 Pneumonia due to coronavirus disease 2019: Secondary | ICD-10-CM | POA: Diagnosis not present

## 2020-10-21 DIAGNOSIS — U071 COVID-19: Secondary | ICD-10-CM | POA: Diagnosis not present

## 2020-10-22 DIAGNOSIS — J1282 Pneumonia due to coronavirus disease 2019: Secondary | ICD-10-CM | POA: Diagnosis not present

## 2020-10-22 DIAGNOSIS — U071 COVID-19: Secondary | ICD-10-CM | POA: Diagnosis not present

## 2020-10-23 DIAGNOSIS — J9601 Acute respiratory failure with hypoxia: Secondary | ICD-10-CM | POA: Diagnosis not present

## 2020-10-23 DIAGNOSIS — I2510 Atherosclerotic heart disease of native coronary artery without angina pectoris: Secondary | ICD-10-CM | POA: Diagnosis not present

## 2020-10-23 DIAGNOSIS — N1832 Chronic kidney disease, stage 3b: Secondary | ICD-10-CM | POA: Diagnosis not present

## 2020-10-23 DIAGNOSIS — U071 COVID-19: Secondary | ICD-10-CM | POA: Diagnosis not present

## 2020-10-23 DIAGNOSIS — E875 Hyperkalemia: Secondary | ICD-10-CM | POA: Diagnosis not present

## 2020-10-23 DIAGNOSIS — I480 Paroxysmal atrial fibrillation: Secondary | ICD-10-CM | POA: Diagnosis not present

## 2020-10-23 DIAGNOSIS — J1282 Pneumonia due to coronavirus disease 2019: Secondary | ICD-10-CM | POA: Diagnosis not present

## 2020-10-23 DIAGNOSIS — N179 Acute kidney failure, unspecified: Secondary | ICD-10-CM | POA: Diagnosis not present

## 2020-10-23 DIAGNOSIS — D6869 Other thrombophilia: Secondary | ICD-10-CM | POA: Diagnosis not present

## 2020-10-23 IMAGING — CT CT CHEST W/ CM
2 of 4 series · 15 of 36 positions shown, 18 images · IV contrast (OMNIPAQUE)
Comparison: Chest radiograph dated 11/04/2019. CT chest dated
01/30/2017.

CLINICAL DATA: Left pleural effusion, status post thoracentesis

EXAM:
CT CHEST WITH CONTRAST
TECHNIQUE: Multidetector CT imaging of the chest was performed during
intravenous contrast administration.
CONTRAST:  75mL OMNIPAQUE IOHEXOL 300 MG/ML  SOLN

[Series 2: axial st · axial · 0.90mm/px · z∈[+77,+371]mm · 12 of 173 slices shown, 15 images]
[im 13/173  mediastinal]
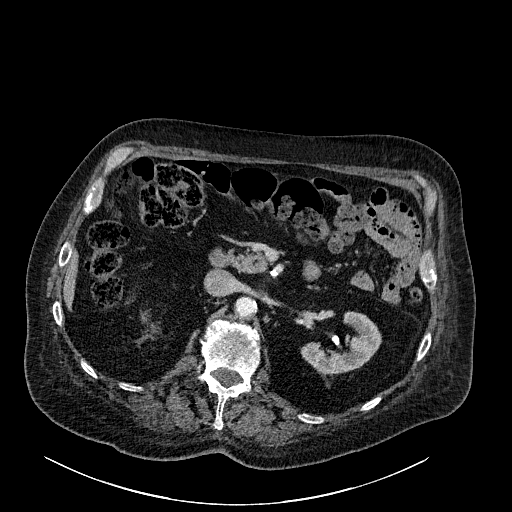
[im 13/173  lung]
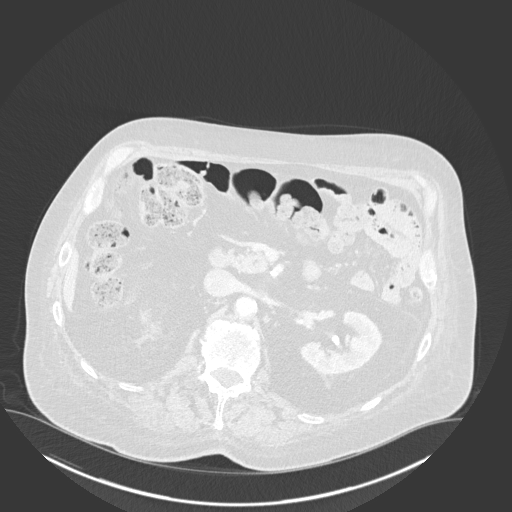
[im 25/173  lung]
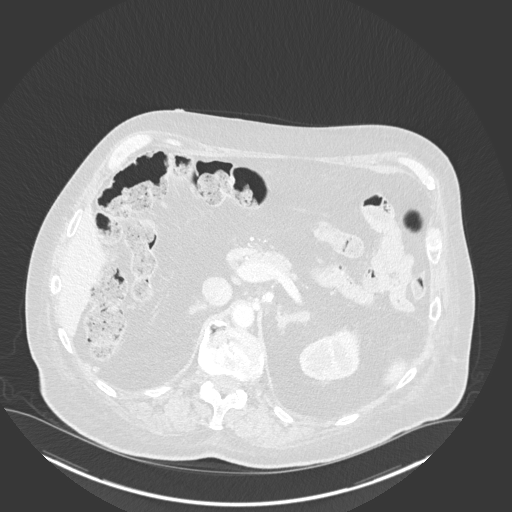
[im 37/173  lung]
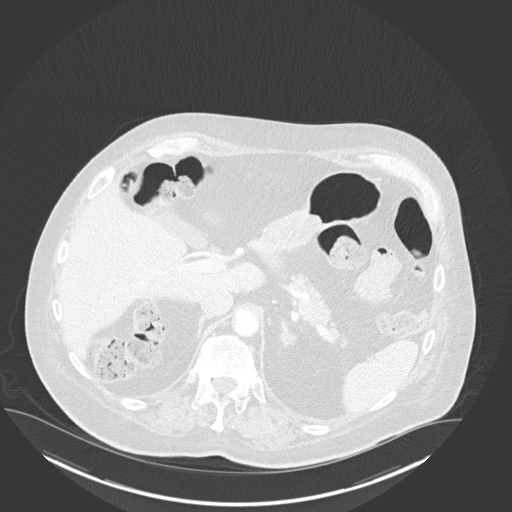
[im 50/173  lung]
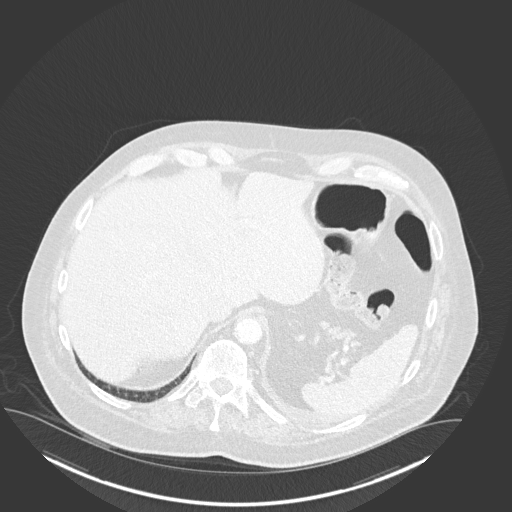
[im 62/173  mediastinal]
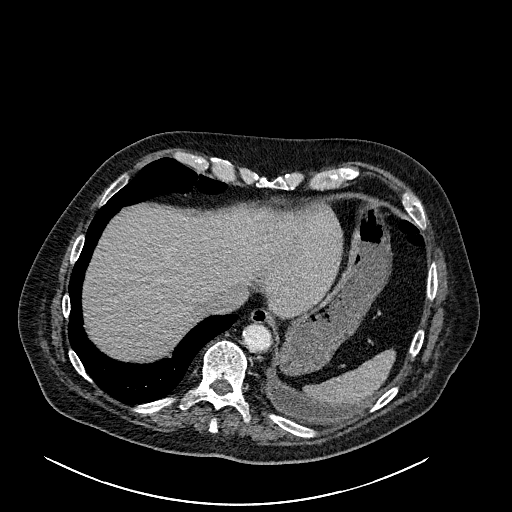
[im 62/173  lung]
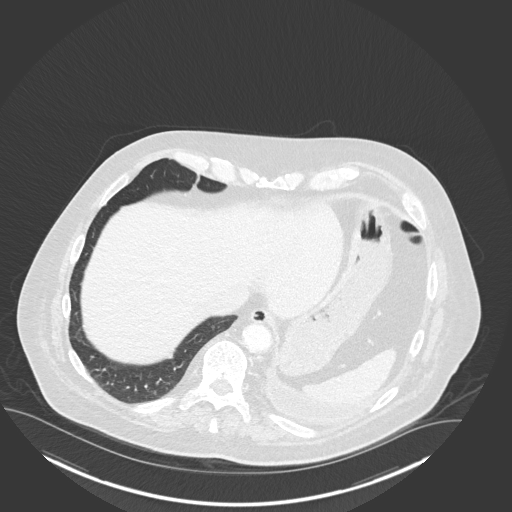
[im 74/173  lung]
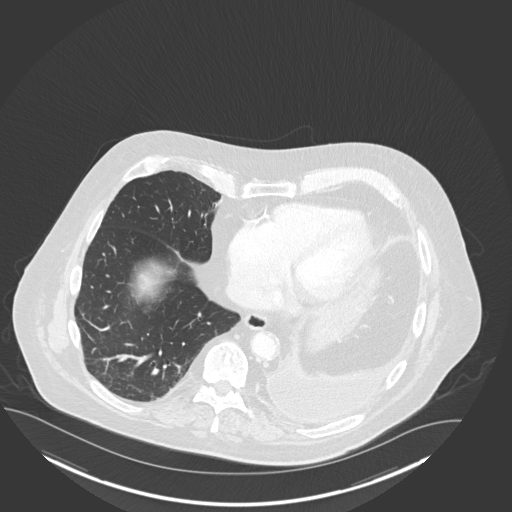
[im 99/173  lung]
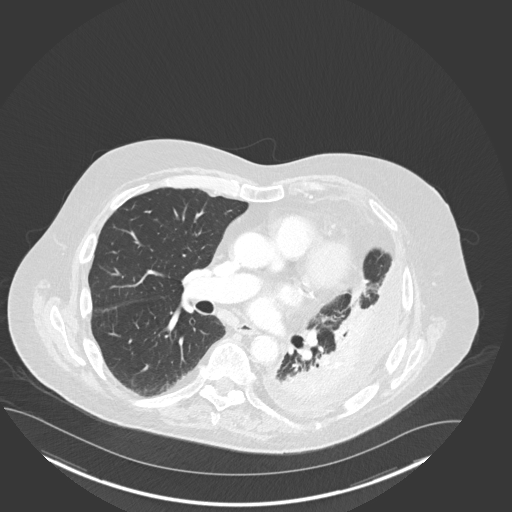
[im 111/173  lung]
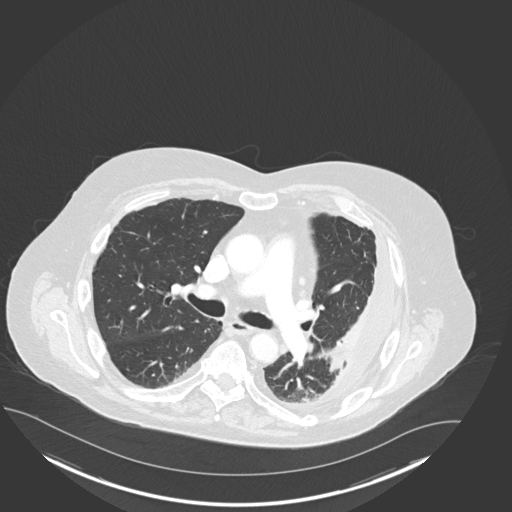
[im 123/173  mediastinal]
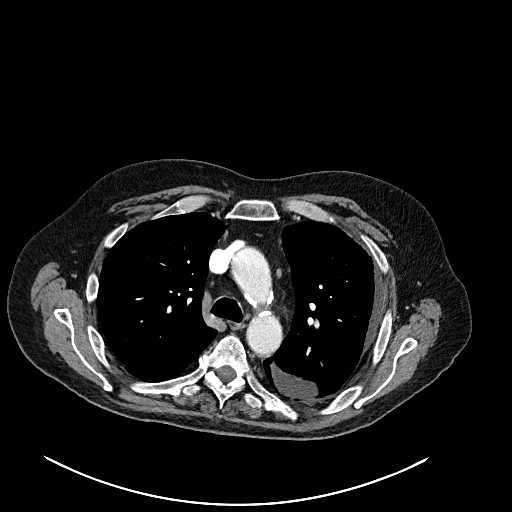
[im 123/173  lung]
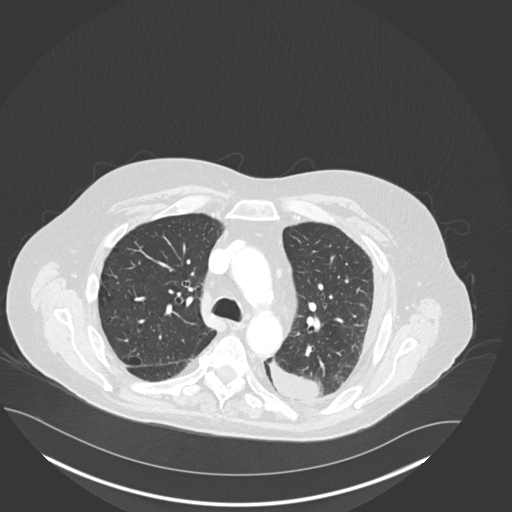
[im 136/173  lung]
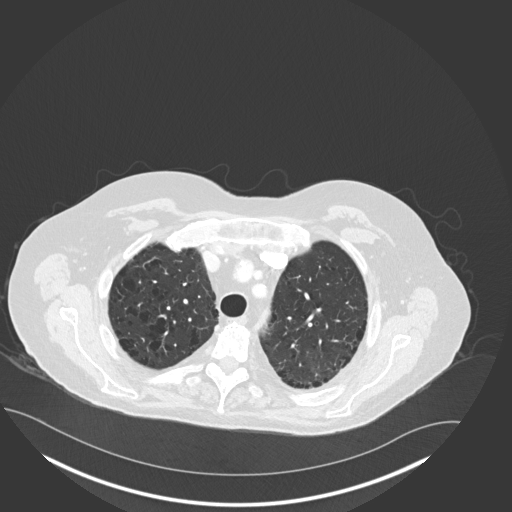
[im 148/173  lung]
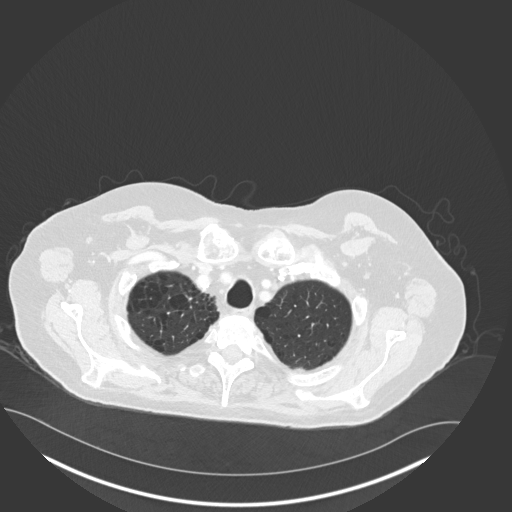
[im 160/173  lung]
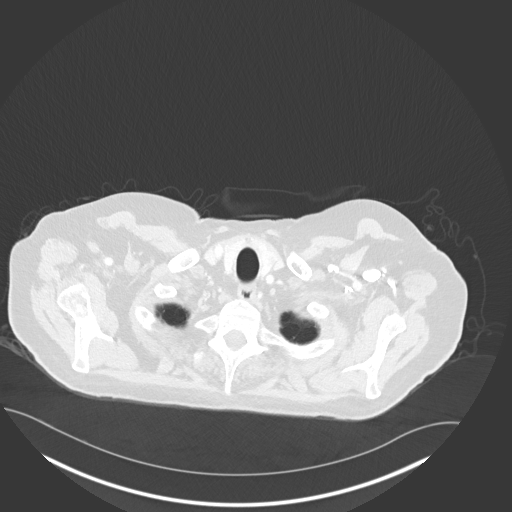

[Series 5: coronal · coronal · 0.77mm/px · 3 of 132 slices shown]
[im 27/132  lung]
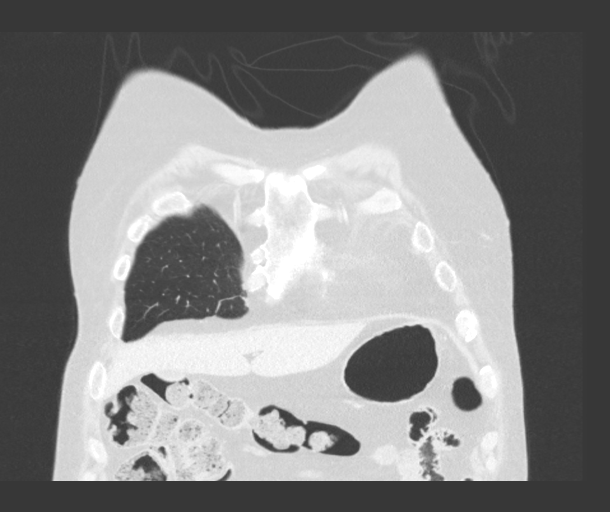
[im 53/132  lung]
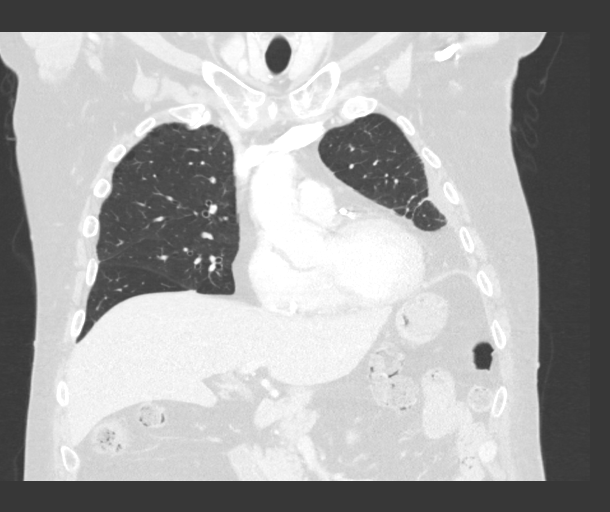
[im 79/132  lung]
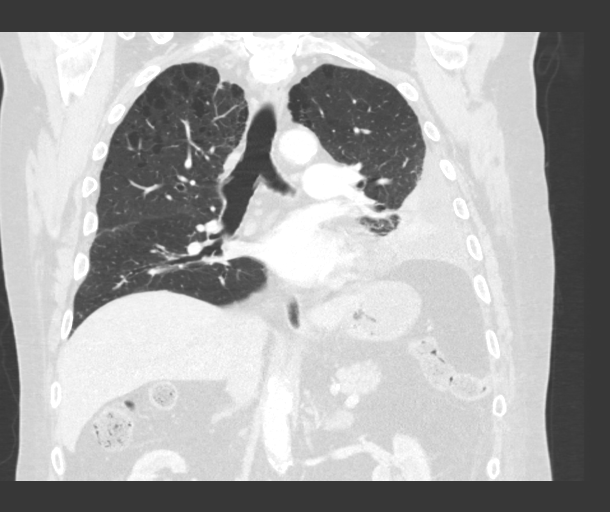

[15 of 36 positions shown; findings below may reference images not displayed]

FINDINGS: Cardiovascular: The heart is normal in size. No pericardial
effusion.

No evidence of thoracic aortic aneurysm. Atherosclerotic
calcifications of the aortic arch.

Three vessel coronary atherosclerosis.

Mediastinum/Nodes: No suspicious mediastinal lymphadenopathy.

8 mm left thyroid nodule (series 2/image 13).

Lungs/Pleura: Mild centrilobular and paraseptal emphysematous
changes, upper lung predominant.

Moderate left pleural effusion. Tiny foci of gas (series 2/image
104), likely related to recent thoracentesis. No pneumothorax.

Lingular and left lower lobe atelectasis. Associated volume loss in
the left hemithorax.

No suspicious pulmonary nodules.

No pleural effusion or pneumothorax.

Upper Abdomen: Visualized upper abdomen is notable for nodular
thickening of the left adrenal gland and nonobstructing left renal
calculi measuring up to 8 mm in the lower pole (series 2/image 131).

Musculoskeletal: Degenerative changes of the visualized
thoracolumbar spine.
IMPRESSION: Moderate left pleural effusion. Tiny foci of gas, likely related to
recent thoracentesis. No pneumothorax.

Lingular and left lower lobe atelectasis. Associated volume loss in
the left hemithorax.

Aortic Atherosclerosis (R3CBV-QDE.E) and Emphysema (R3CBV-R5S.O).

## 2020-10-26 ENCOUNTER — Encounter: Payer: Self-pay | Admitting: Osteopathic Medicine

## 2020-10-26 ENCOUNTER — Ambulatory Visit (INDEPENDENT_AMBULATORY_CARE_PROVIDER_SITE_OTHER): Payer: Medicare Other | Admitting: Osteopathic Medicine

## 2020-10-26 DIAGNOSIS — Z5329 Procedure and treatment not carried out because of patient's decision for other reasons: Secondary | ICD-10-CM

## 2020-10-26 DIAGNOSIS — Z91199 Patient's noncompliance with other medical treatment and regimen due to unspecified reason: Secondary | ICD-10-CM

## 2020-10-26 NOTE — Progress Notes (Signed)
Attempted to contact the patient at 1115 am, no answer. Left a vm msg for pt.

## 2020-10-27 DIAGNOSIS — I480 Paroxysmal atrial fibrillation: Secondary | ICD-10-CM | POA: Diagnosis not present

## 2020-10-27 DIAGNOSIS — I2510 Atherosclerotic heart disease of native coronary artery without angina pectoris: Secondary | ICD-10-CM | POA: Diagnosis not present

## 2020-10-27 DIAGNOSIS — Z7901 Long term (current) use of anticoagulants: Secondary | ICD-10-CM | POA: Diagnosis not present

## 2020-10-27 DIAGNOSIS — J9601 Acute respiratory failure with hypoxia: Secondary | ICD-10-CM | POA: Diagnosis not present

## 2020-10-27 DIAGNOSIS — U071 COVID-19: Secondary | ICD-10-CM | POA: Diagnosis not present

## 2020-10-27 DIAGNOSIS — I129 Hypertensive chronic kidney disease with stage 1 through stage 4 chronic kidney disease, or unspecified chronic kidney disease: Secondary | ICD-10-CM | POA: Diagnosis not present

## 2020-10-27 DIAGNOSIS — Z9981 Dependence on supplemental oxygen: Secondary | ICD-10-CM | POA: Diagnosis not present

## 2020-10-27 DIAGNOSIS — N1832 Chronic kidney disease, stage 3b: Secondary | ICD-10-CM | POA: Diagnosis not present

## 2020-10-27 DIAGNOSIS — Z87891 Personal history of nicotine dependence: Secondary | ICD-10-CM | POA: Diagnosis not present

## 2020-10-27 DIAGNOSIS — Z6829 Body mass index (BMI) 29.0-29.9, adult: Secondary | ICD-10-CM | POA: Diagnosis not present

## 2020-10-27 DIAGNOSIS — J1282 Pneumonia due to coronavirus disease 2019: Secondary | ICD-10-CM | POA: Diagnosis not present

## 2020-10-27 DIAGNOSIS — D6869 Other thrombophilia: Secondary | ICD-10-CM | POA: Diagnosis not present

## 2020-10-28 ENCOUNTER — Telehealth: Payer: Self-pay | Admitting: Internal Medicine

## 2020-10-28 NOTE — Telephone Encounter (Signed)
Attempted to call Leda Gauze, PT from [facility] but unable to reach and unable to leave a VM. Will try to call back later.

## 2020-11-01 ENCOUNTER — Telehealth: Payer: Self-pay

## 2020-11-01 DIAGNOSIS — B351 Tinea unguium: Secondary | ICD-10-CM | POA: Diagnosis not present

## 2020-11-01 DIAGNOSIS — R262 Difficulty in walking, not elsewhere classified: Secondary | ICD-10-CM | POA: Diagnosis not present

## 2020-11-01 NOTE — Telephone Encounter (Signed)
Task completed. Left a detailed vm msg regarding provider's recommendation. Direct call back info provided.

## 2020-11-01 NOTE — Telephone Encounter (Signed)
Titrate to goal SpO2 85% or higher, max up to 10 L/minute. If 10 L/minute and SpO2 <85 at rest needs to go to ER

## 2020-11-01 NOTE — Telephone Encounter (Signed)
Leda Gauze from [facility] called regarding pt's oxygen orders. Pt is currently on 5 liters. She wants to know how much she can titrate the pt's oxygen and what is the maximum amount? Pt's oxygen limit is 10 liters. Pls advise, thanks.

## 2020-11-02 DIAGNOSIS — J9601 Acute respiratory failure with hypoxia: Secondary | ICD-10-CM | POA: Diagnosis not present

## 2020-11-02 DIAGNOSIS — U071 COVID-19: Secondary | ICD-10-CM | POA: Diagnosis not present

## 2020-11-02 DIAGNOSIS — J1282 Pneumonia due to coronavirus disease 2019: Secondary | ICD-10-CM | POA: Diagnosis not present

## 2020-11-02 DIAGNOSIS — I129 Hypertensive chronic kidney disease with stage 1 through stage 4 chronic kidney disease, or unspecified chronic kidney disease: Secondary | ICD-10-CM | POA: Diagnosis not present

## 2020-11-02 DIAGNOSIS — N1832 Chronic kidney disease, stage 3b: Secondary | ICD-10-CM | POA: Diagnosis not present

## 2020-11-02 DIAGNOSIS — D6869 Other thrombophilia: Secondary | ICD-10-CM | POA: Diagnosis not present

## 2020-11-04 DIAGNOSIS — N1832 Chronic kidney disease, stage 3b: Secondary | ICD-10-CM | POA: Diagnosis not present

## 2020-11-04 DIAGNOSIS — U071 COVID-19: Secondary | ICD-10-CM | POA: Diagnosis not present

## 2020-11-04 DIAGNOSIS — D6869 Other thrombophilia: Secondary | ICD-10-CM | POA: Diagnosis not present

## 2020-11-04 DIAGNOSIS — I129 Hypertensive chronic kidney disease with stage 1 through stage 4 chronic kidney disease, or unspecified chronic kidney disease: Secondary | ICD-10-CM | POA: Diagnosis not present

## 2020-11-04 DIAGNOSIS — J1282 Pneumonia due to coronavirus disease 2019: Secondary | ICD-10-CM | POA: Diagnosis not present

## 2020-11-04 DIAGNOSIS — J9601 Acute respiratory failure with hypoxia: Secondary | ICD-10-CM | POA: Diagnosis not present

## 2020-11-05 NOTE — Telephone Encounter (Signed)
atc pt, no answer an no option for VM.  Wcb. Pt has appt 11/24.

## 2020-11-08 DIAGNOSIS — J9601 Acute respiratory failure with hypoxia: Secondary | ICD-10-CM | POA: Diagnosis not present

## 2020-11-08 DIAGNOSIS — I129 Hypertensive chronic kidney disease with stage 1 through stage 4 chronic kidney disease, or unspecified chronic kidney disease: Secondary | ICD-10-CM | POA: Diagnosis not present

## 2020-11-08 DIAGNOSIS — D6869 Other thrombophilia: Secondary | ICD-10-CM | POA: Diagnosis not present

## 2020-11-08 DIAGNOSIS — J1282 Pneumonia due to coronavirus disease 2019: Secondary | ICD-10-CM | POA: Diagnosis not present

## 2020-11-08 DIAGNOSIS — N1832 Chronic kidney disease, stage 3b: Secondary | ICD-10-CM | POA: Diagnosis not present

## 2020-11-08 DIAGNOSIS — U071 COVID-19: Secondary | ICD-10-CM | POA: Diagnosis not present

## 2020-11-08 NOTE — Telephone Encounter (Signed)
ATC Marilyn, unable to leave VM.  Will keep encounter open until pt's appt.

## 2020-11-09 DIAGNOSIS — U071 COVID-19: Secondary | ICD-10-CM | POA: Diagnosis not present

## 2020-11-09 DIAGNOSIS — J9601 Acute respiratory failure with hypoxia: Secondary | ICD-10-CM | POA: Diagnosis not present

## 2020-11-09 DIAGNOSIS — D6869 Other thrombophilia: Secondary | ICD-10-CM | POA: Diagnosis not present

## 2020-11-09 DIAGNOSIS — N1832 Chronic kidney disease, stage 3b: Secondary | ICD-10-CM | POA: Diagnosis not present

## 2020-11-09 DIAGNOSIS — I129 Hypertensive chronic kidney disease with stage 1 through stage 4 chronic kidney disease, or unspecified chronic kidney disease: Secondary | ICD-10-CM | POA: Diagnosis not present

## 2020-11-09 DIAGNOSIS — J1282 Pneumonia due to coronavirus disease 2019: Secondary | ICD-10-CM | POA: Diagnosis not present

## 2020-11-09 NOTE — Progress Notes (Signed)
@Patient  ID: Timothy [name], male    DOB: [birth year], 79 y.o.   MRN: [id number]  Chief Complaint  Patient presents with  . Hospitalization Follow-up    Pt states he believes his breathing has improved since he has been out of the hospital. Pt's spouse states pt has been coughing a lot which is worse at night.    Referring provider: Emeterio Reeve, DO  HPI: 79 year old male, former smoker (50-pack-year history).  Past medical history significant for left loculated pleural effusion, A. fib, hypertension, myocardial infarction, recurrent right foot swelling.  Dr. Melvyn Novas, office on 11/11/2019 for pleural effusion/shortness of breath. Onset symptoms October 2020 with low grade fever/chills. He underwent thoracentesis on 10/30/2019 with 400cc and again on 11/10/2019 with only 5 cc /severely loculated. He was sent to Dr. Roxan Hockey in December 2020, underwent drainage of effusion and decortication on 11/26/19. No evidence of malignancy, felt likely to be postpneumonic effusion.    11/10/2020- Interim hx Patient was recently admitted to [facility] from 10/09/2020- 10/23/20 for acute respiratory failure with hypoxia and pneumonia due to COVID-19.  He was outside the window to receive remdesivir but was started on Decadron.  Oxygen requirements continued to wax and wane.  CTA chest showed no evidence of PE.  He was started on cefepime due to concern for superinfection.  He was eventually weaned down to 5 L nasal cannula.  He received 15-day course of Decadron and IL 6 inhibitor on 10/12/20.  He was discharged on Medrol Dosepak and Eliquis 5 mg twice daily.  Patient presents today for hospital follow-up for COVID-19 pneumonia. Accompanied by his wife and a younger male family member. His oxygen tank ran out while in the waiting room, O2 was 80% on RA and recovered to 96% on 6L. His wife feels he is getting some better. He has a dry cough at night and gets out of breath with activity. He is on 6-7L  oxygen during the day and his wife increased it to 8L at night occasionally. He is getting physically therapy and nursing at home. He is not getting much activity, his wife states that he does not want to participate in therapy. He uses walker to ambulate just a couple of steps. He is still taking Eliquis 5mg  twice daily d/t hx afib and recent covid-19.     No Known Allergies  Immunization History  Administered Date(s) Administered  . Influenza-Unspecified 11/07/2012, 11/10/2013, 11/23/2014  . Pneumococcal Conjugate-13 11/23/2014  . Pneumococcal Polysaccharide-23 06/30/2010    Past Medical History:  Diagnosis Date  . Arthritis 09/08/2015  . CAD (coronary artery disease) 09/07/2015   S/P RCA stent x2, Hx MI, (+)ACEI, Statin, BB, ASA - 10/2012   . Essential hypertension 09/07/2015  . History of kidney stones 09/20/2015   11/2013 75mm stone  . Hyperlipidemia 09/07/2015  . Myocardial infarction ([place])    11/2011, s/p RCA stent  . Osteopenia 10/29/2015   FRAX Score: 10 year risk of major osteoporotic fracture 8.9%, hip fracture 3.2%, prescription treatment is indicated, called in VitD/Ca, +/- bisphosphanate   . PAF (paroxysmal atrial fibrillation) ([place])   . Screening for AAA (aortic abdominal aneurysm) 09/20/2015   Neg 06/11/15 per record review  . Seasonal allergies 09/07/2015    Tobacco History: Social History   Tobacco Use  Smoking Status Former Smoker  . Packs/day: 1.00  . Years: 50.00  . Pack years: 50.00  . Types: Cigarettes  . Start date: 11/16/2012  Smokeless Tobacco Never Used  Tobacco Comment  Encouraged to remain smoke free   Counseling given: Not Answered Comment: Encouraged to remain smoke free   Outpatient Medications Prior to Visit  Medication Sig Dispense Refill  . Calcium Carbonate-Vitamin D 600-400 MG-UNIT chew tablet Chew 2 tablets by mouth daily. 180 tablet 3  . metoprolol succinate (TOPROL-XL) 25 MG 24 hr tablet TAKE ONE-HALF (1/2) TABLET DAILY 45 tablet 1   . montelukast (SINGULAIR) 10 MG tablet TAKE 1 TABLET DAILY 90 tablet 1  . Multiple Vitamins-Minerals (AIRBORNE PO) Take 1 tablet by mouth at bedtime.     Marland Kitchen telmisartan (MICARDIS) 40 MG tablet Take 1 tablet (40 mg total) by mouth daily. 90 tablet 2  . Turmeric 500 MG TABS Take 1,000 mg by mouth daily.     Marland Kitchen aspirin 81 MG tablet Take 1 tablet (81 mg total) by mouth daily. 90 tablet 3  . meloxicam (MOBIC) 15 MG tablet One tab PO qAM with a meal for 2 weeks, then daily prn pain. 30 tablet 3  . predniSONE (DELTASONE) 50 MG tablet One tab PO daily for 5 days. 5 tablet 0   No facility-administered medications prior to visit.   Review of Systems  Review of Systems  Constitutional: Positive for fatigue.  HENT: Positive for postnasal drip.   Respiratory: Positive for shortness of breath. Negative for chest tightness.   Cardiovascular: Positive for leg swelling.   Physical Exam  BP 116/64 (BP Location: Left Arm, Patient Position: Sitting, Cuff Size: Normal)   Pulse 100   Ht 6' (1.829 m)   Wt 219 lb 12.8 oz (99.7 kg)   SpO2 96%   BMI 29.81 kg/m  Physical Exam Constitutional:      General: He is not in acute distress.    Appearance: Normal appearance.  Cardiovascular:     Rate and Rhythm: Normal rate and regular rhythm.  Pulmonary:     Effort: Pulmonary effort is normal.     Breath sounds: Rales present. No wheezing.  Neurological:     General: No focal deficit present.     Mental Status: He is alert and oriented to person, place, and time. Mental status is at baseline.  Psychiatric:        Mood and Affect: Mood normal.        Behavior: Behavior normal.        Thought Content: Thought content normal.        Judgment: Judgment normal.      Lab Results:  CBC    Component Value Date/Time   WBC 9.2 11/28/2019 0540   RBC 3.21 (L) 11/28/2019 0540   HGB 9.5 (L) 11/28/2019 0540   HCT 30.6 (L) 11/28/2019 0540   PLT 273 11/28/2019 0540   MCV 95.3 11/28/2019 0540   MCH 29.6  11/28/2019 0540   MCHC 31.0 11/28/2019 0540   RDW 13.2 11/28/2019 0540   LYMPHSABS 2.3 11/04/2019 1555   MONOABS 1.2 (H) 11/04/2019 1555   EOSABS 0.4 11/04/2019 1555   BASOSABS 0.1 11/04/2019 1555    BMET    Component Value Date/Time   NA 139 11/28/2019 0540   NA 144 02/25/2013 0000   K 4.6 11/28/2019 0540   CL 103 11/28/2019 0540   CO2 27 11/28/2019 0540   GLUCOSE 91 11/28/2019 0540   BUN 18 11/28/2019 0540   BUN 26 (A) 02/25/2013 0000   CREATININE 1.02 11/28/2019 0540   CREATININE 0.93 10/28/2019 1448   CALCIUM 8.8 (L) 11/28/2019 0540   GFRNONAA >60 11/28/2019 0540  GFRNONAA 78 10/28/2019 1448   GFRAA >60 11/28/2019 0540   GFRAA 90 10/28/2019 1448    BNP No results found for: BNP  ProBNP No results found for: PROBNP  Imaging: DG Chest 2 View  Result Date: 11/10/2020 CLINICAL DATA:  Follow up COVID pneumonia. EXAM: CHEST - 2 VIEW COMPARISON:  Radiographs 10/01/2020 and 12/23/2019.  CT 11/10/2019. FINDINGS: The lateral view was repeated. There are lower lung volumes. The heart size and mediastinal contours are grossly stable without adenopathy. There are new extensive ground-glass opacities in both lungs with architectural distortion and diffuse interstitial thickening consistent with viral pneumonia. No consolidation, significant pleural effusion or pneumothorax. The bones appear unremarkable. IMPRESSION: New extensive ground-glass opacities in both lungs consistent with viral pneumonia. Electronically Signed   By: Richardean Sale M.D.   On: 11/10/2020 12:01     Assessment & Plan:   Pneumonia due to COVID-19 virus Admitted to [facility] from 10/09/20-10/23/20 for covid-19 pneumonia and acute respiratory failure. He was outside the window for Remdesivir but started on Decdaron and Cefepime. New O2 requirements. CTA negative for PE. He is doing ok, still requiring 6L oxygen. He is not very active, needs a lot of encouragement. Getting nursing and physical therapy at  home. CXR today showed new extensive ground-glass opacities in both lungs with architectural distortion and diffuse interstitial thickening consistent with viral pneumonia. Sending in RX for additional prednisone taper and albuterol to use prn shortness of breath/chest tightness. Strongly encourage patient use IS every hour and ambulate four times a day. Follow-up in 4 weeks. May consider high resolution CT chest, concern he may develop post covid fibrosis.   Plan: - Prednisone taper (40mg  x 3 days; 30mg  x 3 days; 20mg  x 3 days; 10mg  x 3 days) - Albuterol to use prn shortness of breath/chest tightness - Encourage IS hourly and ambulating 4x/day  - Consider HRCT and PFTs at next visit   Acute respiratory failure due to COVID-19 ([place]) - O2 was 80% on RA today; continue 6L oxygen continuously  - Checking ONO on oxygen - Continue physical therapy and eventually try and wean O2 requirements   FU in 2- 4 weeks with Dr. Alethia Berthold, NP 11/10/2020

## 2020-11-10 ENCOUNTER — Ambulatory Visit (INDEPENDENT_AMBULATORY_CARE_PROVIDER_SITE_OTHER): Payer: Medicare Other

## 2020-11-10 ENCOUNTER — Ambulatory Visit (INDEPENDENT_AMBULATORY_CARE_PROVIDER_SITE_OTHER): Payer: Medicare Other | Admitting: Primary Care

## 2020-11-10 ENCOUNTER — Other Ambulatory Visit: Payer: Self-pay

## 2020-11-10 ENCOUNTER — Encounter: Payer: Self-pay | Admitting: Primary Care

## 2020-11-10 VITALS — BP 116/64 | HR 100 | Ht 72.0 in | Wt 219.8 lb

## 2020-11-10 DIAGNOSIS — U071 COVID-19: Secondary | ICD-10-CM

## 2020-11-10 DIAGNOSIS — R0602 Shortness of breath: Secondary | ICD-10-CM

## 2020-11-10 DIAGNOSIS — J9600 Acute respiratory failure, unspecified whether with hypoxia or hypercapnia: Secondary | ICD-10-CM | POA: Diagnosis not present

## 2020-11-10 DIAGNOSIS — M7989 Other specified soft tissue disorders: Secondary | ICD-10-CM

## 2020-11-10 DIAGNOSIS — J90 Pleural effusion, not elsewhere classified: Secondary | ICD-10-CM | POA: Diagnosis not present

## 2020-11-10 DIAGNOSIS — J1282 Pneumonia due to coronavirus disease 2019: Secondary | ICD-10-CM

## 2020-11-10 DIAGNOSIS — J189 Pneumonia, unspecified organism: Secondary | ICD-10-CM | POA: Diagnosis not present

## 2020-11-10 MED ORDER — ALBUTEROL SULFATE HFA 108 (90 BASE) MCG/ACT IN AERS
1.0000 | INHALATION_SPRAY | Freq: Four times a day (QID) | RESPIRATORY_TRACT | 1 refills | Status: DC | PRN
Start: 2020-11-10 — End: 2021-09-14

## 2020-11-10 MED ORDER — PREDNISONE 10 MG PO TABS: ORAL_TABLET | ORAL | 0 refills | Status: DC

## 2020-11-10 NOTE — Patient Instructions (Addendum)
Nose bleeds: - Ty over the counter AYR nasal gel to nasal passages  - Use saline nasal spray 1-2 times a day, decrease flonase use    Leg swelling: - Try wearing compression stockings during the day (take them off at night)  Left ear bloackage: - Try using Debrox for 3 days prior to ear lavage (call PCP if need to set up appointment for this)  Chronic respiratory failure d/t covid-19 pneumonia: - Use breathing exercise (incentrive spirometer) every hour while awake - Do walking exercises minimum of FOUR times a day   Orders: - Labs today - CXR today - ONO on 6L   Rx: - Albuterol rescue inhaler, take 2 puffs every 4-6 hours as needed for shortness of breath   Follow-up: - 2-4 weeks with Dr. Melvyn Novas or his first available

## 2020-11-10 NOTE — Assessment & Plan Note (Signed)
-   O2 was 80% on RA today; continue 6L oxygen continuously  - Checking ONO on oxygen - Continue physical therapy and eventually try and wean O2 requirements

## 2020-11-10 NOTE — Assessment & Plan Note (Addendum)
Admitted to [facility] from 10/09/20-10/23/20 for covid-19 pneumonia and acute respiratory failure. He was outside the window for Remdesivir but started on Decdaron and Cefepime. New O2 requirements. CTA negative for PE. He is doing ok, still requiring 6L oxygen. He is not very active, needs a lot of encouragement. Getting nursing and physical therapy at home. CXR today showed new extensive ground-glass opacities in both lungs with architectural distortion and diffuse interstitial thickening consistent with viral pneumonia. Sending in RX for additional prednisone taper and albuterol to use prn shortness of breath/chest tightness. Strongly encourage patient use IS every hour and ambulate four times a day. Follow-up in 4 weeks. May consider high resolution CT chest, concern he may develop post covid fibrosis.   Plan: - Prednisone taper (40mg  x 3 days; 30mg  x 3 days; 20mg  x 3 days; 10mg  x 3 days) - Albuterol to use prn shortness of breath/chest tightness - Encourage IS hourly and ambulating 4x/day  - Consider HRCT and PFTs at next visit

## 2020-11-11 LAB — CBC WITH DIFFERENTIAL/PLATELET
Absolute Monocytes: 860 cells/uL (ref 200–950)
Basophils Absolute: 43 cells/uL (ref 0–200)
Basophils Relative: 0.5 %
Eosinophils Absolute: 387 cells/uL (ref 15–500)
Eosinophils Relative: 4.5 %
HCT: 37.1 % — ABNORMAL LOW (ref 38.5–50.0)
Hemoglobin: 12.6 g/dL — ABNORMAL LOW (ref 13.2–17.1)
Lymphs Abs: 3474 cells/uL (ref 850–3900)
MCH: 31 pg (ref 27.0–33.0)
MCHC: 34 g/dL (ref 32.0–36.0)
MCV: 91.2 fL (ref 80.0–100.0)
MPV: 10.5 fL (ref 7.5–12.5)
Monocytes Relative: 10 %
Neutro Abs: 3836 cells/uL (ref 1500–7800)
Neutrophils Relative %: 44.6 %
Platelets: 413 10*3/uL — ABNORMAL HIGH (ref 140–400)
RBC: 4.07 10*6/uL — ABNORMAL LOW (ref 4.20–5.80)
RDW: 12.9 % (ref 11.0–15.0)
Total Lymphocyte: 40.4 %
WBC: 8.6 10*3/uL (ref 3.8–10.8)

## 2020-11-11 LAB — BASIC METABOLIC PANEL
BUN: 15 mg/dL (ref 7–25)
CO2: 31 mmol/L (ref 20–32)
Calcium: 9.3 mg/dL (ref 8.6–10.3)
Chloride: 102 mmol/L (ref 98–110)
Creat: 0.84 mg/dL (ref 0.70–1.11)
Glucose, Bld: 100 mg/dL — ABNORMAL HIGH (ref 65–99)
Potassium: 4.5 mmol/L (ref 3.5–5.3)
Sodium: 141 mmol/L (ref 135–146)

## 2020-11-11 LAB — BRAIN NATRIURETIC PEPTIDE: Brain Natriuretic Peptide: 95 pg/mL (ref <100)

## 2020-11-11 IMAGING — DX DG CHEST 1V PORT
1 series · 1 of 1 positions shown · non-contrast
Comparison: November 28, 2019.

CLINICAL DATA: Pleural effusion.

EXAM:
PORTABLE CHEST 1 VIEW

[chest]
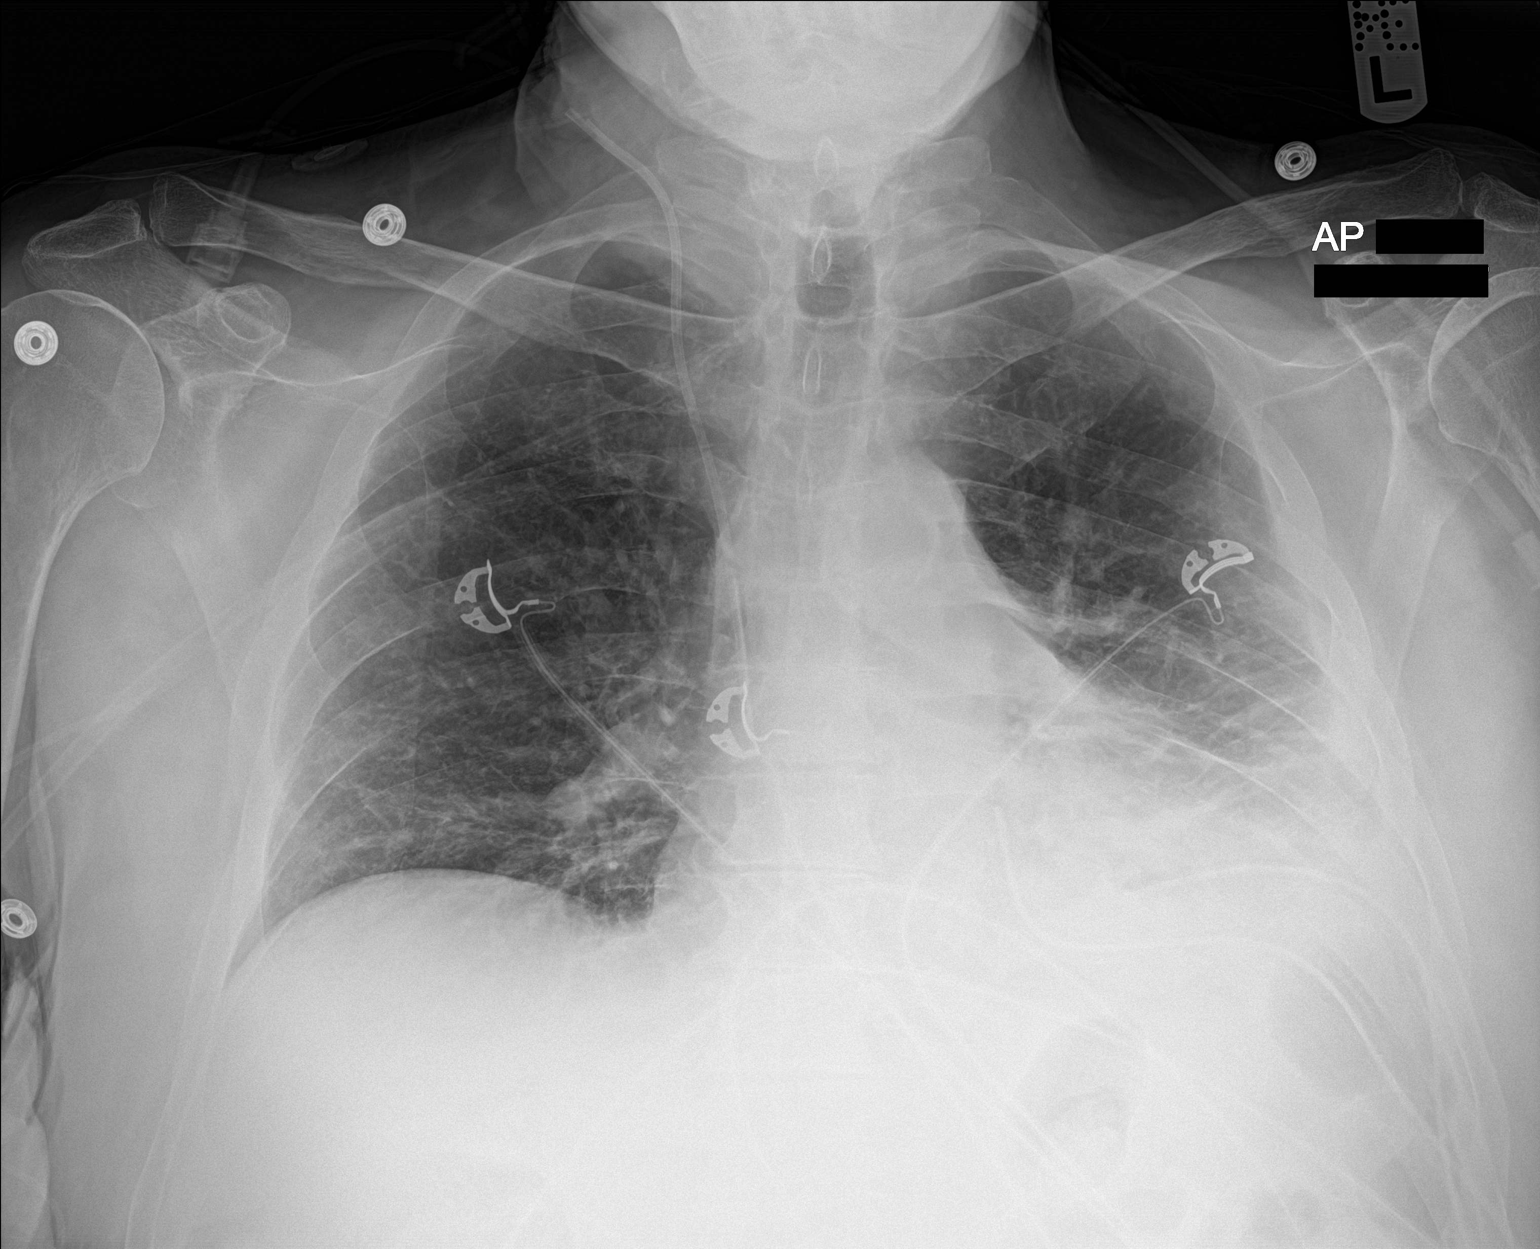

[1 of 1 positions shown; findings below may reference images not displayed]

FINDINGS: Stable cardiomediastinal silhouette. Stable position of right
internal jugular catheter. No pneumothorax is noted. Right lung is
clear. Stable left basilar pleural thickening and opacity is noted.
Two left-sided chest tubes are unchanged in position. Bony thorax is
unremarkable.
IMPRESSION: Stable left basilar pleural thickening and opacity is noted. Two
left-sided chest tubes are unchanged in position. No pneumothorax is
noted.

## 2020-11-12 DIAGNOSIS — I252 Old myocardial infarction: Secondary | ICD-10-CM | POA: Diagnosis not present

## 2020-11-12 DIAGNOSIS — Z955 Presence of coronary angioplasty implant and graft: Secondary | ICD-10-CM | POA: Diagnosis not present

## 2020-11-12 DIAGNOSIS — I480 Paroxysmal atrial fibrillation: Secondary | ICD-10-CM | POA: Diagnosis not present

## 2020-11-12 DIAGNOSIS — I10 Essential (primary) hypertension: Secondary | ICD-10-CM | POA: Diagnosis not present

## 2020-11-12 DIAGNOSIS — I2510 Atherosclerotic heart disease of native coronary artery without angina pectoris: Secondary | ICD-10-CM | POA: Diagnosis not present

## 2020-11-16 DIAGNOSIS — D6869 Other thrombophilia: Secondary | ICD-10-CM | POA: Diagnosis not present

## 2020-11-16 DIAGNOSIS — J1282 Pneumonia due to coronavirus disease 2019: Secondary | ICD-10-CM | POA: Diagnosis not present

## 2020-11-16 DIAGNOSIS — I129 Hypertensive chronic kidney disease with stage 1 through stage 4 chronic kidney disease, or unspecified chronic kidney disease: Secondary | ICD-10-CM | POA: Diagnosis not present

## 2020-11-16 DIAGNOSIS — U071 COVID-19: Secondary | ICD-10-CM | POA: Diagnosis not present

## 2020-11-16 DIAGNOSIS — J9601 Acute respiratory failure with hypoxia: Secondary | ICD-10-CM | POA: Diagnosis not present

## 2020-11-16 DIAGNOSIS — N1832 Chronic kidney disease, stage 3b: Secondary | ICD-10-CM | POA: Diagnosis not present

## 2020-11-16 NOTE — Telephone Encounter (Signed)
Will sign off. Pt seen 11/10/2020.

## 2020-11-17 ENCOUNTER — Other Ambulatory Visit: Payer: Self-pay

## 2020-11-17 ENCOUNTER — Ambulatory Visit (INDEPENDENT_AMBULATORY_CARE_PROVIDER_SITE_OTHER): Payer: Medicare Other | Admitting: Osteopathic Medicine

## 2020-11-17 VITALS — BP 120/74 | HR 77 | Temp 98.4°F | Wt 229.0 lb

## 2020-11-17 DIAGNOSIS — H9193 Unspecified hearing loss, bilateral: Secondary | ICD-10-CM | POA: Diagnosis not present

## 2020-11-17 NOTE — Progress Notes (Signed)
Timothy [name] is a 79 y.o. male who presents to  [facility]  today, 11/17/20, seeking care for the following:   Concern for diminished hearing, was told to get ears cleaned out. On exam, TM visible, canals normal, no significant cerumen, wife states she has been cleaning the ears. NO tinnitus, headache, vision change, vertigo, syncope/presyncope.   Pt otherwise okay, still struggling recovering from [place], on O2. Limited participation in physical therapy / rehab.      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   Definitely more HoH than in the past Advised imaging if any concerning secondary symptoms develop, otherwise would f/u w/ audiology   On exam, wheelchair on O2, CV RRR S1S2 WNL, L CTAB w/ mild rales at bases w/ improvement on few deep breaths c/w atelectasis   No results found for this or any previous visit (from the past 24 hour(s)).   There are no Patient Instructions on file for this visit.  No orders of the defined types were placed in this encounter.   No orders of the defined types were placed in this encounter.      Follow-up instructions: No follow-ups on file.                                         BP 120/74 (BP Location: Right Arm, Patient Position: Sitting, Cuff Size: Large)    Pulse 77    Temp 98.4 F (36.9 C) (Oral)    Wt 229 lb (103.9 kg)    SpO2 97%    BMI 31.06 kg/m   Current Meds  Medication Sig   albuterol (VENTOLIN HFA) 108 (90 Base) MCG/ACT inhaler Inhale 1-2 puffs into the lungs every 6 (six) hours as needed for wheezing or shortness of breath.   Calcium Carbonate-Vitamin D 600-400 MG-UNIT chew tablet Chew 2 tablets by mouth daily.   metoprolol succinate (TOPROL-XL) 25 MG 24 hr tablet TAKE ONE-HALF (1/2) TABLET DAILY   montelukast (SINGULAIR) 10 MG tablet TAKE 1 TABLET DAILY    Multiple Vitamins-Minerals (AIRBORNE PO) Take 1 tablet by mouth at bedtime.    predniSONE (DELTASONE) 10 MG tablet Take 4 tabs po daily x 3 days; then 3 tabs daily x3 days; then 2 tabs daily x3 days; then 1 tab daily x 3 days; then stop   telmisartan (MICARDIS) 40 MG tablet Take 1 tablet (40 mg total) by mouth daily.   Turmeric 500 MG TABS Take 1,000 mg by mouth daily.     No results found for this or any previous visit (from the past 72 hour(s)).  No results found.     All questions at time of visit were answered - patient instructed to contact office with any additional concerns or updates.  ER/RTC precautions were reviewed with the patient as applicable.   Please note: voice recognition software was used to produce this document, and typos may escape review. Please contact Dr. Sheppard Coil for any needed clarifications.

## 2020-11-18 DIAGNOSIS — D6869 Other thrombophilia: Secondary | ICD-10-CM | POA: Diagnosis not present

## 2020-11-18 DIAGNOSIS — U071 COVID-19: Secondary | ICD-10-CM | POA: Diagnosis not present

## 2020-11-18 DIAGNOSIS — J1282 Pneumonia due to coronavirus disease 2019: Secondary | ICD-10-CM | POA: Diagnosis not present

## 2020-11-18 DIAGNOSIS — I129 Hypertensive chronic kidney disease with stage 1 through stage 4 chronic kidney disease, or unspecified chronic kidney disease: Secondary | ICD-10-CM | POA: Diagnosis not present

## 2020-11-18 DIAGNOSIS — N1832 Chronic kidney disease, stage 3b: Secondary | ICD-10-CM | POA: Diagnosis not present

## 2020-11-18 DIAGNOSIS — J9601 Acute respiratory failure with hypoxia: Secondary | ICD-10-CM | POA: Diagnosis not present

## 2020-11-19 ENCOUNTER — Other Ambulatory Visit: Payer: Self-pay | Admitting: Osteopathic Medicine

## 2020-11-19 ENCOUNTER — Telehealth: Payer: Self-pay | Admitting: Osteopathic Medicine

## 2020-11-19 DIAGNOSIS — J302 Other seasonal allergic rhinitis: Secondary | ICD-10-CM

## 2020-11-19 NOTE — Telephone Encounter (Signed)
Patient's wife stopped by and stated that patient was out of medication listed below and that Dr A has not prescribed this before, it was originally prescribed at the Hospital but stated Dr A said she would call this in for him when patient was out. Also, Zamari Vea, patients wife, stated she had a missed phone call from office and there was no VM left. AM  Eliquis 5MG  Generic: Apixban Tab 5MG 

## 2020-11-19 NOTE — Telephone Encounter (Signed)
Forwarding to provider. New rx.

## 2020-11-20 MED ORDER — APIXABAN 5 MG PO TABS: 5.0000 mg | ORAL_TABLET | Freq: Two times a day (BID) | ORAL | 3 refills | Status: DC

## 2020-11-23 DIAGNOSIS — N1832 Chronic kidney disease, stage 3b: Secondary | ICD-10-CM | POA: Diagnosis not present

## 2020-11-23 DIAGNOSIS — J9601 Acute respiratory failure with hypoxia: Secondary | ICD-10-CM | POA: Diagnosis not present

## 2020-11-23 DIAGNOSIS — J1282 Pneumonia due to coronavirus disease 2019: Secondary | ICD-10-CM | POA: Diagnosis not present

## 2020-11-23 DIAGNOSIS — I129 Hypertensive chronic kidney disease with stage 1 through stage 4 chronic kidney disease, or unspecified chronic kidney disease: Secondary | ICD-10-CM | POA: Diagnosis not present

## 2020-11-23 DIAGNOSIS — D6869 Other thrombophilia: Secondary | ICD-10-CM | POA: Diagnosis not present

## 2020-11-23 DIAGNOSIS — U071 COVID-19: Secondary | ICD-10-CM | POA: Diagnosis not present

## 2020-11-25 DIAGNOSIS — N1832 Chronic kidney disease, stage 3b: Secondary | ICD-10-CM | POA: Diagnosis not present

## 2020-11-25 DIAGNOSIS — I129 Hypertensive chronic kidney disease with stage 1 through stage 4 chronic kidney disease, or unspecified chronic kidney disease: Secondary | ICD-10-CM | POA: Diagnosis not present

## 2020-11-25 DIAGNOSIS — J9601 Acute respiratory failure with hypoxia: Secondary | ICD-10-CM | POA: Diagnosis not present

## 2020-11-25 DIAGNOSIS — D6869 Other thrombophilia: Secondary | ICD-10-CM | POA: Diagnosis not present

## 2020-11-25 DIAGNOSIS — J1282 Pneumonia due to coronavirus disease 2019: Secondary | ICD-10-CM | POA: Diagnosis not present

## 2020-11-25 DIAGNOSIS — U071 COVID-19: Secondary | ICD-10-CM | POA: Diagnosis not present

## 2020-11-26 DIAGNOSIS — Z9981 Dependence on supplemental oxygen: Secondary | ICD-10-CM | POA: Diagnosis not present

## 2020-11-26 DIAGNOSIS — D6869 Other thrombophilia: Secondary | ICD-10-CM | POA: Diagnosis not present

## 2020-11-26 DIAGNOSIS — U071 COVID-19: Secondary | ICD-10-CM | POA: Diagnosis not present

## 2020-11-26 DIAGNOSIS — I129 Hypertensive chronic kidney disease with stage 1 through stage 4 chronic kidney disease, or unspecified chronic kidney disease: Secondary | ICD-10-CM | POA: Diagnosis not present

## 2020-11-26 DIAGNOSIS — Z87891 Personal history of nicotine dependence: Secondary | ICD-10-CM | POA: Diagnosis not present

## 2020-11-26 DIAGNOSIS — I480 Paroxysmal atrial fibrillation: Secondary | ICD-10-CM | POA: Diagnosis not present

## 2020-11-26 DIAGNOSIS — Z6829 Body mass index (BMI) 29.0-29.9, adult: Secondary | ICD-10-CM | POA: Diagnosis not present

## 2020-11-26 DIAGNOSIS — J9601 Acute respiratory failure with hypoxia: Secondary | ICD-10-CM | POA: Diagnosis not present

## 2020-11-26 DIAGNOSIS — N1832 Chronic kidney disease, stage 3b: Secondary | ICD-10-CM | POA: Diagnosis not present

## 2020-11-26 DIAGNOSIS — J1282 Pneumonia due to coronavirus disease 2019: Secondary | ICD-10-CM | POA: Diagnosis not present

## 2020-11-26 DIAGNOSIS — Z7901 Long term (current) use of anticoagulants: Secondary | ICD-10-CM | POA: Diagnosis not present

## 2020-11-26 DIAGNOSIS — I2510 Atherosclerotic heart disease of native coronary artery without angina pectoris: Secondary | ICD-10-CM | POA: Diagnosis not present

## 2020-11-30 DIAGNOSIS — J9601 Acute respiratory failure with hypoxia: Secondary | ICD-10-CM | POA: Diagnosis not present

## 2020-11-30 DIAGNOSIS — N1832 Chronic kidney disease, stage 3b: Secondary | ICD-10-CM | POA: Diagnosis not present

## 2020-11-30 DIAGNOSIS — I129 Hypertensive chronic kidney disease with stage 1 through stage 4 chronic kidney disease, or unspecified chronic kidney disease: Secondary | ICD-10-CM | POA: Diagnosis not present

## 2020-11-30 DIAGNOSIS — U071 COVID-19: Secondary | ICD-10-CM | POA: Diagnosis not present

## 2020-11-30 DIAGNOSIS — J1282 Pneumonia due to coronavirus disease 2019: Secondary | ICD-10-CM | POA: Diagnosis not present

## 2020-11-30 DIAGNOSIS — D6869 Other thrombophilia: Secondary | ICD-10-CM | POA: Diagnosis not present

## 2020-12-02 ENCOUNTER — Other Ambulatory Visit: Payer: Self-pay

## 2020-12-02 DIAGNOSIS — J9601 Acute respiratory failure with hypoxia: Secondary | ICD-10-CM | POA: Diagnosis not present

## 2020-12-02 DIAGNOSIS — U071 COVID-19: Secondary | ICD-10-CM | POA: Diagnosis not present

## 2020-12-02 DIAGNOSIS — I129 Hypertensive chronic kidney disease with stage 1 through stage 4 chronic kidney disease, or unspecified chronic kidney disease: Secondary | ICD-10-CM | POA: Diagnosis not present

## 2020-12-02 DIAGNOSIS — D6869 Other thrombophilia: Secondary | ICD-10-CM | POA: Diagnosis not present

## 2020-12-02 DIAGNOSIS — N1832 Chronic kidney disease, stage 3b: Secondary | ICD-10-CM | POA: Diagnosis not present

## 2020-12-02 DIAGNOSIS — J1282 Pneumonia due to coronavirus disease 2019: Secondary | ICD-10-CM | POA: Diagnosis not present

## 2020-12-02 MED ORDER — APIXABAN 5 MG PO TABS: 5.0000 mg | ORAL_TABLET | Freq: Two times a day (BID) | ORAL | 3 refills | Status: DC

## 2020-12-06 ENCOUNTER — Telehealth: Payer: Self-pay

## 2020-12-06 NOTE — Telephone Encounter (Signed)
E/S m/o pharmacy requesting med refills for fluticasone prop nasal spray. Rx not listed in active med list.

## 2020-12-07 ENCOUNTER — Telehealth: Payer: Self-pay

## 2020-12-07 DIAGNOSIS — J9601 Acute respiratory failure with hypoxia: Secondary | ICD-10-CM | POA: Diagnosis not present

## 2020-12-07 DIAGNOSIS — I129 Hypertensive chronic kidney disease with stage 1 through stage 4 chronic kidney disease, or unspecified chronic kidney disease: Secondary | ICD-10-CM | POA: Diagnosis not present

## 2020-12-07 DIAGNOSIS — J1282 Pneumonia due to coronavirus disease 2019: Secondary | ICD-10-CM | POA: Diagnosis not present

## 2020-12-07 DIAGNOSIS — D6869 Other thrombophilia: Secondary | ICD-10-CM | POA: Diagnosis not present

## 2020-12-07 DIAGNOSIS — U071 COVID-19: Secondary | ICD-10-CM | POA: Diagnosis not present

## 2020-12-07 DIAGNOSIS — N1832 Chronic kidney disease, stage 3b: Secondary | ICD-10-CM | POA: Diagnosis not present

## 2020-12-07 MED ORDER — FLUTICASONE PROPIONATE 50 MCG/ACT NA SUSP: NASAL | 3 refills | Status: DC

## 2020-12-07 NOTE — Telephone Encounter (Signed)
Pt's wife called requesting a U/A test for pt. She mentioned that she thinks that patient currently having concerns of blood in his urine. She stated that patient had a fall a couple of days ago. Pls advise, thanks.

## 2020-12-08 NOTE — Telephone Encounter (Signed)
Needs visit here or UC  Blood in urine after trauma is potentially serious

## 2020-12-08 NOTE — Telephone Encounter (Signed)
Task completed. Pt's wife has been updated of provider's note. Agreeable with provider's recommendation to take pt to UC. She will keep provider updated as needed.

## 2020-12-13 DIAGNOSIS — J1282 Pneumonia due to coronavirus disease 2019: Secondary | ICD-10-CM | POA: Diagnosis not present

## 2020-12-13 DIAGNOSIS — N1832 Chronic kidney disease, stage 3b: Secondary | ICD-10-CM | POA: Diagnosis not present

## 2020-12-13 DIAGNOSIS — J9601 Acute respiratory failure with hypoxia: Secondary | ICD-10-CM | POA: Diagnosis not present

## 2020-12-13 DIAGNOSIS — I129 Hypertensive chronic kidney disease with stage 1 through stage 4 chronic kidney disease, or unspecified chronic kidney disease: Secondary | ICD-10-CM | POA: Diagnosis not present

## 2020-12-13 DIAGNOSIS — D6869 Other thrombophilia: Secondary | ICD-10-CM | POA: Diagnosis not present

## 2020-12-13 DIAGNOSIS — U071 COVID-19: Secondary | ICD-10-CM | POA: Diagnosis not present

## 2020-12-15 DIAGNOSIS — U071 COVID-19: Secondary | ICD-10-CM | POA: Diagnosis not present

## 2020-12-15 DIAGNOSIS — D6869 Other thrombophilia: Secondary | ICD-10-CM | POA: Diagnosis not present

## 2020-12-15 DIAGNOSIS — J9601 Acute respiratory failure with hypoxia: Secondary | ICD-10-CM | POA: Diagnosis not present

## 2020-12-15 DIAGNOSIS — N1832 Chronic kidney disease, stage 3b: Secondary | ICD-10-CM | POA: Diagnosis not present

## 2020-12-15 DIAGNOSIS — I129 Hypertensive chronic kidney disease with stage 1 through stage 4 chronic kidney disease, or unspecified chronic kidney disease: Secondary | ICD-10-CM | POA: Diagnosis not present

## 2020-12-15 DIAGNOSIS — J1282 Pneumonia due to coronavirus disease 2019: Secondary | ICD-10-CM | POA: Diagnosis not present

## 2020-12-21 DIAGNOSIS — J1282 Pneumonia due to coronavirus disease 2019: Secondary | ICD-10-CM | POA: Diagnosis not present

## 2020-12-21 DIAGNOSIS — U071 COVID-19: Secondary | ICD-10-CM | POA: Diagnosis not present

## 2020-12-21 DIAGNOSIS — N1832 Chronic kidney disease, stage 3b: Secondary | ICD-10-CM | POA: Diagnosis not present

## 2020-12-21 DIAGNOSIS — D6869 Other thrombophilia: Secondary | ICD-10-CM | POA: Diagnosis not present

## 2020-12-21 DIAGNOSIS — I129 Hypertensive chronic kidney disease with stage 1 through stage 4 chronic kidney disease, or unspecified chronic kidney disease: Secondary | ICD-10-CM | POA: Diagnosis not present

## 2020-12-21 DIAGNOSIS — J9601 Acute respiratory failure with hypoxia: Secondary | ICD-10-CM | POA: Diagnosis not present

## 2020-12-23 ENCOUNTER — Ambulatory Visit (INDEPENDENT_AMBULATORY_CARE_PROVIDER_SITE_OTHER): Payer: Medicare Other

## 2020-12-23 ENCOUNTER — Ambulatory Visit (INDEPENDENT_AMBULATORY_CARE_PROVIDER_SITE_OTHER): Payer: Medicare Other | Admitting: Internal Medicine

## 2020-12-23 ENCOUNTER — Other Ambulatory Visit: Payer: Self-pay

## 2020-12-23 ENCOUNTER — Encounter: Payer: Self-pay | Admitting: Internal Medicine

## 2020-12-23 DIAGNOSIS — J9611 Chronic respiratory failure with hypoxia: Secondary | ICD-10-CM | POA: Diagnosis not present

## 2020-12-23 DIAGNOSIS — J90 Pleural effusion, not elsewhere classified: Secondary | ICD-10-CM

## 2020-12-23 NOTE — Patient Instructions (Addendum)
Goal is to keep your 02 sats above 90% no higher needed  No change in medications.  Please remember to go to the  x-ray department  for your tests - we will call you with the results when they are available.   I very strongly recommend you get the moderna or pfizer vaccine as soon as possible based on your risk of dying from the virus  and the proven safety and benefit of these vaccines against even the omicron variants.      Please schedule a follow up visit in 6  months but call sooner if needed

## 2020-12-23 NOTE — Progress Notes (Signed)
Timothy [name], male    DOB: 22-Sep-1941,   MRN: [id number]   Brief patient profile:  8 yowm retired [employer] quit smoking 2013 and vaping about mid 09/2019 with dry cough  Since 2005 while on ACEi  New onset L ant and lower post  Chest pain mid  oct 2020 chills and low grade  admitted 10/29/2019 and dx L Pl effusion with pleural plaque L base on CT chest  >>  thoracentesis 10/30/2019 x 400 cc serous fluid exudative with Polys > Lymphs and inflammatory cytology so rx'd as parapneumonic and self referred to pulmonary clinic 11/04/2019      History of Present Illness  11/04/2019  Pulmonary/ 1st office eval/Timothy [name]  Chief Complaint  Patient presents with  . Pulmonary Consult    Self referral for pleural effusion. He c/o SOB and non prod cough for the past month. He is using an albuterol inhaler 2-3 x per day  Dyspnea:  MMRC3 = can't walk 100 yards even at a slow pace at a flat grade s stopping due to sob   Cough: dry daytime and also night x years  Sleep: Has not been able to lie flat in bed back pain x 40 y SABA use: new start, does not help  L chest pain with cough is better as is sob p tcentesis Rec: Re tap and do ct p tap > done 11/10/23 x 5 cc only with glucose up to 75 and no evidence of lung ca   11/11/2019  f/u ov/Timothy [name] re:  L loculated pleural effusion  Chief Complaint  Patient presents with  . Follow-up    Discuss results of ct chest. Breathing is unchanged since the last visit.   Dyspnea:  = MMRC3 = can't walk 100 yards even at a slow pace at a flat grade s stopping due to sob   Cough: not productive at all  Sleeping: baselin 45 degrees x 40 years, now ever since oct 2020  having to sit upright SABA use:  Starts working p about 10 min helps chest congestion lasts for hours   02: no  Recurrent  R foot swelling x  Years no cahnge  Having daily chills since oct 2021 set Temp up 78 to compensate which usually he leaves at 70 Skin test [place]  1990s no sputum available but told it was  "false positive"  rec Only use your albuterol as a rescue medication to be used if you can't catch your breath by resting or you have chest heaviness  - The less you use it, the better it will work when you need it. - Ok to use up to 2 puffs  every 4 hours if you must but call for immediate appointment if use goes up over your usual need - Don't leave home without it !!  (think of it like the spare tire for your car)  Thoracic surgery Dr. Roxan Hockey in Dec 2020, underwent drainage of effusion and decortication on 11/26/19. No evidence of malignancy, felt likely to be postpneumonic effusion.   Admitted with covid 19 pna  10/09/20 p onset of symtoms 09/30/20   12/23/2020  f/u ov/Timothy [name] re:  S/p decortication on L  11/26/2019  Chief Complaint  Patient presents with  . Follow-up    Breathing has improved some since his last visit. He has occ cough at night. He is using his albuterol inhaler 3 x per day on average.   Dyspnea: more limited  by energy than breathing  Cough: hs p lie down / non productive trend is better  Sleeping: hosp bed @ in 45 degrees SABA use: as above  02: 3-4 lpm sats running low 90s    No obvious day to day or daytime variability or assoc excess/ purulent sputum or mucus plugs or hemoptysis or cp or chest tightness, subjective wheeze or overt sinus or hb symptoms.   Sleeping as above ok without nocturnal  or early am exacerbation  of respiratory  c/o's or need for noct saba. Also denies any obvious fluctuation of symptoms with weather or environmental changes or other aggravating or alleviating factors except as outlined above   No unusual exposure hx or h/o childhood pna/ asthma or knowledge of premature birth.  Current Allergies, Complete Past Medical History, Past Surgical History, Family History, and Social History were reviewed in [employer] record.  ROS  The following are not active complaints  unless bolded Hoarseness, sore throat, dysphagia, dental problems, itching, sneezing,  nasal congestion or discharge of excess mucus or purulent secretions, ear ache,   fever, chills, sweats, unintended wt loss or wt gain, classically pleuritic or exertional cp,  orthopnea pnd or arm/hand swelling  or leg swelling, presyncope, palpitations, abdominal pain, anorexia, nausea, vomiting, diarrhea  or change in bowel habits or change in bladder habits, change in stools or change in urine, dysuria, hematuria,  rash, arthralgias, visual complaints, headache, numbness, weakness or ataxia or problems with walking or coordination,  change in mood or  memory.        Current Meds  Medication Sig  . albuterol (VENTOLIN HFA) 108 (90 Base) MCG/ACT inhaler Inhale 1-2 puffs into the lungs every 6 (six) hours as needed for wheezing or shortness of breath.  Marland Kitchen apixaban (ELIQUIS) 5 MG TABS tablet Take 1 tablet (5 mg total) by mouth 2 (two) times daily.  . Calcium Carbonate-Vitamin D 600-400 MG-UNIT chew tablet Chew 2 tablets by mouth daily.  . fluticasone (FLONASE) 50 MCG/ACT nasal spray One spray in each nostril twice a day, use left hand for right nostril, and right hand for left nostril.  . metoprolol succinate (TOPROL-XL) 25 MG 24 hr tablet TAKE ONE-HALF (1/2) TABLET DAILY  . montelukast (SINGULAIR) 10 MG tablet TAKE 1 TABLET DAILY  . Multiple Vitamins-Minerals (AIRBORNE PO) Take 1 tablet by mouth at bedtime.   Marland Kitchen telmisartan (MICARDIS) 40 MG tablet Take 20 mg by mouth daily.  . Turmeric 500 MG TABS Take 1,000 mg by mouth daily.                   Past Medical History:  Diagnosis Date  . Arthritis 09/08/2015  . CAD (coronary artery disease) 09/07/2015   S/P RCA stent x2, Hx MI, (+)ACEI, Statin, BB, ASA - 10/2012   . Essential hypertension 09/07/2015  . History of kidney stones 09/20/2015   11/2013 38mm stone  . Hyperlipidemia 09/07/2015  . Osteopenia 10/29/2015   FRAX Score: 10 year risk of major  osteoporotic fracture 8.9%, hip fracture 3.2%, prescription treatment is indicated, called in VitD/Ca, +/- bisphosphanate   . Screening for AAA (aortic abdominal aneurysm) 09/20/2015   Neg 06/11/15 per record review  . Seasonal allergies 09/07/2015       Objective:       12/23/2020         225  11/11/19 226 lb (102.5 kg)  11/04/19 226 lb (102.5 kg)  11/03/19 224 lb 1.9 oz (101.7 kg)  Vital [name] reviewed  12/23/2020  - Note at rest 02 sats  97% on 3lpm cont   General appearance:  W/c bound chronically ill  elderly wm      HEENT : pt wearing mask not removed for exam due to covid -19 concerns.    NECK :  without JVD/Nodes/TM/ nl carotid upstrokes bilaterally   LUNGS: no acc muscle use,  Nl contour chest which is clear to A and P bilaterally without cough on insp or exp maneuvers   CV:  slt irreg  no s3 or murmur or increase in P2, and  Trace to 1+ ptting R > L LE   ABD:  soft and nontender with nl inspiratory excursion . No bruits or organomegaly appreciated, bowel sounds nl  MS:    ext warm without deformities, calf tenderness, cyanosis or clubbing No obvious joint restrictions   SKIN: warm and dry without lesions    NEURO:  alert, approp, nl sensorium with  no motor or cerebellar deficits apparent.    CXR PA and Lateral:   12/23/2020 :    I personally reviewed images and agree with radiology impression as follows:   Improved lung aeration with residual interstitial prominence likely reflecting scarring. My review: lung volumes are reduced             Assessment

## 2020-12-24 ENCOUNTER — Encounter: Payer: Self-pay | Admitting: Internal Medicine

## 2020-12-24 DIAGNOSIS — I129 Hypertensive chronic kidney disease with stage 1 through stage 4 chronic kidney disease, or unspecified chronic kidney disease: Secondary | ICD-10-CM | POA: Diagnosis not present

## 2020-12-24 DIAGNOSIS — J9601 Acute respiratory failure with hypoxia: Secondary | ICD-10-CM | POA: Diagnosis not present

## 2020-12-24 DIAGNOSIS — U071 COVID-19: Secondary | ICD-10-CM | POA: Diagnosis not present

## 2020-12-24 DIAGNOSIS — I480 Paroxysmal atrial fibrillation: Secondary | ICD-10-CM | POA: Diagnosis not present

## 2020-12-24 DIAGNOSIS — J1282 Pneumonia due to coronavirus disease 2019: Secondary | ICD-10-CM | POA: Diagnosis not present

## 2020-12-24 DIAGNOSIS — N1832 Chronic kidney disease, stage 3b: Secondary | ICD-10-CM | POA: Diagnosis not present

## 2020-12-24 DIAGNOSIS — I252 Old myocardial infarction: Secondary | ICD-10-CM | POA: Diagnosis not present

## 2020-12-24 DIAGNOSIS — D6869 Other thrombophilia: Secondary | ICD-10-CM | POA: Diagnosis not present

## 2020-12-24 DIAGNOSIS — I2510 Atherosclerotic heart disease of native coronary artery without angina pectoris: Secondary | ICD-10-CM | POA: Diagnosis not present

## 2020-12-24 DIAGNOSIS — J9611 Chronic respiratory failure with hypoxia: Secondary | ICD-10-CM | POA: Insufficient documentation

## 2020-12-24 DIAGNOSIS — Z955 Presence of coronary angioplasty implant and graft: Secondary | ICD-10-CM | POA: Diagnosis not present

## 2020-12-24 NOTE — Assessment & Plan Note (Signed)
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 Roxan Hockey in Dec 2020, underwent drainage of effusion and decortication on 11/26/19. No evidence of malignancy, felt likely to be postpneumonic effusion.  - cxr 12/23/2020 s significant reaccumulation   No f/u needed

## 2020-12-24 NOTE — Assessment & Plan Note (Signed)
Admitted with covid 19 pna  10/09/20 p onset of symtoms 09/30/20  12/23/20 Patient Saturations on Room Air at Rest = 80% Patient Saturations on 6 Liters of oxygen while Ambulating = 96%  Advised  Titrate to sats > 90% at all times - more with activity, less at rest  Patient is unvaccinated and was informed of the seriousness of COVID 19 infection as a direct risk to lung health as well as safety to close contacts and should continue to wear a facemask in public and minimize exposure to public locations but especially avoid any area or activity where non-close contacts are not observing distancing or wearing an appropriate face mask.  I strongly recommended pt take either of the vaccines available through local drugstores based on updated information on millions of Americans treated with the [place] products  which have proven both safe and  effective even against the  new omicron variant to prevent hospitalization and death as he likely had the delta based on the timing of prior infection.          Each maintenance medication was reviewed in detail including emphasizing most importantly the difference between maintenance and prns and under what circumstances the prns are to be triggered using an action plan format where appropriate.  Total time for H and P, chart review, counseling, teaching device and generating customized AVS unique to this office visit / charting = 22 min

## 2020-12-26 DIAGNOSIS — J9601 Acute respiratory failure with hypoxia: Secondary | ICD-10-CM | POA: Diagnosis not present

## 2020-12-26 DIAGNOSIS — J1282 Pneumonia due to coronavirus disease 2019: Secondary | ICD-10-CM | POA: Diagnosis not present

## 2020-12-26 DIAGNOSIS — I2510 Atherosclerotic heart disease of native coronary artery without angina pectoris: Secondary | ICD-10-CM | POA: Diagnosis not present

## 2020-12-26 DIAGNOSIS — U071 COVID-19: Secondary | ICD-10-CM | POA: Diagnosis not present

## 2020-12-26 DIAGNOSIS — Z6829 Body mass index (BMI) 29.0-29.9, adult: Secondary | ICD-10-CM | POA: Diagnosis not present

## 2020-12-26 DIAGNOSIS — Z9981 Dependence on supplemental oxygen: Secondary | ICD-10-CM | POA: Diagnosis not present

## 2020-12-26 DIAGNOSIS — I480 Paroxysmal atrial fibrillation: Secondary | ICD-10-CM | POA: Diagnosis not present

## 2020-12-26 DIAGNOSIS — D6869 Other thrombophilia: Secondary | ICD-10-CM | POA: Diagnosis not present

## 2020-12-26 DIAGNOSIS — N1832 Chronic kidney disease, stage 3b: Secondary | ICD-10-CM | POA: Diagnosis not present

## 2020-12-26 DIAGNOSIS — Z7901 Long term (current) use of anticoagulants: Secondary | ICD-10-CM | POA: Diagnosis not present

## 2020-12-26 DIAGNOSIS — I129 Hypertensive chronic kidney disease with stage 1 through stage 4 chronic kidney disease, or unspecified chronic kidney disease: Secondary | ICD-10-CM | POA: Diagnosis not present

## 2020-12-26 DIAGNOSIS — Z87891 Personal history of nicotine dependence: Secondary | ICD-10-CM | POA: Diagnosis not present

## 2020-12-30 DIAGNOSIS — D6869 Other thrombophilia: Secondary | ICD-10-CM | POA: Diagnosis not present

## 2020-12-30 DIAGNOSIS — J1282 Pneumonia due to coronavirus disease 2019: Secondary | ICD-10-CM | POA: Diagnosis not present

## 2020-12-30 DIAGNOSIS — I129 Hypertensive chronic kidney disease with stage 1 through stage 4 chronic kidney disease, or unspecified chronic kidney disease: Secondary | ICD-10-CM | POA: Diagnosis not present

## 2020-12-30 DIAGNOSIS — N1832 Chronic kidney disease, stage 3b: Secondary | ICD-10-CM | POA: Diagnosis not present

## 2020-12-30 DIAGNOSIS — J9601 Acute respiratory failure with hypoxia: Secondary | ICD-10-CM | POA: Diagnosis not present

## 2020-12-30 DIAGNOSIS — U071 COVID-19: Secondary | ICD-10-CM | POA: Diagnosis not present

## 2021-01-06 DIAGNOSIS — D6869 Other thrombophilia: Secondary | ICD-10-CM | POA: Diagnosis not present

## 2021-01-06 DIAGNOSIS — U071 COVID-19: Secondary | ICD-10-CM | POA: Diagnosis not present

## 2021-01-06 DIAGNOSIS — N1832 Chronic kidney disease, stage 3b: Secondary | ICD-10-CM | POA: Diagnosis not present

## 2021-01-06 DIAGNOSIS — J1282 Pneumonia due to coronavirus disease 2019: Secondary | ICD-10-CM | POA: Diagnosis not present

## 2021-01-06 DIAGNOSIS — J9601 Acute respiratory failure with hypoxia: Secondary | ICD-10-CM | POA: Diagnosis not present

## 2021-01-06 DIAGNOSIS — I129 Hypertensive chronic kidney disease with stage 1 through stage 4 chronic kidney disease, or unspecified chronic kidney disease: Secondary | ICD-10-CM | POA: Diagnosis not present

## 2021-01-07 ENCOUNTER — Ambulatory Visit (INDEPENDENT_AMBULATORY_CARE_PROVIDER_SITE_OTHER): Payer: Medicare Other | Admitting: Osteopathic Medicine

## 2021-01-07 ENCOUNTER — Other Ambulatory Visit: Payer: Self-pay | Admitting: General Practice

## 2021-01-07 DIAGNOSIS — Z0000 Encounter for general adult medical examination without abnormal findings: Secondary | ICD-10-CM

## 2021-01-07 NOTE — Patient Instructions (Addendum)
 Fall Prevention in the Home, Adult Falls can cause injuries and can happen to people of all ages. There are many things you can do to make your home safe and to help prevent falls. Ask for help when making these changes. What actions can I take to prevent falls? General Instructions  Use good lighting in all rooms. Replace any light bulbs that burn out.  Turn on the lights in dark areas. Use night-lights.  Keep items that you use often in easy-to-reach places. Lower the shelves around your home if needed.  Set up your furniture so you have a clear path. Avoid moving your furniture around.  Do not have throw rugs or other things on the floor that can make you trip.  Avoid walking on wet floors.  If any of your floors are uneven, fix them.  Add color or contrast paint or tape to clearly mark and help you see: ? Grab bars or handrails. ? First and last steps of staircases. ? Where the edge of each step is.  If you use a stepladder: ? Make sure that it is fully opened. Do not climb a closed stepladder. ? Make sure the sides of the stepladder are locked in place. ? Ask someone to hold the stepladder while you use it.  Know where your pets are when moving through your home. What can I do in the bathroom?  Keep the floor dry. Clean up any water on the floor right away.  Remove soap buildup in the tub or shower.  Use nonskid mats or decals on the floor of the tub or shower.  Attach bath mats securely with double-sided, nonslip rug tape.  If you need to sit down in the shower, use a plastic, nonslip stool.  Install grab bars by the toilet and in the tub and shower. Do not use towel bars as grab bars.      What can I do in the bedroom?  Make sure that you have a light by your bed that is easy to reach.  Do not use any sheets or blankets for your bed that hang to the floor.  Have a firm chair with side arms that you can use for support when you get dressed. What can I do  in the kitchen?  Clean up any spills right away.  If you need to reach something above you, use a step stool with a grab bar.  Keep electrical cords out of the way.  Do not use floor polish or wax that makes floors slippery. What can I do with my stairs?  Do not leave any items on the stairs.  Make sure that you have a light switch at the top and the bottom of the stairs.  Make sure that there are handrails on both sides of the stairs. Fix handrails that are broken or loose.  Install nonslip stair treads on all your stairs.  Avoid having throw rugs at the top or bottom of the stairs.  Choose a carpet that does not hide the edge of the steps on the stairs.  Check carpeting to make sure that it is firmly attached to the stairs. Fix carpet that is loose or worn. What can I do on the outside of my home?  Use bright outdoor lighting.  Fix the edges of walkways and driveways and fix any cracks.  Remove anything that might make you trip as you walk through a door, such as a raised step or threshold.  Trim   any bushes or trees on paths to your home.  Check to see if handrails are loose or broken and that both sides of all steps have handrails.  Install guardrails along the edges of any raised decks and porches.  Clear paths of anything that can make you trip, such as tools or rocks.  Have leaves, snow, or ice cleared regularly.  Use sand or salt on paths during winter.  Clean up any spills in your garage right away. This includes grease or oil spills. What other actions can I take?  Wear shoes that: ? Have a low heel. Do not wear high heels. ? Have rubber bottoms. ? Feel good on your feet and fit well. ? Are closed at the toe. Do not wear open-toe sandals.  Use tools that help you move around if needed. These include: ? Canes. ? Walkers. ? Scooters. ? Crutches.  Review your medicines with your doctor. Some medicines can make you feel dizzy. This can increase your  chance of falling. Ask your doctor what else you can do to help prevent falls. Where to find more information  Centers for Disease Control and Prevention, [employer]: www.cdc.gov  National Institute on Aging: www.nia.nih.gov Contact a doctor if:  You are afraid of falling at home.  You feel weak, drowsy, or dizzy at home.  You fall at home. Summary  There are many simple things that you can do to make your home safe and to help prevent falls.  Ways to make your home safe include removing things that can make you trip and installing grab bars in the bathroom.  Ask for help when making these changes in your home. This information is not intended to replace advice given to you by your health care provider. Make sure you discuss any questions you have with your health care provider. Document Revised: 07/07/2020 Document Reviewed: 07/07/2020 Elsevier Patient Education  2021 Else[employer] Inc.   Health Maintenance, Male Adopting a healthy lifestyle and getting preventive care are important in promoting health and wellness. Ask your health care provider about:  The right schedule for you to have regular tests and exams.  Things you can do on your own to prevent diseases and keep yourself healthy. What should I know about diet, weight, and exercise? Eat a healthy diet  Eat a diet that includes plenty of vegetables, fruits, low-fat dairy products, and lean protein.  Do not eat a lot of foods that are high in solid fats, added sugars, or sodium.   Maintain a healthy weight Body mass index (BMI) is a measurement that can be used to identify possible weight problems. It estimates body fat based on height and weight. Your health care provider can help determine your BMI and help you achieve or maintain a healthy weight. Get regular exercise Get regular exercise. This is one of the most important things you can do for your health. Most adults should:  Exercise for at least 150 minutes each week.  The exercise should increase your heart rate and make you sweat (moderate-intensity exercise).  Do strengthening exercises at least twice a week. This is in addition to the moderate-intensity exercise.  Spend less time sitting. Even light physical activity can be beneficial. Watch cholesterol and blood lipids Have your blood tested for lipids and cholesterol at 81 years of age, then have this test every 5 years. You may need to have your cholesterol levels checked more often if:  Your lipid or cholesterol levels are high.  You are   older than 81 years of age.  You are at high risk for heart disease. What should I know about cancer screening? Many types of cancers can be detected early and may often be prevented. Depending on your health history and family history, you may need to have cancer screening at various ages. This may include screening for:  Colorectal cancer.  Prostate cancer.  Skin cancer.  Lung cancer. What should I know about heart disease, diabetes, and high blood pressure? Blood pressure and heart disease  High blood pressure causes heart disease and increases the risk of stroke. This is more likely to develop in people who have high blood pressure readings, are of African descent, or are overweight.  Talk with your health care provider about your target blood pressure readings.  Have your blood pressure checked: ? Every 3-5 years if you are 18-39 years of age. ? Every year if you are 40 years old or older.  If you are between the ages of 65 and 75 and are a current or former smoker, ask your health care provider if you should have a one-time screening for abdominal aortic aneurysm (AAA). Diabetes Have regular diabetes screenings. This checks your fasting blood sugar level. Have the screening done:  Once every three years after age 45 if you are at a normal weight and have a low risk for diabetes.  More often and at a younger age if you are overweight or have a  high risk for diabetes. What should I know about preventing infection? Hepatitis B If you have a higher risk for hepatitis B, you should be screened for this virus. Talk with your health care provider to find out if you are at risk for hepatitis B infection. Hepatitis C Blood testing is recommended for:  Everyone born from 1945 through 1965.  Anyone with known risk factors for hepatitis C. Sexually transmitted infections (STIs)  You should be screened each year for STIs, including gonorrhea and chlamydia, if: ? You are sexually active and are younger than 81 years of age. ? You are older than 81 years of age and your health care provider tells you that you are at risk for this type of infection. ? Your sexual activity has changed since you were last screened, and you are at increased risk for chlamydia or gonorrhea. Ask your health care provider if you are at risk.  Ask your health care provider about whether you are at high risk for HIV. Your health care provider may recommend a prescription medicine to help prevent HIV infection. If you choose to take medicine to prevent HIV, you should first get tested for HIV. You should then be tested every 3 months for as long as you are taking the medicine. Follow these instructions at home: Lifestyle  Do not use any products that contain nicotine or tobacco, such as cigarettes, e-cigarettes, and chewing tobacco. If you need help quitting, ask your health care provider.  Do not use street drugs.  Do not share needles.  Ask your health care provider for help if you need support or information about quitting drugs. Alcohol use  Do not drink alcohol if your health care provider tells you not to drink.  If you drink alcohol: ? Limit how much you have to 0-2 drinks a day. ? Be aware of how much alcohol is in your drink. In the [place] one drink equals one 12 oz bottle of beer (355 mL), one 5 oz glass of wine (148 mL), or   one 1 oz glass of hard  liquor (44 mL). General instructions  Schedule regular health, dental, and eye exams.  Stay current with your vaccines.  Tell your health care provider if: ? You often feel depressed. ? You have ever been abused or do not feel safe at home. Summary  Adopting a healthy lifestyle and getting preventive care are important in promoting health and wellness.  Follow your health care provider's instructions about healthy diet, exercising, and getting tested or screened for diseases.  Follow your health care provider's instructions on monitoring your cholesterol and blood pressure. This information is not intended to replace advice given to you by your health care provider. Make sure you discuss any questions you have with your health care provider. Document Revised: 11/27/2018 Document Reviewed: 11/27/2018 Elsevier Patient Education  2021 [place] Maintenance Summary and Written Plan of Care  Timothy [name] ,  Thank you for allowing me to perform your Medicare Annual Wellness Visit and for your ongoing commitment to your health.   Health Maintenance & Immunization History Health Maintenance  Topic Date Due  . TETANUS/TDAP  Never done  . COVID-19 Vaccine (1) 01/08/2021 (Originally 02/16/1946)  . INFLUENZA VACCINE  03/17/2021 (Originally 07/18/2020)  . PNA vac Low Risk Adult  Completed   Immunization History  Administered Date(s) Administered  . Influenza-Unspecified 11/07/2012, 11/10/2013, 11/23/2014  . Pneumococcal Conjugate-13 11/23/2014  . Pneumococcal Polysaccharide-23 06/30/2010    These are the patient goals that we discussed: Goals Addressed              This Visit's Progress   .  Patient Stated (pt-stated)        01/07/2021 AWV Goal: Exercise for General Health   Patient will verbalize understanding of the benefits of increased physical activity:  Exercising regularly is important. It will improve your overall fitness,  flexibility, and endurance.  Regular exercise also will improve your overall health. It can help you control your weight, reduce stress, and improve your bone density.  Over the next year, patient will increase physical activity as tolerated with a goal of at least 150 minutes of moderate physical activity per week.   You can tell that you are exercising at a moderate intensity if your heart starts beating faster and you start breathing faster but can still hold a conversation.  Moderate-intensity exercise ideas include:  Walking 1 mile (1.6 km) in about 15 minutes  Biking  Hiking  Golfing  Dancing  Water aerobics  Patient will verbalize understanding of everyday activities that increase physical activity by providing examples like the following: ? Yard work, such as: ? Pushing a Conservation officer, nature ? Raking and bagging leaves ? Washing your car ? Pushing a stroller ? Shoveling snow ? Gardening ? Washing windows or floors  Patient will be able to explain general safety guidelines for exercising:   Before you start a new exercise program, talk with your health care provider.  Do not exercise so much that you hurt yourself, feel dizzy, or get very short of breath.  Wear comfortable clothes and wear shoes with good support.  Drink plenty of water while you exercise to prevent dehydration or heat stroke.  Work out until your breathing and your heartbeat get faster.         This is a list of Health Maintenance Items that are overdue or due now: Health Maintenance Due  Topic Date Due  . TETANUS/TDAP  Never done  Shingles, Covid vaccine, Flu shot  Orders/Referrals Placed Today: None  Follow-up Plan . Follow-up with Emeterio Reeve, DO as planned . Schedule your shingles vaccine, Covid vaccine, tetanus shot and flu shot.

## 2021-01-07 NOTE — Progress Notes (Signed)
MEDICARE ANNUAL WELLNESS VISIT  01/07/2021  Telephone Visit Disclaimer This Medicare AWV was conducted by telephone due to national recommendations for restrictions regarding the COVID-19 Pandemic (e.g. social distancing).  I verified, using two identifiers, that I am speaking with Timothy [name] or their authorized healthcare agent. I discussed the limitations, risks, security, and privacy concerns of performing an evaluation and management service by telephone and the potential availability of an in-person appointment in the future. The patient expressed understanding and agreed to proceed.  Location of Patient: Home with his wife, who helped with the visit today. Location of Provider (nurse):  In the office  Subjective:    Timothy [name] is a 80 y.o. male patient of Sunnie Nielsen, DO who had a Medicare Annual Wellness Visit today via telephone. Timothy [name] is Retired and lives with their spouse and mother in Social [place]. he has 7 children. he reports that he is socially active and does interact with friends/family regularly. he is minimally physically active and enjoys reading.  Patient Care Team: Sunnie Nielsen, DO as PCP - General (Osteopathic Medicine)  Advanced Directives 01/07/2021 11/27/2019 11/24/2019 09/07/2015  Does Patient Have a Medical Advance Directive? No No No No  Would patient like information on creating a medical advance directive? No - Patient declined No - Patient declined No - Patient declined -    Hospital Utilization Over the Past 12 Months: # of hospitalizations or ER visits: 3 # of surgeries: 1  Review of Systems    Patient reports that his overall health is worse compared to last year.  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 If you have any questions, ask your nurse or doctor.        AIRBORNE PO Take 1 tablet by mouth at bedtime.   albuterol 108 (90 Base) MCG/ACT inhaler Commonly known as: Ventolin HFA Inhale 1-2 puffs into the lungs every 6 (six) hours as needed for wheezing or shortness of breath.   apixaban 5 MG Tabs tablet Commonly known as: Eliquis Take 1 tablet (5 mg total) by mouth 2 (two) times daily.   Calcium Carbonate-Vitamin D 600-400 MG-UNIT chew tablet Chew 2 tablets by mouth daily.   fluticasone 50 MCG/ACT nasal spray Commonly known as: Flonase One spray in each nostril twice a day, use left hand for right nostril, and right hand for left nostril.   melatonin 1 MG Tabs tablet Take 5 mg by mouth at bedtime.   metoprolol succinate 25 MG 24 hr tablet Commonly known as: TOPROL-XL TAKE ONE-HALF (1/2) TABLET DAILY   montelukast 10 MG tablet Commonly known as: SINGULAIR TAKE 1 TABLET DAILY   telmisartan 40 MG tablet Commonly known as: MICARDIS Take 20 mg by mouth daily.   telmisartan 20 MG tablet Commonly known as: MICARDIS Take 20 mg by mouth daily.   Turmeric 500 MG Tabs Take 1,000 mg by mouth daily.   UNABLE TO FIND Med Name: sonavel for tendonitis   UNABLE TO FIND once. Med Name: Eye vitamin, pressure vision (2  tabs daily)   VITAMIN B 12 PO Take by mouth. 2500 mcg two  sublingual tabs       History (reviewed): Past Medical History:  Diagnosis Date  . Arthritis 09/08/2015  . CAD (coronary artery disease) 09/07/2015   S/P RCA stent x2, Hx MI, (+)ACEI, Statin, BB, ASA - 10/2012   . Essential hypertension 09/07/2015  . History of kidney stones 09/20/2015   11/2013 65mm stone  . Hyperlipidemia 09/07/2015  . Myocardial infarction ([place])    11/2011, s/p RCA stent  .  Osteopenia 10/29/2015   FRAX Score: 10 year risk of major osteoporotic fracture 8.9%, hip fracture 3.2%, prescription treatment is indicated, called in VitD/Ca, +/- bisphosphanate   . PAF (paroxysmal atrial fibrillation) ([place])   . Screening for AAA (aortic abdominal aneurysm) 09/20/2015   Neg 06/11/15 per record review  . Seasonal allergies 09/07/2015   Past Surgical History:  Procedure Laterality Date  . CARDIAC SURGERY Bilateral 2013   2 stints 1- left side  & 1 right side  . CORONARY ANGIOPLASTY     RCA stent 11/2011; DES LAD 01/2012 [facility] state)  . DECORTICATION Left 11/26/2019   Procedure: DECORTICATION;  Surgeon: [name], MD;  Location: [place];  Service: Thoracic;  Laterality: Left;  . PLEURAL EFFUSION DRAINAGE Left 11/26/2019   Procedure: DRAINAGE OF PLEURAL EFFUSION;  Surgeon: [name], MD;  Location: [place];  Service: Thoracic;  Laterality: Left;  Marland Kitchen VIDEO ASSISTED THORACOSCOPY Left 11/26/2019   Procedure: VIDEO ASSISTED THORACOSCOPY;  Surgeon: [name], MD;  Location: [place];  Service: Thoracic;  Laterality: Left;  Marland Kitchen VIDEO BRONCHOSCOPY N/A 11/26/2019   Procedure: VIDEO BRONCHOSCOPY;  Surgeon: [name], MD;  Location: [facility] OR;  Service: Thoracic;  Laterality: N/A;   Family History  Problem Relation Age of Onset  . Heart failure Mother   . Cancer Father    Social History   Socioeconomic History  . Marital status: Married    Spouse name: Gae Bon  . Number of children: 7  . Years of education: 34  . Highest education level: Some college, no degree  Occupational History  . Occupation: Retired.  Tobacco Use  . Smoking status: Former Smoker    Packs/day: 1.00    Years: 50.00    Pack years: 50.00    Types: Cigarettes    Start date: 11/16/2012  . Smokeless tobacco: Never Used  . Tobacco comment: Encouraged to remain smoke free  Substance and Sexual Activity  . Alcohol use: No    Alcohol/week: 0.0 standard drinks  . Drug use:  No  . Sexual activity: Not Currently  Other Topics Concern  . Not on file  Social History Narrative   Lives with wife and mother in law. Enjoys reading and using the computer. Has 4 pets.    Social Determinants of Health   Financial Resource Strain: Low Risk   . Difficulty of Paying Living Expenses: Not hard at all  Food Insecurity: No Food Insecurity  . Worried About Charity fundraiser in the Last Year: Never true  . Ran Out of Food in the Last Year: Never true  Transportation Needs: No Transportation Needs  . Lack of Transportation (Medical): No  . Lack of Transportation (Non-Medical): No  Physical Activity: Inactive  . Days of Exercise per Week: 0 days  . Minutes of Exercise per Session: 0 min  Stress: No Stress Concern Present  . Feeling of Stress : Only a little  Social Connections:  Moderately Isolated  . Frequency of Communication with Friends and Family: More than three times a week  . Frequency of Social Gatherings with Friends and Family: More than three times a week  . Attends Religious Services: Never  . Active Member of Clubs or Organizations: No  . Attends Archivist Meetings: Never  . Marital Status: Married    Activities of Daily Living In your present state of health, do you have any difficulty performing the following activities: 01/07/2021  Hearing? 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  Vision? N  Difficulty concentrating or making decisions? N  Walking or climbing stairs? Y  Comment since covid and pneumonia he is not able to do that without assistance.  Dressing or bathing? Y  Comment since covid and pnuemonia, he needs assistance  Doing errands, shopping? Y  Comment since covid and pnuemonia, he is not able to do that without assistance.  Preparing Food and eating ? Y  Comment his wife prepares food for him.  Using the Toilet? N  Comment bedside commode without assistance.  In the past six months, have you accidently leaked  urine? Y  Comment sometimes.  Do you have problems with loss of bowel control? N  Managing your Medications? N  Managing your Finances? N  Housekeeping or managing your Housekeeping? Y  Comment unable to do that without getting short of breath.  Some recent data might be hidden    Patient Education/ Literacy How often do you need to have someone help you when you read instructions, pamphlets, or other written materials from your doctor or pharmacy?: 1 - Never What is the last grade level you completed in school?: 12  Exercise Current Exercise Habits: The patient does not participate in regular exercise at present, Exercise limited by: None identified  Diet Patient reports consuming 2 meals a day and 3 snack(s) a day Patient reports that his primary diet is: Regular Patient reports that she does have regular access to food.   Depression Screen PHQ 2/9 Scores 01/07/2021 02/06/2019 01/16/2018 12/06/2016 12/07/2015  PHQ - 2 Score 0 0 0 0 0  PHQ- 9 Score 0 - - 0 -     Fall Risk Fall Risk  01/07/2021 07/21/2019 04/16/2019 11/05/2018 07/18/2018  Falls in the past year? 1 1 - 0 No  Comment - Emmi Telephone Survey: data to providers prior to load - Emmi Telephone Survey: data to providers prior to load Emmi Telephone Survey: data to providers prior to load  Number falls in past yr: 1 1 - - -  Comment - Emmi Telephone Survey Actual Response = 2 - - -  Injury with Fall? 0 0 - - -  Risk for fall due to : History of fall(s);Impaired mobility;Impaired vision - Medication side effect - -  Follow up Falls evaluation completed;Education provided;Falls prevention discussed - Falls evaluation completed - -     Objective:  Timothy [name] seemed alert and oriented and he participated appropriately during our telephone visit.  Blood Pressure Weight BMI  BP Readings from Last 3 Encounters:  12/23/20 112/66  11/17/20 120/74  11/10/20 116/64   Wt Readings from Last 3 Encounters:  12/23/20 225 lb (102.1  kg)  11/17/20 229 lb (103.9 kg)  11/10/20 219 lb 12.8 oz (99.7 kg)   BMI Readings from Last 1 Encounters:  12/23/20 30.52 kg/m    *Unable to obtain current vital [name], weight, and BMI due to telephone visit type  Hearing/Vision  .  Timothy [name] did  seem to have difficulty with hearing/understanding during the telephone conversation . Reports that he has had a formal eye exam by an eye care professional within the past year. His last one was in 2020 and he plans to make another appointment soon. Reports that he has not had a formal hearing evaluation within the past year. His last one was in 2020 and he plans to make another appointment soon.  *Unable to fully assess hearing and vision during telephone visit type  Cognitive Function: 6CIT Screen 01/07/2021  What Year? 0 points  What month? 0 points  What time? 0 points  Count back from 20 0 points  Months in reverse 0 points  Repeat phrase 0 points  Total Score 0   (Normal:0-7, Significant for Dysfunction: >8)  Normal Cognitive Function Screening: Yes   Immunization & Health Maintenance Record Immunization History  Administered Date(s) Administered  . Influenza-Unspecified 11/07/2012, 11/10/2013, 11/23/2014  . Pneumococcal Conjugate-13 11/23/2014  . Pneumococcal Polysaccharide-23 06/30/2010    Health Maintenance  Topic Date Due  . TETANUS/TDAP  Never done  . COVID-19 Vaccine (1) 01/08/2021 (Originally 02/16/1946)  . INFLUENZA VACCINE  03/17/2021 (Originally 07/18/2020)  . PNA vac Low Risk Adult  Completed       Assessment  This is a routine wellness examination for Timothy [name].  Health Maintenance: Due or Overdue Health Maintenance Due  Topic Date Due  . TETANUS/TDAP  Never done    Timothy [name] does not need a referral for Community Assistance: Care Management:   no Social Work:    no Prescription Assistance:  no Nutrition/Diabetes Education:  no   Plan:  Personalized Goals Goals Addressed              This  Visit's Progress   .  Patient Stated (pt-stated)        01/07/2021 AWV Goal: Exercise for General Health   Patient will verbalize understanding of the benefits of increased physical activity:  Exercising regularly is important. It will improve your overall fitness, flexibility, and endurance.  Regular exercise also will improve your overall health. It can help you control your weight, reduce stress, and improve your bone density.  Over the next year, patient will increase physical activity as tolerated with a goal of at least 150 minutes of moderate physical activity per week.   You can tell that you are exercising at a moderate intensity if your heart starts beating faster and you start breathing faster but can still hold a conversation.  Moderate-intensity exercise ideas include:  Walking 1 mile (1.6 km) in about 15 minutes  Biking  Hiking  Golfing  Dancing  Water aerobics  Patient will verbalize understanding of everyday activities that increase physical activity by providing examples like the following: ? Yard work, such as: ? Pushing a Conservation officer, nature ? Raking and bagging leaves ? Washing your car ? Pushing a stroller ? Shoveling snow ? Gardening ? Washing windows or floors  Patient will be able to explain general safety guidelines for exercising:   Before you start a new exercise program, talk with your health care provider.  Do not exercise so much that you hurt yourself, feel dizzy, or get very short of breath.  Wear comfortable clothes and wear shoes with good support.  Drink plenty of water while you exercise to prevent dehydration or heat stroke.  Work out until your breathing and your heartbeat get faster.       Personalized Health Maintenance & Screening Recommendations  Td vaccine Shingles vaccine, Covid vaccine and Flu shot  Lung Cancer Screening Recommended: no (Low Dose CT Chest recommended if Age 45-80 years, 30 pack-year currently smoking OR  have quit w/in past 15 years) Hepatitis C Screening recommended: yes HIV Screening recommended: yes  Advanced Directives: Written information was not prepared per patient's request.  Referrals & Orders No orders of the defined types were placed in this encounter.   Follow-up Plan . Follow-up with Emeterio Reeve, DO as planned . Schedule your shingles vaccine, Covid vaccine, tetanus shot and flu shot.    I have personally reviewed and noted the following in the patient's chart:   . Medical and social history . Use of alcohol, tobacco or illicit drugs  . Current medications and supplements . Functional ability and status . Nutritional status . Physical activity . Advanced directives . List of other physicians . Hospitalizations, surgeries, and ER visits in previous 12 months . Vitals . Screenings to include cognitive, depression, and falls . Referrals and appointments  In addition, I have reviewed and discussed with Timothy [name] certain preventive protocols, quality metrics, and best practice recommendations. A written personalized care plan for preventive services as well as general preventive health recommendations is available and can be mailed to the patient at his request.      Tinnie Gens, RN  01/07/2021

## 2021-01-11 DIAGNOSIS — U071 COVID-19: Secondary | ICD-10-CM | POA: Diagnosis not present

## 2021-01-11 DIAGNOSIS — J9601 Acute respiratory failure with hypoxia: Secondary | ICD-10-CM | POA: Diagnosis not present

## 2021-01-11 DIAGNOSIS — I129 Hypertensive chronic kidney disease with stage 1 through stage 4 chronic kidney disease, or unspecified chronic kidney disease: Secondary | ICD-10-CM | POA: Diagnosis not present

## 2021-01-11 DIAGNOSIS — D6869 Other thrombophilia: Secondary | ICD-10-CM | POA: Diagnosis not present

## 2021-01-11 DIAGNOSIS — J1282 Pneumonia due to coronavirus disease 2019: Secondary | ICD-10-CM | POA: Diagnosis not present

## 2021-01-11 DIAGNOSIS — N1832 Chronic kidney disease, stage 3b: Secondary | ICD-10-CM | POA: Diagnosis not present

## 2021-01-13 DIAGNOSIS — I129 Hypertensive chronic kidney disease with stage 1 through stage 4 chronic kidney disease, or unspecified chronic kidney disease: Secondary | ICD-10-CM | POA: Diagnosis not present

## 2021-01-13 DIAGNOSIS — J9601 Acute respiratory failure with hypoxia: Secondary | ICD-10-CM | POA: Diagnosis not present

## 2021-01-13 DIAGNOSIS — U071 COVID-19: Secondary | ICD-10-CM | POA: Diagnosis not present

## 2021-01-13 DIAGNOSIS — D6869 Other thrombophilia: Secondary | ICD-10-CM | POA: Diagnosis not present

## 2021-01-13 DIAGNOSIS — J1282 Pneumonia due to coronavirus disease 2019: Secondary | ICD-10-CM | POA: Diagnosis not present

## 2021-01-13 DIAGNOSIS — N1832 Chronic kidney disease, stage 3b: Secondary | ICD-10-CM | POA: Diagnosis not present

## 2021-01-17 ENCOUNTER — Ambulatory Visit (INDEPENDENT_AMBULATORY_CARE_PROVIDER_SITE_OTHER): Payer: Medicare Other

## 2021-01-17 ENCOUNTER — Other Ambulatory Visit: Payer: Self-pay

## 2021-01-17 ENCOUNTER — Other Ambulatory Visit (INDEPENDENT_AMBULATORY_CARE_PROVIDER_SITE_OTHER): Payer: Medicare Other

## 2021-01-17 ENCOUNTER — Encounter: Payer: Self-pay | Admitting: Osteopathic Medicine

## 2021-01-17 ENCOUNTER — Ambulatory Visit (INDEPENDENT_AMBULATORY_CARE_PROVIDER_SITE_OTHER): Payer: Medicare Other | Admitting: Osteopathic Medicine

## 2021-01-17 VITALS — BP 144/77 | HR 84 | Wt 232.0 lb

## 2021-01-17 DIAGNOSIS — N201 Calculus of ureter: Secondary | ICD-10-CM

## 2021-01-17 DIAGNOSIS — N202 Calculus of kidney with calculus of ureter: Secondary | ICD-10-CM | POA: Diagnosis not present

## 2021-01-17 DIAGNOSIS — N1330 Unspecified hydronephrosis: Secondary | ICD-10-CM | POA: Diagnosis not present

## 2021-01-17 DIAGNOSIS — R310 Gross hematuria: Secondary | ICD-10-CM

## 2021-01-17 DIAGNOSIS — N132 Hydronephrosis with renal and ureteral calculous obstruction: Secondary | ICD-10-CM | POA: Diagnosis not present

## 2021-01-17 DIAGNOSIS — I700 Atherosclerosis of aorta: Secondary | ICD-10-CM | POA: Diagnosis not present

## 2021-01-17 LAB — POCT URINALYSIS DIP (CLINITEK)
Bilirubin, UA: NEGATIVE
Glucose, UA: NEGATIVE mg/dL
Ketones, POC UA: NEGATIVE mg/dL
Nitrite, UA: NEGATIVE
POC PROTEIN,UA: NEGATIVE
Spec Grav, UA: 1.025 (ref 1.010–1.025)
Urobilinogen, UA: 1 E.U./dL
pH, UA: 6.5 (ref 5.0–8.0)

## 2021-01-17 MED ORDER — IOHEXOL 300 MG/ML  SOLN
125.0000 mL | Freq: Once | INTRAMUSCULAR | Status: AC | PRN
  Administered 2021-01-17: 125 mL via INTRAVENOUS

## 2021-01-17 NOTE — Progress Notes (Signed)
Patient's wife brought urine sample in sterile cup to office for testing. States husband has been experiencing blood in urine x 4 weeks (at least). Not experiencing pain. Will not come to office for visit.   Patient has been scheduled for MyChart visit. Spouse is aware and attempting to convince patient to have office visit. Office contacting patient as well.

## 2021-01-17 NOTE — Patient Instructions (Signed)
PLAN: CT scan today  Labs today including urine and blood testing  Depending on results, may need specialist referral

## 2021-01-17 NOTE — Progress Notes (Signed)
HPI: Timothy [name] is a 80 y.o. male who  has a past medical history of Arthritis (09/08/2015), CAD (coronary artery disease) (09/07/2015), Essential hypertension (09/07/2015), History of kidney stones (09/20/2015), Hyperlipidemia (09/07/2015), Myocardial infarction [facility]), Osteopenia (10/29/2015), PAF (paroxysmal atrial fibrillation) ([place]), Screening for AAA (aortic abdominal aneurysm) (09/20/2015), and Seasonal allergies (09/07/2015).  he presents to [facility] today, 01/17/21,  for chief complaint of:  Gorss hematuria, nonpainful, no large clots but some sediment, no fever/chils, no urinary frequency, ongoing about a month total. Hx smoker.       ASSESSMENT/PLAN: The primary encounter diagnosis was Gross hematuria. Diagnoses of Hydronephrosis of left kidney and Ureterolithiasis were also pertinent to this visit.  Patient Instructions  PLAN: CT scan today  Labs today including urine and blood testing  Depending on results, may need specialist referral    CT ABDOMEN PELVIS W WO CONTRAST  Result Date: 01/17/2021 CLINICAL DATA:  Gross hematuria for 3 weeks EXAM: CT ABDOMEN AND PELVIS WITHOUT AND WITH CONTRAST TECHNIQUE: Multidetector CT imaging of the abdomen and pelvis was performed following the standard protocol before and following the bolus administration of intravenous contrast. CONTRAST:  172mL OMNIPAQUE IOHEXOL 300 MG/ML  SOLN COMPARISON:  None. FINDINGS: Lower chest: No acute findings. Chronic fibrotic lung disease is identified within both lung bases including peripheral interstitial reticulation, ground-glass attenuation and traction bronchiectasis. Hepatobiliary: No focal liver abnormality is seen. No gallstones, gallbladder wall thickening, or biliary dilatation. Pancreas: Unremarkable. No pancreatic ductal dilatation or surrounding inflammatory changes. Spleen: Normal in size without focal abnormality. Adrenals/Urinary Tract: The adrenal glands are  unremarkable. Bilateral nephrolithiasis: -Within the mid right kidney there is a large stone measuring 8 mm, image 61/4. -There are 2 stones within the inferior pole collecting system of the left kidney, which measure up to 8 mm, image 61/4. -Left-sided hydronephrosis and proximal hydroureter secondary to 2 stacked stones within the proximal left ureter measuring 0.6 x 0.5 cm, image 56/4. No additional urinary tract calculi identified. No kidney mass identified bilaterally. Urinary bladder is unremarkable. Stomach/Bowel: Stomach appears nondistended. No dilated loops of large or small bowel. No signs of bowel wall thickening or inflammation. Vascular/Lymphatic: Aortic atherosclerosis. No aneurysm identified. No abdominopelvic adenopathy. Reproductive: Prostate is unremarkable. Other: No free fluid or fluid collections identified. Musculoskeletal: Multilevel degenerative disc disease identified within the visualized portions of the thoracolumbar spine. No acute or suspicious osseous findings. IMPRESSION: 1. Left-sided hydronephrosis and proximal hydroureter secondary to 2 stacked stones within the proximal left ureter measuring 0.6 x 0.5 cm. 2. Bilateral nephrolithiasis. 3. Chronic fibrotic lung disease. Aortic Atherosclerosis (ICD10-I70.0). Electronically Signed   By: Kerby Moors M.D.   On: 01/17/2021 13:15     Orders Placed This Encounter  Procedures  . CT ABDOMEN PELVIS W WO CONTRAST  . COMPLETE METABOLIC PANEL WITH GFR  . CBC  . Microalbumin / creatinine urine ratio  . Urine Microscopic  . Ambulatory referral to Urology     No orders of the defined types were placed in this encounter.      Follow-up plan: Return for RECHECK PENDING RESULTS / IF WORSE OR  CHANGE.                                                 ################################################# ################################################# ################################################# #################################################    Current Meds  Medication Sig  .  albuterol (VENTOLIN HFA) 108 (90 Base) MCG/ACT inhaler Inhale 1-2 puffs into the lungs every 6 (six) hours as needed for wheezing or shortness of breath.  Marland Kitchen apixaban (ELIQUIS) 5 MG TABS tablet Take 1 tablet (5 mg total) by mouth 2 (two) times daily.  . Calcium Carbonate-Vitamin D 600-400 MG-UNIT chew tablet Chew 2 tablets by mouth daily.  . Cyanocobalamin (VITAMIN B 12 PO) Take by mouth. 2500 mcg two  sublingual tabs  . fluticasone (FLONASE) 50 MCG/ACT nasal spray One spray in each nostril twice a day, use left hand for right nostril, and right hand for left nostril.  . melatonin 1 MG TABS tablet Take 5 mg by mouth at bedtime.  . metoprolol succinate (TOPROL-XL) 25 MG 24 hr tablet TAKE ONE-HALF (1/2) TABLET DAILY  . montelukast (SINGULAIR) 10 MG tablet TAKE 1 TABLET DAILY  . Multiple Vitamins-Minerals (AIRBORNE PO) Take 1 tablet by mouth at bedtime.   Marland Kitchen telmisartan (MICARDIS) 20 MG tablet Take 20 mg by mouth daily.  Marland Kitchen telmisartan (MICARDIS) 40 MG tablet Take 20 mg by mouth daily.  . Turmeric 500 MG TABS Take 1,000 mg by mouth daily.   Marland Kitchen UNABLE TO FIND Med Name: sonavel for tendonitis  . UNABLE TO FIND once. Med Name: Eye vitamin, pressure vision (2 tabs daily)    No Known Allergies     Review of Systems: Pertinent (+) and (-) ROS in HPI as above   Exam:  BP (!) 144/77   Pulse 84   Wt 232 lb 0.6 oz (105.3 kg)   SpO2 96%   BMI 31.47 kg/m   Constitutional: VS see above. General Appearance: alert, well-developed, well-nourished, NAD  Neck: No masses, trachea midline.   Respiratory: Normal respiratory effor  Cardiovascular: S1/S2 normal, RRR    Abdominal: non-tender, non-distended, no appreciable organomegaly  Neurological: Normal balance/coordination. No tremor.  Skin: warm, dry, intact.   Psychiatric: Normal judgment/insight. Normal mood and affect. Oriented x3.       Visit summary with medication list and pertinent instructions was printed for patient to review, patient was advised to alert [place] if any updates are needed. All questions at time of visit were answered - patient instructed to contact office with any additional concerns. ER/RTC precautions were reviewed with the patient and understanding verbalized.   Please note: voice recognition software was used to produce this document, and typos may escape review. Please contact Dr. Sheppard Coil for any needed clarifications.    Follow up plan: Return for RECHECK PENDING RESULTS / IF WORSE OR CHANGE.

## 2021-01-18 ENCOUNTER — Telehealth: Payer: Medicare Other | Admitting: Osteopathic Medicine

## 2021-01-18 DIAGNOSIS — J9601 Acute respiratory failure with hypoxia: Secondary | ICD-10-CM | POA: Diagnosis not present

## 2021-01-18 DIAGNOSIS — D6869 Other thrombophilia: Secondary | ICD-10-CM | POA: Diagnosis not present

## 2021-01-18 DIAGNOSIS — J1282 Pneumonia due to coronavirus disease 2019: Secondary | ICD-10-CM | POA: Diagnosis not present

## 2021-01-18 DIAGNOSIS — U071 COVID-19: Secondary | ICD-10-CM | POA: Diagnosis not present

## 2021-01-18 DIAGNOSIS — N1832 Chronic kidney disease, stage 3b: Secondary | ICD-10-CM | POA: Diagnosis not present

## 2021-01-18 DIAGNOSIS — I129 Hypertensive chronic kidney disease with stage 1 through stage 4 chronic kidney disease, or unspecified chronic kidney disease: Secondary | ICD-10-CM | POA: Diagnosis not present

## 2021-01-18 LAB — CBC
HCT: 39.6 % (ref 38.5–50.0)
Hemoglobin: 13.2 g/dL (ref 13.2–17.1)
MCH: 30.8 pg (ref 27.0–33.0)
MCHC: 33.3 g/dL (ref 32.0–36.0)
MCV: 92.5 fL (ref 80.0–100.0)
MPV: 10.8 fL (ref 7.5–12.5)
Platelets: 273 10*3/uL (ref 140–400)
RBC: 4.28 10*6/uL (ref 4.20–5.80)
RDW: 13.3 % (ref 11.0–15.0)
WBC: 9.6 10*3/uL (ref 3.8–10.8)

## 2021-01-18 LAB — COMPLETE METABOLIC PANEL WITH GFR
AG Ratio: 1.6 (calc) (ref 1.0–2.5)
ALT: 12 U/L (ref 9–46)
AST: 17 U/L (ref 10–35)
Albumin: 4 g/dL (ref 3.6–5.1)
Alkaline phosphatase (APISO): 78 U/L (ref 35–144)
BUN: 18 mg/dL (ref 7–25)
CO2: 27 mmol/L (ref 20–32)
Calcium: 10 mg/dL (ref 8.6–10.3)
Chloride: 107 mmol/L (ref 98–110)
Creat: 1.1 mg/dL (ref 0.70–1.11)
GFR, Est African American: 73 mL/min/{1.73_m2} (ref >=60)
GFR, Est Non African American: 63 mL/min/{1.73_m2} (ref >=60)
Globulin: 2.5 g/dL (calc) (ref 1.9–3.7)
Glucose, Bld: 89 mg/dL (ref 65–139)
Potassium: 5.1 mmol/L (ref 3.5–5.3)
Sodium: 143 mmol/L (ref 135–146)
Total Bilirubin: 0.4 mg/dL (ref 0.2–1.2)
Total Protein: 6.5 g/dL (ref 6.1–8.1)

## 2021-01-19 LAB — URINE CULTURE
MICRO NUMBER:: 11479032
Result:: NO GROWTH
SPECIMEN QUALITY:: ADEQUATE

## 2021-01-20 DIAGNOSIS — R310 Gross hematuria: Secondary | ICD-10-CM | POA: Diagnosis not present

## 2021-01-20 DIAGNOSIS — N202 Calculus of kidney with calculus of ureter: Secondary | ICD-10-CM | POA: Diagnosis not present

## 2021-02-07 DIAGNOSIS — I63511 Cerebral infarction due to unspecified occlusion or stenosis of right middle cerebral artery: Secondary | ICD-10-CM | POA: Diagnosis not present

## 2021-02-07 DIAGNOSIS — J438 Other emphysema: Secondary | ICD-10-CM | POA: Diagnosis not present

## 2021-02-07 DIAGNOSIS — Z7901 Long term (current) use of anticoagulants: Secondary | ICD-10-CM | POA: Diagnosis not present

## 2021-02-07 DIAGNOSIS — R0902 Hypoxemia: Secondary | ICD-10-CM | POA: Diagnosis not present

## 2021-02-07 DIAGNOSIS — I517 Cardiomegaly: Secondary | ICD-10-CM | POA: Diagnosis not present

## 2021-02-07 DIAGNOSIS — R471 Dysarthria and anarthria: Secondary | ICD-10-CM | POA: Diagnosis present

## 2021-02-07 DIAGNOSIS — I6503 Occlusion and stenosis of bilateral vertebral arteries: Secondary | ICD-10-CM | POA: Diagnosis not present

## 2021-02-07 DIAGNOSIS — I480 Paroxysmal atrial fibrillation: Secondary | ICD-10-CM | POA: Diagnosis not present

## 2021-02-07 DIAGNOSIS — I63231 Cerebral infarction due to unspecified occlusion or stenosis of right carotid arteries: Secondary | ICD-10-CM | POA: Diagnosis not present

## 2021-02-07 DIAGNOSIS — R4781 Slurred speech: Secondary | ICD-10-CM | POA: Diagnosis not present

## 2021-02-07 DIAGNOSIS — G4489 Other headache syndrome: Secondary | ICD-10-CM | POA: Diagnosis not present

## 2021-02-07 DIAGNOSIS — R918 Other nonspecific abnormal finding of lung field: Secondary | ICD-10-CM | POA: Diagnosis not present

## 2021-02-07 DIAGNOSIS — I6601 Occlusion and stenosis of right middle cerebral artery: Secondary | ICD-10-CM | POA: Diagnosis not present

## 2021-02-07 DIAGNOSIS — R29810 Facial weakness: Secondary | ICD-10-CM | POA: Diagnosis not present

## 2021-02-07 DIAGNOSIS — Z20822 Contact with and (suspected) exposure to covid-19: Secondary | ICD-10-CM | POA: Diagnosis present

## 2021-02-07 DIAGNOSIS — I6521 Occlusion and stenosis of right carotid artery: Secondary | ICD-10-CM | POA: Diagnosis not present

## 2021-02-07 DIAGNOSIS — I482 Chronic atrial fibrillation, unspecified: Secondary | ICD-10-CM | POA: Diagnosis not present

## 2021-02-07 DIAGNOSIS — Z79899 Other long term (current) drug therapy: Secondary | ICD-10-CM | POA: Diagnosis not present

## 2021-02-07 DIAGNOSIS — R29703 NIHSS score 3: Secondary | ICD-10-CM | POA: Diagnosis present

## 2021-02-07 DIAGNOSIS — R4182 Altered mental status, unspecified: Secondary | ICD-10-CM | POA: Diagnosis not present

## 2021-02-07 DIAGNOSIS — I6340 Cerebral infarction due to embolism of unspecified cerebral artery: Secondary | ICD-10-CM | POA: Diagnosis not present

## 2021-02-07 DIAGNOSIS — G459 Transient cerebral ischemic attack, unspecified: Secondary | ICD-10-CM | POA: Diagnosis not present

## 2021-02-07 DIAGNOSIS — R29818 Other symptoms and signs involving the nervous system: Secondary | ICD-10-CM | POA: Diagnosis not present

## 2021-02-07 DIAGNOSIS — I10 Essential (primary) hypertension: Secondary | ICD-10-CM | POA: Diagnosis not present

## 2021-02-07 DIAGNOSIS — I2510 Atherosclerotic heart disease of native coronary artery without angina pectoris: Secondary | ICD-10-CM | POA: Diagnosis not present

## 2021-02-07 DIAGNOSIS — Z8616 Personal history of COVID-19: Secondary | ICD-10-CM | POA: Diagnosis not present

## 2021-02-07 DIAGNOSIS — I252 Old myocardial infarction: Secondary | ICD-10-CM | POA: Diagnosis not present

## 2021-02-07 DIAGNOSIS — I639 Cerebral infarction, unspecified: Secondary | ICD-10-CM | POA: Diagnosis not present

## 2021-02-07 DIAGNOSIS — I129 Hypertensive chronic kidney disease with stage 1 through stage 4 chronic kidney disease, or unspecified chronic kidney disease: Secondary | ICD-10-CM | POA: Diagnosis present

## 2021-02-07 DIAGNOSIS — G8194 Hemiplegia, unspecified affecting left nondominant side: Secondary | ICD-10-CM | POA: Diagnosis present

## 2021-02-07 DIAGNOSIS — I080 Rheumatic disorders of both mitral and aortic valves: Secondary | ICD-10-CM | POA: Diagnosis not present

## 2021-02-07 DIAGNOSIS — Z87891 Personal history of nicotine dependence: Secondary | ICD-10-CM | POA: Diagnosis not present

## 2021-02-07 DIAGNOSIS — I6523 Occlusion and stenosis of bilateral carotid arteries: Secondary | ICD-10-CM | POA: Diagnosis not present

## 2021-02-07 DIAGNOSIS — N3090 Cystitis, unspecified without hematuria: Secondary | ICD-10-CM | POA: Diagnosis not present

## 2021-02-07 DIAGNOSIS — N1832 Chronic kidney disease, stage 3b: Secondary | ICD-10-CM | POA: Diagnosis not present

## 2021-02-09 ENCOUNTER — Telehealth: Payer: Self-pay

## 2021-02-09 NOTE — Telephone Encounter (Signed)
Pt's wife called stating that pt was in rehab care. 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 She wanted provider to be aware in case the facility sent any paperwork regarding the patient's care.

## 2021-02-14 ENCOUNTER — Other Ambulatory Visit: Payer: Self-pay | Admitting: Osteopathic Medicine

## 2021-02-15 DIAGNOSIS — I69322 Dysarthria following cerebral infarction: Secondary | ICD-10-CM | POA: Diagnosis not present

## 2021-02-15 DIAGNOSIS — N189 Chronic kidney disease, unspecified: Secondary | ICD-10-CM | POA: Diagnosis not present

## 2021-02-15 DIAGNOSIS — I69354 Hemiplegia and hemiparesis following cerebral infarction affecting left non-dominant side: Secondary | ICD-10-CM | POA: Diagnosis not present

## 2021-02-15 DIAGNOSIS — R319 Hematuria, unspecified: Secondary | ICD-10-CM | POA: Diagnosis not present

## 2021-02-15 DIAGNOSIS — R279 Unspecified lack of coordination: Secondary | ICD-10-CM | POA: Diagnosis present

## 2021-02-15 DIAGNOSIS — I4891 Unspecified atrial fibrillation: Secondary | ICD-10-CM | POA: Diagnosis not present

## 2021-02-15 DIAGNOSIS — Z7409 Other reduced mobility: Secondary | ICD-10-CM | POA: Diagnosis not present

## 2021-02-15 DIAGNOSIS — R471 Dysarthria and anarthria: Secondary | ICD-10-CM | POA: Diagnosis not present

## 2021-02-15 DIAGNOSIS — I129 Hypertensive chronic kidney disease with stage 1 through stage 4 chronic kidney disease, or unspecified chronic kidney disease: Secondary | ICD-10-CM | POA: Diagnosis not present

## 2021-02-15 DIAGNOSIS — I639 Cerebral infarction, unspecified: Secondary | ICD-10-CM | POA: Diagnosis not present

## 2021-02-15 DIAGNOSIS — I10 Essential (primary) hypertension: Secondary | ICD-10-CM | POA: Diagnosis not present

## 2021-02-15 DIAGNOSIS — I482 Chronic atrial fibrillation, unspecified: Secondary | ICD-10-CM | POA: Diagnosis not present

## 2021-02-15 DIAGNOSIS — Z7901 Long term (current) use of anticoagulants: Secondary | ICD-10-CM | POA: Diagnosis not present

## 2021-02-15 DIAGNOSIS — I252 Old myocardial infarction: Secondary | ICD-10-CM | POA: Diagnosis not present

## 2021-02-15 DIAGNOSIS — I2510 Atherosclerotic heart disease of native coronary artery without angina pectoris: Secondary | ICD-10-CM | POA: Diagnosis present

## 2021-02-15 DIAGNOSIS — R6889 Other general symptoms and signs: Secondary | ICD-10-CM | POA: Diagnosis present

## 2021-02-25 ENCOUNTER — Telehealth: Payer: Self-pay

## 2021-02-25 NOTE — Telephone Encounter (Signed)
Tanya from [facility] called requesting if provider can take over physical and occupational therapy. Pt is due to be discharge today from the hospital. Verbal order provided to proceed with at home PT/OT sessions. No other inquiries during the call.

## 2021-02-28 DIAGNOSIS — Z87440 Personal history of urinary (tract) infections: Secondary | ICD-10-CM | POA: Diagnosis not present

## 2021-02-28 DIAGNOSIS — N189 Chronic kidney disease, unspecified: Secondary | ICD-10-CM | POA: Diagnosis not present

## 2021-02-28 DIAGNOSIS — I482 Chronic atrial fibrillation, unspecified: Secondary | ICD-10-CM | POA: Diagnosis not present

## 2021-02-28 DIAGNOSIS — Z7901 Long term (current) use of anticoagulants: Secondary | ICD-10-CM | POA: Diagnosis not present

## 2021-02-28 DIAGNOSIS — I69322 Dysarthria following cerebral infarction: Secondary | ICD-10-CM | POA: Diagnosis not present

## 2021-02-28 DIAGNOSIS — I129 Hypertensive chronic kidney disease with stage 1 through stage 4 chronic kidney disease, or unspecified chronic kidney disease: Secondary | ICD-10-CM | POA: Diagnosis not present

## 2021-02-28 DIAGNOSIS — I69392 Facial weakness following cerebral infarction: Secondary | ICD-10-CM | POA: Diagnosis not present

## 2021-02-28 DIAGNOSIS — I6521 Occlusion and stenosis of right carotid artery: Secondary | ICD-10-CM | POA: Diagnosis not present

## 2021-02-28 DIAGNOSIS — I69354 Hemiplegia and hemiparesis following cerebral infarction affecting left non-dominant side: Secondary | ICD-10-CM | POA: Diagnosis not present

## 2021-02-28 DIAGNOSIS — Z8616 Personal history of COVID-19: Secondary | ICD-10-CM | POA: Diagnosis not present

## 2021-02-28 DIAGNOSIS — I2510 Atherosclerotic heart disease of native coronary artery without angina pectoris: Secondary | ICD-10-CM | POA: Diagnosis not present

## 2021-03-03 DIAGNOSIS — I6521 Occlusion and stenosis of right carotid artery: Secondary | ICD-10-CM | POA: Diagnosis not present

## 2021-03-03 DIAGNOSIS — I482 Chronic atrial fibrillation, unspecified: Secondary | ICD-10-CM | POA: Diagnosis not present

## 2021-03-03 DIAGNOSIS — I2510 Atherosclerotic heart disease of native coronary artery without angina pectoris: Secondary | ICD-10-CM | POA: Diagnosis not present

## 2021-03-03 DIAGNOSIS — I69392 Facial weakness following cerebral infarction: Secondary | ICD-10-CM | POA: Diagnosis not present

## 2021-03-03 DIAGNOSIS — I69354 Hemiplegia and hemiparesis following cerebral infarction affecting left non-dominant side: Secondary | ICD-10-CM | POA: Diagnosis not present

## 2021-03-03 DIAGNOSIS — I69322 Dysarthria following cerebral infarction: Secondary | ICD-10-CM | POA: Diagnosis not present

## 2021-03-07 DIAGNOSIS — I69322 Dysarthria following cerebral infarction: Secondary | ICD-10-CM | POA: Diagnosis not present

## 2021-03-07 DIAGNOSIS — I2510 Atherosclerotic heart disease of native coronary artery without angina pectoris: Secondary | ICD-10-CM | POA: Diagnosis not present

## 2021-03-07 DIAGNOSIS — I69392 Facial weakness following cerebral infarction: Secondary | ICD-10-CM | POA: Diagnosis not present

## 2021-03-07 DIAGNOSIS — I69354 Hemiplegia and hemiparesis following cerebral infarction affecting left non-dominant side: Secondary | ICD-10-CM | POA: Diagnosis not present

## 2021-03-07 DIAGNOSIS — I6521 Occlusion and stenosis of right carotid artery: Secondary | ICD-10-CM | POA: Diagnosis not present

## 2021-03-07 DIAGNOSIS — I482 Chronic atrial fibrillation, unspecified: Secondary | ICD-10-CM | POA: Diagnosis not present

## 2021-03-09 DIAGNOSIS — I69354 Hemiplegia and hemiparesis following cerebral infarction affecting left non-dominant side: Secondary | ICD-10-CM | POA: Diagnosis not present

## 2021-03-09 DIAGNOSIS — I69322 Dysarthria following cerebral infarction: Secondary | ICD-10-CM | POA: Diagnosis not present

## 2021-03-09 DIAGNOSIS — I6521 Occlusion and stenosis of right carotid artery: Secondary | ICD-10-CM | POA: Diagnosis not present

## 2021-03-09 DIAGNOSIS — I482 Chronic atrial fibrillation, unspecified: Secondary | ICD-10-CM | POA: Diagnosis not present

## 2021-03-09 DIAGNOSIS — I69392 Facial weakness following cerebral infarction: Secondary | ICD-10-CM | POA: Diagnosis not present

## 2021-03-09 DIAGNOSIS — I2510 Atherosclerotic heart disease of native coronary artery without angina pectoris: Secondary | ICD-10-CM | POA: Diagnosis not present

## 2021-03-10 ENCOUNTER — Inpatient Hospital Stay: Payer: Medicare Other | Admitting: Osteopathic Medicine

## 2021-03-10 DIAGNOSIS — I6521 Occlusion and stenosis of right carotid artery: Secondary | ICD-10-CM | POA: Diagnosis not present

## 2021-03-14 DIAGNOSIS — I69322 Dysarthria following cerebral infarction: Secondary | ICD-10-CM | POA: Diagnosis not present

## 2021-03-14 DIAGNOSIS — I482 Chronic atrial fibrillation, unspecified: Secondary | ICD-10-CM | POA: Diagnosis not present

## 2021-03-14 DIAGNOSIS — I6521 Occlusion and stenosis of right carotid artery: Secondary | ICD-10-CM | POA: Diagnosis not present

## 2021-03-14 DIAGNOSIS — I2510 Atherosclerotic heart disease of native coronary artery without angina pectoris: Secondary | ICD-10-CM | POA: Diagnosis not present

## 2021-03-14 DIAGNOSIS — I69392 Facial weakness following cerebral infarction: Secondary | ICD-10-CM | POA: Diagnosis not present

## 2021-03-14 DIAGNOSIS — I69354 Hemiplegia and hemiparesis following cerebral infarction affecting left non-dominant side: Secondary | ICD-10-CM | POA: Diagnosis not present

## 2021-03-15 DIAGNOSIS — I482 Chronic atrial fibrillation, unspecified: Secondary | ICD-10-CM | POA: Diagnosis not present

## 2021-03-15 DIAGNOSIS — I6521 Occlusion and stenosis of right carotid artery: Secondary | ICD-10-CM | POA: Diagnosis not present

## 2021-03-15 DIAGNOSIS — I2510 Atherosclerotic heart disease of native coronary artery without angina pectoris: Secondary | ICD-10-CM | POA: Diagnosis not present

## 2021-03-15 DIAGNOSIS — I69322 Dysarthria following cerebral infarction: Secondary | ICD-10-CM | POA: Diagnosis not present

## 2021-03-15 DIAGNOSIS — I69354 Hemiplegia and hemiparesis following cerebral infarction affecting left non-dominant side: Secondary | ICD-10-CM | POA: Diagnosis not present

## 2021-03-15 DIAGNOSIS — I69392 Facial weakness following cerebral infarction: Secondary | ICD-10-CM | POA: Diagnosis not present

## 2021-03-16 ENCOUNTER — Telehealth: Payer: Self-pay

## 2021-03-16 DIAGNOSIS — Z8673 Personal history of transient ischemic attack (TIA), and cerebral infarction without residual deficits: Secondary | ICD-10-CM | POA: Diagnosis not present

## 2021-03-16 DIAGNOSIS — I480 Paroxysmal atrial fibrillation: Secondary | ICD-10-CM | POA: Diagnosis not present

## 2021-03-16 DIAGNOSIS — I2510 Atherosclerotic heart disease of native coronary artery without angina pectoris: Secondary | ICD-10-CM | POA: Diagnosis not present

## 2021-03-16 DIAGNOSIS — I10 Essential (primary) hypertension: Secondary | ICD-10-CM | POA: Diagnosis not present

## 2021-03-16 NOTE — Telephone Encounter (Signed)
[facility] called requesting occupational therapy for pt. Per O/T, therapy will be once a week:4 wks. Verbal order given to proceed forward with home care for patient.

## 2021-03-17 ENCOUNTER — Telehealth: Payer: Self-pay | Admitting: Internal Medicine

## 2021-03-17 ENCOUNTER — Other Ambulatory Visit: Payer: Self-pay

## 2021-03-17 ENCOUNTER — Encounter: Payer: Self-pay | Admitting: Osteopathic Medicine

## 2021-03-17 ENCOUNTER — Ambulatory Visit (INDEPENDENT_AMBULATORY_CARE_PROVIDER_SITE_OTHER): Payer: Medicare Other | Admitting: Osteopathic Medicine

## 2021-03-17 VITALS — BP 99/64 | HR 91 | Temp 98.7°F | Wt 221.0 lb

## 2021-03-17 DIAGNOSIS — R310 Gross hematuria: Secondary | ICD-10-CM | POA: Diagnosis not present

## 2021-03-17 DIAGNOSIS — I69998 Other sequelae following unspecified cerebrovascular disease: Secondary | ICD-10-CM | POA: Diagnosis not present

## 2021-03-17 DIAGNOSIS — I6521 Occlusion and stenosis of right carotid artery: Secondary | ICD-10-CM | POA: Diagnosis not present

## 2021-03-17 DIAGNOSIS — N201 Calculus of ureter: Secondary | ICD-10-CM

## 2021-03-17 DIAGNOSIS — I69354 Hemiplegia and hemiparesis following cerebral infarction affecting left non-dominant side: Secondary | ICD-10-CM | POA: Diagnosis not present

## 2021-03-17 DIAGNOSIS — I69392 Facial weakness following cerebral infarction: Secondary | ICD-10-CM | POA: Diagnosis not present

## 2021-03-17 DIAGNOSIS — I482 Chronic atrial fibrillation, unspecified: Secondary | ICD-10-CM | POA: Diagnosis not present

## 2021-03-17 DIAGNOSIS — R531 Weakness: Secondary | ICD-10-CM

## 2021-03-17 DIAGNOSIS — I2510 Atherosclerotic heart disease of native coronary artery without angina pectoris: Secondary | ICD-10-CM | POA: Diagnosis not present

## 2021-03-17 DIAGNOSIS — I69322 Dysarthria following cerebral infarction: Secondary | ICD-10-CM | POA: Diagnosis not present

## 2021-03-17 NOTE — Progress Notes (Signed)
Timothy [name] is a 80 y.o. male who presents to  [name]  today, 03/17/21, seeking care for the following: Hospital follow up - CVA   Recent hospitalization for CVA, pt was prescribed ASA and was restarted on Eliquis day 7 post-CVA (was held in acute CVA), neurology to follow re: possible revascularization of R ICA. Some residual L sided motor deficits and dysarthria. Afib stable, continuing BB and Eliquis. Following w/ neurology - visit there yesterday, notes reviewed.   Wife has concerns of increased impusivity. He is known to be a stubborn person. He wants to keep driving. He states he feels fine..      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 Diagnoses of Gross hematuria and Ureterolithiasis were also pertinent to this visit.   Weakness isn't terrible on exam, based on neuro notes sounds like he is improving. His wife Timothy [name] has concerns about his behavior, moods, concerned he is depressed. Patient denies any problems here. I stressed to him he is NOT to drive until/unless neurology clears him to do so. I think he is probably trying to assert his independence and is a bit in denial about his physical limitations. My chief concern is having another CVA/TIA on the road, which makes him a liability even if he's otherwise "fine." He seems to understand this.   Labs today to monitor anemia w/ hematuria. May consider higher potency statin   Patient Instructions  NO DRIVING. If you have another stroke behind the wheel, you could kill yourself or someone else. Driving is done!      Orders Placed This Encounter  Procedures  . Urine Culture  . CBC with Differential/Platelet  . COMPLETE METABOLIC PANEL WITH GFR  . Urinalysis    No orders of the defined types were placed in this  encounter.    See below for relevant physical exam findings  See below for recent lab and imaging results reviewed  Medications, allergies, PMH, PSH, SocH, FamH reviewed below    Follow-up instructions: Return in about 6 months (around 09/16/2021) for ROUTINE CHECK-UP, SEE [place] SOONER IF NEEDED.                                        Exam:  BP 99/64 (BP Location: Left Arm, Patient Position: Sitting, Cuff Size: Normal)   Pulse 91   Temp 98.7 F (37.1 C) (Oral)   Wt 221 lb (100.2 kg)   BMI 29.97 kg/m   Constitutional: VS see above. General Appearance: alert, well-developed, well-nourished, NAD  Neck: No masses, trachea midline.   Respiratory: Normal respiratory effort. no wheeze, no rhonchi, no rales  Cardiovascular: S1/S2 normal, no murmur, no rub/gallop auscultated. RRR.   Musculoskeletal: Gait favors L side a bit but he is steady. No facial droop. Strength 4/4 in all extremities though slightly less on L compared to R w/ grip strength and hip flexion  Abdominal: non-tender, non-distended, no appreciable organomegaly, neg Murphy's, BS WNLx4  Neurological: Normal balance/coordination. No tremor.  Skin: warm, dry, intact.   Psychiatric: Normal judgment/insight. Normal mood and affect. Oriented x3.   Current Meds  Medication Sig  . albuterol (VENTOLIN HFA) 108 (90 Base) MCG/ACT inhaler Inhale 1-2 puffs into the lungs every 6 (six) hours as  needed for wheezing or shortness of breath.  . Calcium Carbonate-Vitamin D 600-400 MG-UNIT chew tablet Chew 2 tablets by mouth daily.  . Cyanocobalamin (VITAMIN B 12 PO) Take by mouth. 2500 mcg two  sublingual tabs  . ELIQUIS 5 MG TABS tablet TAKE 1 TABLET TWICE A DAY  . fluticasone (FLONASE) 50 MCG/ACT nasal spray One spray in each nostril twice a day, use left hand for right nostril, and right hand for left nostril.  . melatonin 1 MG TABS tablet Take 5 mg by mouth at bedtime.  . metoprolol succinate  (TOPROL-XL) 25 MG 24 hr tablet TAKE ONE-HALF (1/2) TABLET DAILY  . montelukast (SINGULAIR) 10 MG tablet TAKE 1 TABLET DAILY  . Multiple Vitamins-Minerals (AIRBORNE PO) Take 1 tablet by mouth at bedtime.   Marland Kitchen telmisartan (MICARDIS) 20 MG tablet Take 20 mg by mouth daily.  Marland Kitchen telmisartan (MICARDIS) 40 MG tablet Take 20 mg by mouth daily.  . Turmeric 500 MG TABS Take 1,000 mg by mouth daily.   Marland Kitchen UNABLE TO FIND Med Name: sonavel for tendonitis  . UNABLE TO FIND once. Med Name: Eye vitamin, pressure vision (2 tabs daily)    No Known Allergies  Patient Active Problem List   Diagnosis Date Noted  . Chronic respiratory failure with hypoxia ([place]) 12/24/2020  . Pneumonia due to COVID-19 virus 11/10/2020  . Acute respiratory failure due to COVID-19 ([place]) 11/10/2020  . Lumbar spondylosis 04/20/2020  . S/P thoracotomy 11/26/2019  . Pleural effusion on left 11/04/2019  . Rib pain on left side 10/20/2019  . Fever 10/20/2019  . Cough 10/20/2019  . Former smoker 10/20/2019  . Atrial fibrillation ([place]) 04/16/2019  . Senile purpura ([place]) 04/16/2019  . Encounter for physical examination of prospective foster parent 02/06/2019  . Coronary atherosclerosis 01/31/2017  . Osteopenia 10/29/2015  . History of kidney stones 09/20/2015  . Screening for AAA (aortic abdominal aneurysm) 09/20/2015  . Arthritis 09/08/2015  . Essential hypertension 09/07/2015  . Seasonal allergies 09/07/2015  . Hyperlipidemia 09/07/2015  . CAD (coronary artery disease) 09/07/2015    Family History  Problem Relation Age of Onset  . Heart failure Mother   . Cancer Father     Social History   Tobacco Use  Smoking Status Former Smoker  . Packs/day: 1.00  . Years: 50.00  . Pack years: 50.00  . Types: Cigarettes  . Start date: 11/16/2012  Smokeless Tobacco Never Used  Tobacco Comment   Encouraged to remain smoke free    Past Surgical History:  Procedure Laterality Date  . CARDIAC SURGERY Bilateral 2013   2 stints  1- left side  & 1 right side  . CORONARY ANGIOPLASTY     RCA stent 11/2011; DES LAD 01/2012 [facility] state)  . DECORTICATION Left 11/26/2019   Procedure: DECORTICATION;  Surgeon: [name], MD;  Location: [place];  Service: Thoracic;  Laterality: Left;  . PLEURAL EFFUSION DRAINAGE Left 11/26/2019   Procedure: DRAINAGE OF PLEURAL EFFUSION;  Surgeon: [name], MD;  Location: [place];  Service: Thoracic;  Laterality: Left;  Marland Kitchen VIDEO ASSISTED THORACOSCOPY Left 11/26/2019   Procedure: VIDEO ASSISTED THORACOSCOPY;  Surgeon: [name], MD;  Location: [place];  Service: Thoracic;  Laterality: Left;  Marland Kitchen VIDEO BRONCHOSCOPY N/A 11/26/2019   Procedure: VIDEO BRONCHOSCOPY;  Surgeon: [name], MD;  Location: [place];  Service: Thoracic;  Laterality: N/A;    Immunization History  Administered Date(s) Administered  . Influenza-Unspecified 11/07/2012, 11/10/2013, 11/23/2014  . Pneumococcal Conjugate-13  11/23/2014  . Pneumococcal Polysaccharide-23 06/30/2010    Recent Results (from the past 2160 hour(s))  Urine Culture     Status: None   Collection Time: 01/17/21  9:55 AM   Specimen: Urine  Result Value Ref Range   MICRO NUMBER: [id number]    SPECIMEN QUALITY: Adequate    Sample Source URINE    STATUS: FINAL    Result: No Growth   POCT URINALYSIS DIP (CLINITEK)     Status: Abnormal   Collection Time: 01/17/21 10:02 AM  Result Value Ref Range   Color, UA yellow yellow   Clarity, UA cloudy (A) clear   Glucose, UA negative negative mg/dL   Bilirubin, UA negative negative   Ketones, POC UA negative negative mg/dL   Spec Grav, UA 1.025 1.010 - 1.025   Blood, UA large (A) negative   pH, UA 6.5 5.0 - 8.0   POC PROTEIN,UA negative negative, trace   Urobilinogen, UA 1.0 0.2 or 1.0 E.U./dL   Nitrite, UA Negative Negative   Leukocytes, UA Trace (A) Negative  COMPLETE METABOLIC PANEL WITH GFR     Status: None   Collection Time: 01/17/21 11:21 AM  Result Value Ref  Range   Glucose, Bld 89 65 - 139 mg/dL    Comment: .        Non-fasting reference interval .    BUN 18 7 - 25 mg/dL   Creat 1.10 0.70 - 1.11 mg/dL    Comment: For patients >40 years of age, the reference limit for Creatinine is approximately 13% higher for people identified as African-American. .    GFR, Est Non African American 63 > OR = 60 mL/min/1.11m2   GFR, Est African American 73 > OR = 60 mL/min/1.2m2   BUN/Creatinine Ratio NOT APPLICABLE 6 - 22 (calc)   Sodium 143 135 - 146 mmol/L   Potassium 5.1 3.5 - 5.3 mmol/L   Chloride 107 98 - 110 mmol/L   CO2 27 20 - 32 mmol/L   Calcium 10.0 8.6 - 10.3 mg/dL   Total Protein 6.5 6.1 - 8.1 g/dL   Albumin 4.0 3.6 - 5.1 g/dL   Globulin 2.5 1.9 - 3.7 g/dL (calc)   AG Ratio 1.6 1.0 - 2.5 (calc)   Total Bilirubin 0.4 0.2 - 1.2 mg/dL   Alkaline phosphatase (APISO) 78 35 - 144 U/L   AST 17 10 - 35 U/L   ALT 12 9 - 46 U/L  CBC     Status: None   Collection Time: 01/17/21 11:21 AM  Result Value Ref Range   WBC 9.6 3.8 - 10.8 Thousand/uL   RBC 4.28 4.20 - 5.80 Million/uL   Hemoglobin 13.2 13.2 - 17.1 g/dL   HCT 39.6 38.5 - 50.0 %   MCV 92.5 80.0 - 100.0 fL   MCH 30.8 27.0 - 33.0 pg   MCHC 33.3 32.0 - 36.0 g/dL   RDW 13.3 11.0 - 15.0 %   Platelets 273 140 - 400 Thousand/uL   MPV 10.8 7.5 - 12.5 fL    No results found.     All questions at time of visit were answered - patient instructed to contact office with any additional concerns or updates. ER/RTC precautions were reviewed with the patient as applicable.   Please note: manual typing as well as voice recognition software may have been used to produce this document - typos may escape review. Please contact Dr. Sheppard Coil for any needed clarifications.

## 2021-03-17 NOTE — Patient Instructions (Signed)
NO DRIVING. If you have another stroke behind the wheel, you could kill yourself or someone else. Driving is done!

## 2021-03-19 LAB — COMPLETE METABOLIC PANEL WITH GFR
AG Ratio: 1.7 (calc) (ref 1.0–2.5)
ALT: 10 U/L (ref 9–46)
AST: 17 U/L (ref 10–35)
Albumin: 4 g/dL (ref 3.6–5.1)
Alkaline phosphatase (APISO): 65 U/L (ref 35–144)
BUN/Creatinine Ratio: 15 (calc) (ref 6–22)
BUN: 19 mg/dL (ref 7–25)
CO2: 30 mmol/L (ref 20–32)
Calcium: 9.4 mg/dL (ref 8.6–10.3)
Chloride: 104 mmol/L (ref 98–110)
Creat: 1.3 mg/dL — ABNORMAL HIGH (ref 0.70–1.11)
GFR, Est African American: 59 mL/min/{1.73_m2} — ABNORMAL LOW (ref >=60)
GFR, Est Non African American: 51 mL/min/{1.73_m2} — ABNORMAL LOW (ref >=60)
Globulin: 2.3 g/dL (calc) (ref 1.9–3.7)
Glucose, Bld: 109 mg/dL — ABNORMAL HIGH (ref 65–99)
Potassium: 4.2 mmol/L (ref 3.5–5.3)
Sodium: 142 mmol/L (ref 135–146)
Total Bilirubin: 0.5 mg/dL (ref 0.2–1.2)
Total Protein: 6.3 g/dL (ref 6.1–8.1)

## 2021-03-19 LAB — CBC WITH DIFFERENTIAL/PLATELET
Absolute Monocytes: 908 cells/uL (ref 200–950)
Basophils Absolute: 61 cells/uL (ref 0–200)
Basophils Relative: 0.6 %
Eosinophils Absolute: 184 cells/uL (ref 15–500)
Eosinophils Relative: 1.8 %
HCT: 42.1 % (ref 38.5–50.0)
Hemoglobin: 14.3 g/dL (ref 13.2–17.1)
Lymphs Abs: 5253 cells/uL — ABNORMAL HIGH (ref 850–3900)
MCH: 31.3 pg (ref 27.0–33.0)
MCHC: 34 g/dL (ref 32.0–36.0)
MCV: 92.1 fL (ref 80.0–100.0)
MPV: 10.9 fL (ref 7.5–12.5)
Monocytes Relative: 8.9 %
Neutro Abs: 3794 cells/uL (ref 1500–7800)
Neutrophils Relative %: 37.2 %
Platelets: 249 10*3/uL (ref 140–400)
RBC: 4.57 10*6/uL (ref 4.20–5.80)
RDW: 12.7 % (ref 11.0–15.0)
Total Lymphocyte: 51.5 %
WBC: 10.2 10*3/uL (ref 3.8–10.8)

## 2021-03-19 LAB — URINALYSIS
Bilirubin Urine: NEGATIVE
Glucose, UA: NEGATIVE
Ketones, ur: NEGATIVE
Nitrite: NEGATIVE
Specific Gravity, Urine: 1.021 (ref 1.001–1.03)
pH: 5.5 (ref 5.0–8.0)

## 2021-03-19 LAB — URINE CULTURE
MICRO NUMBER:: 11719049
Result:: NO GROWTH
SPECIMEN QUALITY:: ADEQUATE

## 2021-03-21 DIAGNOSIS — N202 Calculus of kidney with calculus of ureter: Secondary | ICD-10-CM | POA: Diagnosis not present

## 2021-03-22 NOTE — Progress Notes (Signed)
MyChart message sent: No serious concerns on labs, [place] is not anemic.  Unction is a bit decreased which can be due to the dehydration.  Urine culture was negative, no UTI.

## 2021-03-23 DIAGNOSIS — I69322 Dysarthria following cerebral infarction: Secondary | ICD-10-CM | POA: Diagnosis not present

## 2021-03-23 DIAGNOSIS — I2510 Atherosclerotic heart disease of native coronary artery without angina pectoris: Secondary | ICD-10-CM | POA: Diagnosis not present

## 2021-03-23 DIAGNOSIS — I6521 Occlusion and stenosis of right carotid artery: Secondary | ICD-10-CM | POA: Diagnosis not present

## 2021-03-23 DIAGNOSIS — I69354 Hemiplegia and hemiparesis following cerebral infarction affecting left non-dominant side: Secondary | ICD-10-CM | POA: Diagnosis not present

## 2021-03-23 DIAGNOSIS — I69392 Facial weakness following cerebral infarction: Secondary | ICD-10-CM | POA: Diagnosis not present

## 2021-03-23 DIAGNOSIS — I482 Chronic atrial fibrillation, unspecified: Secondary | ICD-10-CM | POA: Diagnosis not present

## 2021-03-23 NOTE — Telephone Encounter (Signed)
Attempted to call pt to schedule surgery clearance appt - vm still full - faxed notification to [facility] -pr

## 2021-03-24 DIAGNOSIS — I69322 Dysarthria following cerebral infarction: Secondary | ICD-10-CM | POA: Diagnosis not present

## 2021-03-24 DIAGNOSIS — I6521 Occlusion and stenosis of right carotid artery: Secondary | ICD-10-CM | POA: Diagnosis not present

## 2021-03-24 DIAGNOSIS — I482 Chronic atrial fibrillation, unspecified: Secondary | ICD-10-CM | POA: Diagnosis not present

## 2021-03-24 DIAGNOSIS — I69392 Facial weakness following cerebral infarction: Secondary | ICD-10-CM | POA: Diagnosis not present

## 2021-03-24 DIAGNOSIS — I2510 Atherosclerotic heart disease of native coronary artery without angina pectoris: Secondary | ICD-10-CM | POA: Diagnosis not present

## 2021-03-24 DIAGNOSIS — I69354 Hemiplegia and hemiparesis following cerebral infarction affecting left non-dominant side: Secondary | ICD-10-CM | POA: Diagnosis not present

## 2021-03-25 ENCOUNTER — Other Ambulatory Visit (HOSPITAL_COMMUNITY)
Admission: RE | Admit: 2021-03-25 | Discharge: 2021-03-25 | Disposition: A | Discharge status: Home / Self Care | Payer: Medicare Other | Source: Ambulatory Visit | Attending: Internal Medicine | Admitting: Internal Medicine

## 2021-03-25 ENCOUNTER — Other Ambulatory Visit: Payer: Self-pay

## 2021-03-25 ENCOUNTER — Ambulatory Visit (INDEPENDENT_AMBULATORY_CARE_PROVIDER_SITE_OTHER): Payer: Medicare Other

## 2021-03-25 ENCOUNTER — Ambulatory Visit (INDEPENDENT_AMBULATORY_CARE_PROVIDER_SITE_OTHER): Payer: Medicare Other | Admitting: Adult Health

## 2021-03-25 ENCOUNTER — Encounter: Payer: Self-pay | Admitting: Adult Health

## 2021-03-25 DIAGNOSIS — U071 COVID-19: Secondary | ICD-10-CM | POA: Diagnosis not present

## 2021-03-25 DIAGNOSIS — J1282 Pneumonia due to coronavirus disease 2019: Secondary | ICD-10-CM | POA: Diagnosis not present

## 2021-03-25 DIAGNOSIS — Z01811 Encounter for preprocedural respiratory examination: Secondary | ICD-10-CM | POA: Insufficient documentation

## 2021-03-25 DIAGNOSIS — J90 Pleural effusion, not elsewhere classified: Secondary | ICD-10-CM

## 2021-03-25 DIAGNOSIS — Z20822 Contact with and (suspected) exposure to covid-19: Secondary | ICD-10-CM | POA: Insufficient documentation

## 2021-03-25 DIAGNOSIS — Z01812 Encounter for preprocedural laboratory examination: Secondary | ICD-10-CM | POA: Insufficient documentation

## 2021-03-25 DIAGNOSIS — J9611 Chronic respiratory failure with hypoxia: Secondary | ICD-10-CM

## 2021-03-25 DIAGNOSIS — R0602 Shortness of breath: Secondary | ICD-10-CM | POA: Diagnosis not present

## 2021-03-25 LAB — SARS CORONAVIRUS 2 (TAT 6-24 HRS): SARS Coronavirus 2: NEGATIVE

## 2021-03-25 NOTE — Progress Notes (Signed)
Called and spoke with patient's wife, Bakary Bramer, listed on DPR, provided results/recommendations per Rexene Edison NP.  She verified understanding.  Nothing further needed.

## 2021-03-25 NOTE — Assessment & Plan Note (Signed)
Patient has ongoing exertional desaturations.  Patient has multiple comorbidities including previous Covid pneumonia, heavy smoking history, previous stroke, A. Fib. Patient will require exertional oxygen 2 L on continuous flow or 4 L pulsed oxygen.  We will check overnight oximetry to to see if he needs nocturnal oxygen. Order for POC sent to DME. Check PFTs on return.  Plan  Patient Instructions  Chest xray today .  Use oxygen 2l/m with activity (continuous flow -at home concentrator ) , on the small POC use 4l/m pulse oxygen with activity .  Check overnight oximetry on room (this is to check to see if you need oxygen while you are sleeping)  Set up for PFT .  Order for POC .  I will finish the surgical risk assessment once I have your PFT results next week.  Follow up with Dr. Melvyn Novas  In 3 months and As needed

## 2021-03-25 NOTE — Assessment & Plan Note (Signed)
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  Patient did have previous heavy smoking history and also Covid pneumonia in 2021.  We will follow up on PFTs if needed can consider a dedicated high-resolution CT chest to further evaluate.

## 2021-03-25 NOTE — Patient Instructions (Addendum)
Chest xray today .  Use oxygen 2l/m with activity (continuous flow -at home concentrator ) , on the small POC use 4l/m pulse oxygen with activity .  Check overnight oximetry on room (this is to check to see if you need oxygen while you are sleeping)  Set up for PFT .  Order for POC .  I will finish the surgical risk assessment once I have your PFT results next week.  Follow up with Dr. Melvyn Novas  In 3 months and As needed

## 2021-03-25 NOTE — Progress Notes (Signed)
@Patient  ID: Timothy [name], male    DOB: 1941/02/01, 80 y.o.   MRN: [id number]  Chief Complaint  Patient presents with  . Follow-up    Referring provider: Emeterio Reeve, DO  HPI: 80 year old male former smoker with previous history of left-sided pleural effusion requiring drainage and decortication December 2020.  No evidence of malignancy felt likely to be a postpneumonic effusion. [id number] complicated by Covid pneumonia in October 2021 requiring hospitalization and initiation of oxygen  TEST/EVENTS :    03/25/2021 Follow up : Chronic respiratory failure on oxygen, preop pulmonary assessment Patient presents accompanied by his wife.  Patient has previously been seen in the past with a previous left-sided pleural effusion that required drainage and decortication with thoracic surgery in December 2020.  Felt to be a postpneumonic effusion.  Patient was admitted in October 2021 with Covid 19 infection complicated by Covid pneumonia.  He did begin oxygen at this time.  Patient says he was doing well up until the last 2 months or so.  He was admitted in February with acute stroke.  This was at at [facility].  He has been followed by neurology.  Care everywhere notes indicate that he had a moderate size acute infarct in the right temporal lobe.  And several subcentimeter acute infarcts.  Echo showed preserved EF.  And no evidence of interarterial shunt on February 07, 2021.  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  Notes reveal stroke was thought to be due to emboli from his right ASCA stenosis.  He was not a candidate for TPA.  Since stroke patient's wife says that he is slowly improving but continues to have impulsive behavior and personality changes. He also has had ongoing hematuria and is being evaluated by [facility]. CT abdomen and pelvis January 17, 2021 showed a left-sided hydronephrosis and kidney stones. 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 Patient needs a preop pulmonary risk assessment as he is going to be undergoing surgery with general anesthesia soon for kidney stones and evaluation for possible bladder cancer. Patient says he was previously on oxygen but has not been using it.  Says that his oxygen levels only dropped down into the 80s and quickly rebound.  Today in the office O2 saturations decreased to 85% with ambulation.  Patient was placed on oxygen with improvement of O2 saturations greater than 90%.  Patient was evaluated on a portable oxygen concentrator and required 4 L of pulsed oxygen to maintain O2 saturations greater than 88 to 90%.  Patient says he cannot use a portable tanks as he is balance is off and he cannot lift these tanks to carry them with activity so if he is going to use oxygen with activity he needs a portable oxygen concentrator Patient says overall his breathing is doing well.  He denies any flare of cough or wheezing currently.  He does get winded with heavy activity and can only walk a short distance without having to sit down to rest. He has no previous spirometry or pulmonary function testing.  He is a former smoker.    No Known Allergies  Immunization History  Administered Date(s) Administered  . Influenza-Unspecified 11/07/2012, 11/10/2013, 11/23/2014  . Pneumococcal Conjugate-13 11/23/2014  . Pneumococcal Polysaccharide-23 06/30/2010    Past Medical History:  Diagnosis Date  . Arthritis 09/08/2015  . CAD (coronary artery disease) 09/07/2015   S/P RCA stent x2, Hx MI, (+)ACEI, Statin, BB, ASA - 10/2012   .  Essential hypertension 09/07/2015  . History of kidney stones 09/20/2015   11/2013 32mm stone  . Hyperlipidemia 09/07/2015  . Myocardial infarction ([place])    11/2011, s/p RCA stent  . Osteopenia 10/29/2015   FRAX Score: 10 year risk of major osteoporotic fracture 8.9%, hip fracture 3.2%, prescription treatment is indicated, called in VitD/Ca, +/- bisphosphanate   . PAF  (paroxysmal atrial fibrillation) ([place])   . Screening for AAA (aortic abdominal aneurysm) 09/20/2015   Neg 06/11/15 per record review  . Seasonal allergies 09/07/2015    Tobacco History: Social History   Tobacco Use  Smoking Status Former Smoker  . Packs/day: 1.00  . Years: 50.00  . Pack years: 50.00  . Types: Cigarettes  . Start date: 11/16/2012  . Quit date: 2013  . Years since quitting: 9.2  Smokeless Tobacco Never Used   Counseling given: Not Answered   Outpatient Medications Prior to Visit  Medication Sig Dispense Refill  . albuterol (VENTOLIN HFA) 108 (90 Base) MCG/ACT inhaler Inhale 1-2 puffs into the lungs every 6 (six) hours as needed for wheezing or shortness of breath. 18 g 1  . montelukast (SINGULAIR) 10 MG tablet TAKE 1 TABLET DAILY 90 tablet 3  . Calcium Carbonate-Vitamin D 600-400 MG-UNIT chew tablet Chew 2 tablets by mouth daily. 180 tablet 3  . Cyanocobalamin (VITAMIN B 12 PO) Take by mouth. 2500 mcg two  sublingual tabs    . ELIQUIS 5 MG TABS tablet TAKE 1 TABLET TWICE A DAY 180 tablet 1  . fluticasone (FLONASE) 50 MCG/ACT nasal spray One spray in each nostril twice a day, use left hand for right nostril, and right hand for left nostril. (Patient not taking: Reported on 03/25/2021) 48 g 3  . melatonin 1 MG TABS tablet Take 5 mg by mouth at bedtime.    . metoprolol succinate (TOPROL-XL) 25 MG 24 hr tablet TAKE ONE-HALF (1/2) TABLET DAILY 45 tablet 3  . Multiple Vitamins-Minerals (AIRBORNE PO) Take 1 tablet by mouth at bedtime.     Marland Kitchen telmisartan (MICARDIS) 20 MG tablet Take 20 mg by mouth daily.    Marland Kitchen telmisartan (MICARDIS) 40 MG tablet Take 20 mg by mouth daily.    . Turmeric 500 MG TABS Take 1,000 mg by mouth daily.     Marland Kitchen UNABLE TO FIND Med Name: sonavel for tendonitis    . UNABLE TO FIND once. Med Name: Eye vitamin, pressure vision (2 tabs daily)     No facility-administered medications prior to visit.     Review of Systems:   Constitutional:   No  weight  loss, night sweats,  Fevers, chills, + fatigue, or  lassitude.  HEENT:   No headaches,  Difficulty swallowing,  Tooth/dental problems, or  Sore throat,                No sneezing, itching, ear ache, nasal congestion, post nasal drip,   CV:  No chest pain,  Orthopnea, PND, swelling in lower extremities, anasarca, dizziness, palpitations, syncope.   GI  No heartburn, indigestion, abdominal pain, nausea, vomiting, diarrhea, change in bowel habits, loss of appetite, bloody stools.   Resp:    No chest wall deformity  Skin: no rash or lesions.  GU: no dysuria, change in color of urine, no urgency or frequency.  No flank pain, no hematuria   MS:  No joint pain or swelling.  No decreased range of motion.  No back pain.    Physical Exam  BP 124/80 (BP Location: Left  Arm, Cuff Size: Normal)   Pulse 83   Temp 97.6 F (36.4 C) (Temporal)   Ht 6\' 2"  (1.88 m)   Wt 220 lb 6.4 oz (100 kg)   SpO2 96% Comment: RA  BMI 28.30 kg/m   GEN: A/Ox3; pleasant , NAD, elderly, gait imbalance   HEENT:  [place]/AT,   NOSE-clear, THROAT-clear, no lesions, no postnasal drip or exudate noted.   NECK:  Supple w/ fair ROM; no JVD; normal carotid impulses w/o bruits; no thyromegaly or nodules palpated; no lymphadenopathy.    RESP  Clear  P & A; w/o, wheezes/ rales/ or rhonchi. no accessory muscle use, no dullness to percussion  CARD:  RRR, no m/r/g, tr peripheral edema, pulses intact, no cyanosis or clubbing.  GI:   Soft & nt; nml bowel sounds; no organomegaly or masses detected.   Musco: Warm bil, no deformities or joint swelling noted.   Neuro: alert, no focal deficits noted.    Skin: Warm, no lesions or rashes    Lab Results:  CBC    Component Value Date/Time   WBC 10.2 03/17/2021 0000   RBC 4.57 03/17/2021 0000   HGB 14.3 03/17/2021 0000   HCT 42.1 03/17/2021 0000   PLT 249 03/17/2021 0000   MCV 92.1 03/17/2021 0000   MCH 31.3 03/17/2021 0000   MCHC 34.0 03/17/2021 0000   RDW 12.7  03/17/2021 0000   LYMPHSABS 5,253 (H) 03/17/2021 0000   MONOABS 1.2 (H) 11/04/2019 1555   EOSABS 184 03/17/2021 0000   BASOSABS 61 03/17/2021 0000    BMET    Component Value Date/Time   NA 142 03/17/2021 0000   NA 144 02/25/2013 0000   K 4.2 03/17/2021 0000   CL 104 03/17/2021 0000   CO2 30 03/17/2021 0000   GLUCOSE 109 (H) 03/17/2021 0000   BUN 19 03/17/2021 0000   BUN 26 (A) 02/25/2013 0000   CREATININE 1.30 (H) 03/17/2021 0000   CALCIUM 9.4 03/17/2021 0000   GFRNONAA 51 (L) 03/17/2021 0000   GFRAA 59 (L) 03/17/2021 0000    BNP    Component Value Date/Time   BNP 95 11/10/2020 1141    ProBNP No results found for: PROBNP  Imaging: DG Chest 2 View  Result Date: 03/25/2021 CLINICAL DATA:  Shortness of breath. EXAM: CHEST - 2 VIEW COMPARISON:  December 23, 2020. FINDINGS: The heart size and mediastinal contours are within normal limits. No pneumothorax or pleural effusion is noted. Stable bilateral pulmonary scarring is noted. No acute abnormality is noted. The visualized skeletal structures are unremarkable. IMPRESSION: No active cardiopulmonary disease. Electronically Signed   By: Marijo Conception M.D.   On: 03/25/2021 11:43      No flowsheet data found.  No results found for: NITRICOXIDE      Assessment & Plan:   Pleural effusion on left Chest x-ray today shows no significant pleural effusion.  Chronic respiratory failure with hypoxia [facility]) Patient has ongoing exertional desaturations.  Patient has multiple comorbidities including previous Covid pneumonia, heavy smoking history, previous stroke, A. Fib. Patient will require exertional oxygen 2 L on continuous flow or 4 L pulsed oxygen.  We will check overnight oximetry to to see if he needs nocturnal oxygen. Order for POC sent to DME. Check PFTs on return.  Plan  Patient Instructions  Chest xray today .  Use oxygen 2l/m with activity (continuous flow -at home concentrator ) , on the small POC use 4l/m pulse  oxygen with activity .  Check  overnight oximetry on room (this is to check to see if you need oxygen while you are sleeping)  Set up for PFT .  Order for POC .  I will finish the surgical risk assessment once I have your PFT results next week.  Follow up with Dr. 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  Patient did have previous heavy smoking history and also Covid pneumonia in 2021.  We will follow up on PFTs if needed can consider a dedicated high-resolution CT chest to further evaluate.    Preop pulmonary/respiratory exam Preop pulmonary risk assessment. Patient has multiple comorbidities including heavy smoking history, A. fib, recent stroke, elderly, deconditioned, chronic respiratory failure with exertional hypoxemia. We will check PFTs to get pulmonary function baseline. Once these are complete will complete assessment-patient currently is at least a moderate to high risk but not preclude upcoming surgery for gross hematuria for several months with underlying hydronephrosis, kidney stones and concern for possible underlying bladder cancer. Would undergo surgery and hospital setting. Long discussion with patient and wife regarding pulmonary risk.    Major Pulmonary risks identified in the multifactorial risk analysis are but not limited to a) pneumonia; b) recurrent intubation risk; c) prolonged or recurrent acute respiratory failure needing mechanical ventilation; d) prolonged hospitalization; e) DVT/Pulmonary embolism; f) Acute Pulmonary edema  Recommend 1. Short duration of surgery as much as possible and avoid paralytic if possible 2. Recovery in step down or ICU with Pulmonary consultation if indicated .  3. DVT prophylaxis if indicated.  4. Aggressive pulmonary toilet with o2, bronchodilatation, and incentive spirometry and early ambulation        Syed Zukas, NP 03/25/2021

## 2021-03-25 NOTE — Assessment & Plan Note (Signed)
Preop pulmonary risk assessment. Patient has multiple comorbidities including heavy smoking history, A. fib, recent stroke, elderly, deconditioned, chronic respiratory failure with exertional hypoxemia. We will check PFTs to get pulmonary function baseline. Once these are complete will complete assessment-patient currently is at least a moderate to high risk but not preclude upcoming surgery for gross hematuria for several months with underlying hydronephrosis, kidney stones and concern for possible underlying bladder cancer. Would undergo surgery and hospital setting. Long discussion with patient and wife regarding pulmonary risk.    Major Pulmonary risks identified in the multifactorial risk analysis are but not limited to a) pneumonia; b) recurrent intubation risk; c) prolonged or recurrent acute respiratory failure needing mechanical ventilation; d) prolonged hospitalization; e) DVT/Pulmonary embolism; f) Acute Pulmonary edema  Recommend 1. Short duration of surgery as much as possible and avoid paralytic if possible 2. Recovery in step down or ICU with Pulmonary consultation if indicated .  3. DVT prophylaxis if indicated.  4. Aggressive pulmonary toilet with o2, bronchodilatation, and incentive spirometry and early ambulation

## 2021-03-25 NOTE — Assessment & Plan Note (Signed)
Chest x-ray today shows no significant pleural effusion.

## 2021-03-28 ENCOUNTER — Other Ambulatory Visit: Payer: Self-pay

## 2021-03-28 ENCOUNTER — Encounter: Payer: Self-pay | Admitting: *Deleted

## 2021-03-28 ENCOUNTER — Ambulatory Visit (INDEPENDENT_AMBULATORY_CARE_PROVIDER_SITE_OTHER): Payer: Medicare Other | Admitting: Internal Medicine

## 2021-03-28 DIAGNOSIS — I2510 Atherosclerotic heart disease of native coronary artery without angina pectoris: Secondary | ICD-10-CM | POA: Diagnosis not present

## 2021-03-28 DIAGNOSIS — I69354 Hemiplegia and hemiparesis following cerebral infarction affecting left non-dominant side: Secondary | ICD-10-CM | POA: Diagnosis not present

## 2021-03-28 DIAGNOSIS — I6521 Occlusion and stenosis of right carotid artery: Secondary | ICD-10-CM | POA: Diagnosis not present

## 2021-03-28 DIAGNOSIS — I482 Chronic atrial fibrillation, unspecified: Secondary | ICD-10-CM | POA: Diagnosis not present

## 2021-03-28 DIAGNOSIS — J90 Pleural effusion, not elsewhere classified: Secondary | ICD-10-CM | POA: Diagnosis not present

## 2021-03-28 DIAGNOSIS — I69322 Dysarthria following cerebral infarction: Secondary | ICD-10-CM | POA: Diagnosis not present

## 2021-03-28 DIAGNOSIS — I69392 Facial weakness following cerebral infarction: Secondary | ICD-10-CM | POA: Diagnosis not present

## 2021-03-28 LAB — PULMONARY FUNCTION TEST
DL/VA % pred: 70 %
DL/VA: 2.73 ml/min/mmHg/L
DLCO cor % pred: 46 %
DLCO cor: 11.7 ml/min/mmHg
DLCO unc % pred: 46 %
DLCO unc: 11.6 ml/min/mmHg
FEF 25-75 Post: 2.56 L/sec
FEF 25-75 Pre: 2.24 L/sec
FEF2575-%Change-Post: 14 %
FEF2575-%Pred-Post: 126 %
FEF2575-%Pred-Pre: 111 %
FEV1-%Change-Post: 2 %
FEV1-%Pred-Post: 80 %
FEV1-%Pred-Pre: 78 %
FEV1-Post: 2.37 L
FEV1-Pre: 2.31 L
FEV1FVC-%Change-Post: 3 %
FEV1FVC-%Pred-Pre: 109 %
FEV6-%Change-Post: 0 %
FEV6-%Pred-Post: 75 %
FEV6-%Pred-Pre: 75 %
FEV6-Post: 2.92 L
FEV6-Pre: 2.94 L
FEV6FVC-%Change-Post: 0 %
FEV6FVC-%Pred-Post: 106 %
FEV6FVC-%Pred-Pre: 106 %
FVC-%Change-Post: -1 %
FVC-%Pred-Post: 70 %
FVC-%Pred-Pre: 71 %
FVC-Post: 2.92 L
FVC-Pre: 2.96 L
Post FEV1/FVC ratio: 81 %
Post FEV6/FVC ratio: 100 %
Pre FEV1/FVC ratio: 78 %
Pre FEV6/FVC Ratio: 100 %
RV % pred: 59 %
RV: 1.63 L
TLC % pred: 66 %
TLC: 4.86 L

## 2021-03-28 NOTE — Progress Notes (Signed)
PFT done today. 

## 2021-03-30 DIAGNOSIS — Z87440 Personal history of urinary (tract) infections: Secondary | ICD-10-CM | POA: Diagnosis not present

## 2021-03-30 DIAGNOSIS — I482 Chronic atrial fibrillation, unspecified: Secondary | ICD-10-CM | POA: Diagnosis not present

## 2021-03-30 DIAGNOSIS — I69392 Facial weakness following cerebral infarction: Secondary | ICD-10-CM | POA: Diagnosis not present

## 2021-03-30 DIAGNOSIS — I69322 Dysarthria following cerebral infarction: Secondary | ICD-10-CM | POA: Diagnosis not present

## 2021-03-30 DIAGNOSIS — I2510 Atherosclerotic heart disease of native coronary artery without angina pectoris: Secondary | ICD-10-CM | POA: Diagnosis not present

## 2021-03-30 DIAGNOSIS — N189 Chronic kidney disease, unspecified: Secondary | ICD-10-CM | POA: Diagnosis not present

## 2021-03-30 DIAGNOSIS — Z8616 Personal history of COVID-19: Secondary | ICD-10-CM | POA: Diagnosis not present

## 2021-03-30 DIAGNOSIS — I129 Hypertensive chronic kidney disease with stage 1 through stage 4 chronic kidney disease, or unspecified chronic kidney disease: Secondary | ICD-10-CM | POA: Diagnosis not present

## 2021-03-30 DIAGNOSIS — I6521 Occlusion and stenosis of right carotid artery: Secondary | ICD-10-CM | POA: Diagnosis not present

## 2021-03-30 DIAGNOSIS — Z7901 Long term (current) use of anticoagulants: Secondary | ICD-10-CM | POA: Diagnosis not present

## 2021-03-30 DIAGNOSIS — I69354 Hemiplegia and hemiparesis following cerebral infarction affecting left non-dominant side: Secondary | ICD-10-CM | POA: Diagnosis not present

## 2021-04-04 NOTE — Progress Notes (Signed)
ATC x1, no answer, vm full.  Will attempt on 04/05/21.

## 2021-04-05 ENCOUNTER — Telehealth: Payer: Self-pay

## 2021-04-05 DIAGNOSIS — I69322 Dysarthria following cerebral infarction: Secondary | ICD-10-CM | POA: Diagnosis not present

## 2021-04-05 DIAGNOSIS — I69354 Hemiplegia and hemiparesis following cerebral infarction affecting left non-dominant side: Secondary | ICD-10-CM | POA: Diagnosis not present

## 2021-04-05 DIAGNOSIS — I6521 Occlusion and stenosis of right carotid artery: Secondary | ICD-10-CM | POA: Diagnosis not present

## 2021-04-05 DIAGNOSIS — I69392 Facial weakness following cerebral infarction: Secondary | ICD-10-CM | POA: Diagnosis not present

## 2021-04-05 DIAGNOSIS — I482 Chronic atrial fibrillation, unspecified: Secondary | ICD-10-CM | POA: Diagnosis not present

## 2021-04-05 DIAGNOSIS — I2510 Atherosclerotic heart disease of native coronary artery without angina pectoris: Secondary | ICD-10-CM | POA: Diagnosis not present

## 2021-04-05 NOTE — Telephone Encounter (Signed)
Occupational therapist Olegario Messier called stating that patient has completed his occupational therapy. Pt has met OT goals and will be discharged to go home.

## 2021-04-07 ENCOUNTER — Encounter: Payer: Self-pay | Admitting: *Deleted

## 2021-04-07 NOTE — Progress Notes (Signed)
ATC x2, no answer, VM full.  Unable to reach letter sent.

## 2021-04-12 DIAGNOSIS — I482 Chronic atrial fibrillation, unspecified: Secondary | ICD-10-CM | POA: Diagnosis not present

## 2021-04-12 DIAGNOSIS — I2510 Atherosclerotic heart disease of native coronary artery without angina pectoris: Secondary | ICD-10-CM | POA: Diagnosis not present

## 2021-04-12 DIAGNOSIS — I69322 Dysarthria following cerebral infarction: Secondary | ICD-10-CM | POA: Diagnosis not present

## 2021-04-12 DIAGNOSIS — I69354 Hemiplegia and hemiparesis following cerebral infarction affecting left non-dominant side: Secondary | ICD-10-CM | POA: Diagnosis not present

## 2021-04-12 DIAGNOSIS — I6521 Occlusion and stenosis of right carotid artery: Secondary | ICD-10-CM | POA: Diagnosis not present

## 2021-04-12 DIAGNOSIS — I69392 Facial weakness following cerebral infarction: Secondary | ICD-10-CM | POA: Diagnosis not present

## 2021-04-14 ENCOUNTER — Telehealth: Payer: Self-pay | Admitting: Internal Medicine

## 2021-04-14 DIAGNOSIS — R0602 Shortness of breath: Secondary | ICD-10-CM

## 2021-04-14 NOTE — Telephone Encounter (Signed)
Called and spoke with patient's wife, Timothy [name], regarding PFT results and surgical clearance per Rexene Edison NP.  She verbalized understanding. Advised we would get this information to the surgeon, Dr. Dannielle Karvonen so they can proceed with the surgery.  She does not have a date for the surgery at this point, they will call us to schedule the CT after his surgery.  Order for HR CT scan ordered, note in scheduling comments that wife will call us after surgery to schedule.  Nothing further needed.  Tammy, this is just an [place], wife, Olegario Shearer) will call us after his procedure/surgery.  Thanks

## 2021-04-25 ENCOUNTER — Telehealth: Payer: Self-pay | Admitting: Internal Medicine

## 2021-04-25 DIAGNOSIS — Z955 Presence of coronary angioplasty implant and graft: Secondary | ICD-10-CM | POA: Diagnosis not present

## 2021-04-25 DIAGNOSIS — I2510 Atherosclerotic heart disease of native coronary artery without angina pectoris: Secondary | ICD-10-CM | POA: Diagnosis not present

## 2021-04-25 DIAGNOSIS — I10 Essential (primary) hypertension: Secondary | ICD-10-CM | POA: Diagnosis not present

## 2021-04-25 DIAGNOSIS — I252 Old myocardial infarction: Secondary | ICD-10-CM | POA: Diagnosis not present

## 2021-04-25 DIAGNOSIS — I480 Paroxysmal atrial fibrillation: Secondary | ICD-10-CM | POA: Diagnosis not present

## 2021-04-25 NOTE — Telephone Encounter (Signed)
Faxed note with risk assessment from 03/25/21 and received fax confirmation. Nothing further needed at this time.

## 2021-04-29 ENCOUNTER — Ambulatory Visit
Admission: RE | Admit: 2021-04-29 | Discharge: 2021-04-29 | Disposition: A | Discharge status: Home / Self Care | Payer: Medicare Other | Source: Ambulatory Visit | Attending: Adult Health | Admitting: Adult Health

## 2021-04-29 DIAGNOSIS — R0602 Shortness of breath: Secondary | ICD-10-CM

## 2021-04-29 DIAGNOSIS — I2510 Atherosclerotic heart disease of native coronary artery without angina pectoris: Secondary | ICD-10-CM | POA: Diagnosis not present

## 2021-04-29 DIAGNOSIS — J849 Interstitial pulmonary disease, unspecified: Secondary | ICD-10-CM | POA: Diagnosis not present

## 2021-04-29 DIAGNOSIS — J432 Centrilobular emphysema: Secondary | ICD-10-CM | POA: Diagnosis not present

## 2021-04-29 DIAGNOSIS — J929 Pleural plaque without asbestos: Secondary | ICD-10-CM | POA: Diagnosis not present

## 2021-05-04 ENCOUNTER — Encounter: Payer: Self-pay | Admitting: Adult Health

## 2021-05-04 DIAGNOSIS — J849 Interstitial pulmonary disease, unspecified: Secondary | ICD-10-CM | POA: Insufficient documentation

## 2021-05-04 NOTE — Progress Notes (Signed)
Called and spoke with patient's wife, Timothy [name], listed on the DPR, advised of the results/recommendations per Rexene Edison NP.  She verbalized understanding.  Scheduled OV with Dr. Melvyn Novas, first available 05/24/21 at 3:30 pm.  Advised to arrive by 3:15 pm for check in.  Nothing further needed.

## 2021-05-24 ENCOUNTER — Ambulatory Visit (INDEPENDENT_AMBULATORY_CARE_PROVIDER_SITE_OTHER): Payer: Medicare Other | Admitting: Internal Medicine

## 2021-05-24 ENCOUNTER — Encounter: Payer: Self-pay | Admitting: Internal Medicine

## 2021-05-24 ENCOUNTER — Other Ambulatory Visit: Payer: Self-pay

## 2021-05-24 DIAGNOSIS — J61 Pneumoconiosis due to asbestos and other mineral fibers: Secondary | ICD-10-CM | POA: Diagnosis not present

## 2021-05-24 DIAGNOSIS — J90 Pleural effusion, not elsewhere classified: Secondary | ICD-10-CM

## 2021-05-24 DIAGNOSIS — J9611 Chronic respiratory failure with hypoxia: Secondary | ICD-10-CM

## 2021-05-24 NOTE — Assessment & Plan Note (Signed)
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 Roxan Hockey in Dec 2020, underwent drainage of effusion and decortication on 11/26/19. No evidence of malignancy, felt likely to be postpneumonic effusion.  - cxr 12/23/2020 s significant reaccumulation   Ct 04/29/20 now shows now raccumulation of fluid butstrongly supports asbestosis as the underlying dx > see sep a/p

## 2021-05-24 NOTE — Progress Notes (Signed)
Timothy [name], male    DOB: 1941-08-25,   MRN: [id number]   Brief patient profile:  22 yowm retired [employer] Timothy [name] quit smoking 2013 and vaping about mid 09/2019 with dry cough  Since 2005 while on ACEi  New onset L ant and lower post  Chest pain mid  oct 2020 chills and low grade  admitted 10/29/2019 and dx L Pl effusion with pleural plaque L base on CT chest  >>  thoracentesis 10/30/2019 x 400 cc serous fluid exudative with Polys > Lymphs and inflammatory cytology so rx'd as parapneumonic and self referred to pulmonary clinic 11/04/2019      History of Present Illness  11/04/2019  Pulmonary/ 1st office eval/Baudelio Karnes  Chief Complaint  Patient presents with  . Pulmonary Consult    Self referral for pleural effusion. He c/o SOB and non prod cough for the past month. He is using an albuterol inhaler 2-3 x per day  Dyspnea:  MMRC3 = can't walk 100 yards even at a slow pace at a flat grade s stopping due to sob   Cough: dry daytime and also night x years  Sleep: Has not been able to lie flat in bed back pain x 40 y SABA use: new start, does not help  L chest pain with cough is better as is sob p tcentesis Rec: Re tap and do ct p tap > done 11/10/23 x 5 cc only with glucose up to 75 and no evidence of lung ca   11/11/2019  f/u ov/Daurice Ovando re:  L loculated pleural effusion  Chief Complaint  Patient presents with  . Follow-up    Discuss results of ct chest. Breathing is unchanged since the last visit.   Dyspnea:  = MMRC3 = can't walk 100 yards even at a slow pace at a flat grade s stopping due to sob   Cough: not productive at all  Sleeping: baselin 45 degrees x 40 years, now ever since oct 2020  having to sit upright SABA use:  Starts working p about 10 min helps chest congestion lasts for hours   02: no  Recurrent  R foot swelling x  Years no change  Having daily chills since oct 2021 set Temp up 78 to compensate which usually he leaves at 70 Skin test positive in [place]  1990s no sputum available but told it was  "false positive"  rec Only use your albuterol as a rescue medication to be used if you can't catch your breath by resting or you have chest heaviness  - The less you use it, the better it will work when you need it. - Ok to use up to 2 puffs  every 4 hours if you must but call for immediate appointment if use goes up over your usual need - Don't leave home without it !!  (think of it like the spare tire for your car)  Thoracic surgery Dr. Roxan Hockey in Dec 2020, underwent drainage of effusion and decortication on 11/26/19. No evidence of malignancy, felt likely to be postpneumonic effusion.   11/26/2019 Decortication Dr Roxan Hockey > no clear evidence of asbestos involvement or CA 1. Video bronchoscopy. 2. Left video-assisted thoracoscopy. 3. Drainage of pleural effusion.  4. Visceral and parietal pleural decortication. 5. Intercostal nerve block.     Admitted with covid 19 pna  10/09/20 p onset of symtoms 09/30/20   12/23/2020  f/u ov/Paidyn Mcferran re:  S/p decortication on L  11/26/2019  Chief Complaint  Patient presents with  . Follow-up    Breathing has improved some since his last visit. He has occ cough at night. He is using his albuterol inhaler 3 x per day on average.   Dyspnea: more limited by energy than breathing  Cough: hs p lie down / non productive trend is better  Sleeping: hosp bed @ in 45 degrees SABA use: as above  02: 3-4 lpm sats running low 90s  rec Goal is to keep your 02 sats above 90% no higher needed No change in medications. Please remember to go to the  x-ray department  for your tests - we will call you with the results when they are available.  I very strongly recommend you get the moderna or pfizer vaccine as soon as possible based on your risk of dying from the virus  and the proven safety and benefit of these vaccines against even the omicron variants.         05/24/2021  f/u ov/Hlee Fringer re: post covid ild/ no  longer 02 dep  Chief Complaint  Patient presents with  . Follow-up    Here to discuss CT Chest results. Pt states his breathing is doing well. He has some throat clearing at night. He rarely uses his albuterol inhaler.   Dyspnea:  100 ft to mailbox slt downhill he says due to calves hurting / wife he's laboring to breathe  Cough: some sense of drainage at hs but not producing anything  Sleeping: on back hosp bed maybe 45 degrees (f anything it's lower than previous) SABA use: rarely  02: none at all  Covid status:   Never vaccinated, probably had delta   No obvious day to day or daytime variability or assoc excess/ purulent sputum or mucus plugs or hemoptysis or cp or chest tightness, subjective wheeze or overt sinus or hb symptoms.   skeeping as above  without nocturnal  or early am exacerbation  of respiratory  c/o's or need for noct saba. Also denies any obvious fluctuation of symptoms with weather or environmental changes or other aggravating or alleviating factors except as outlined above   No unusual exposure hx or h/o childhood pna/ asthma or knowledge of premature birth.  Current Allergies, Complete Past Medical History, Past Surgical History, Family History, and Social History were reviewed in [facility] record.  ROS  The following are not active complaints unless bolded Hoarseness, sore throat, dysphagia, dental problems, itching, sneezing,  nasal congestion or discharge of excess mucus or purulent secretions, ear ache,   fever, chills, sweats, unintended wt loss or wt gain, classically pleuritic or exertional cp,  orthopnea pnd or arm/hand swelling  or leg swelling, presyncope, palpitations, abdominal pain, anorexia, nausea, vomiting, diarrhea  or change in bowel habits or change in bladder habits, change in stools or change in urine, dysuria, hematuria,  rash, arthralgias/calves get tire/ achy , visual complaints, headache, numbness, weakness or ataxia or  problems with walking or coordination,  change in mood or  memory.        Current Meds  Medication Sig  . albuterol (VENTOLIN HFA) 108 (90 Base) MCG/ACT inhaler Inhale 1-2 puffs into the lungs every 6 (six) hours as needed for wheezing or shortness of breath.  Renard Hamper Pepper-Turmeric (TURMERIC CURCUMIN) 04-999 MG CAPS Take 2 capsules by mouth daily.  . Calcium-Phosphorus-Vitamin D (CALCIUM GUMMIES PO) Take by mouth daily.  . Cyanocobalamin (VITAMIN B 12 PO) Take by mouth. 2500 mcg two  sublingual tabs  .  ELIQUIS 5 MG TABS tablet TAKE 1 TABLET TWICE A DAY  . fluticasone (FLONASE) 50 MCG/ACT nasal spray One spray in each nostril twice a day, use left hand for right nostril, and right hand for left nostril.  . metoprolol succinate (TOPROL-XL) 25 MG 24 hr tablet TAKE ONE-HALF (1/2) TABLET DAILY  . montelukast (SINGULAIR) 10 MG tablet TAKE 1 TABLET DAILY  . Multiple Vitamins-Minerals (AIRBORNE PO) Take 1 tablet by mouth at bedtime.   Marland Kitchen telmisartan (MICARDIS) 20 MG tablet Take 20 mg by mouth daily.  Marland Kitchen UNABLE TO FIND Med Name: sonavel for tendonitis  . UNABLE TO FIND once. Med Name: Eye vitamin, pressure vision (2 tabs daily)  . [DISCONTINUED] Calcium Carbonate-Vitamin D 600-400 MG-UNIT chew tablet Chew 2 tablets by mouth daily.                    Past Medical History:  Diagnosis Date  . Arthritis 09/08/2015  . CAD (coronary artery disease) 09/07/2015   S/P RCA stent x2, Hx MI, (+)ACEI, Statin, BB, ASA - 10/2012   . Essential hypertension 09/07/2015  . History of kidney stones 09/20/2015   11/2013 61mm stone  . Hyperlipidemia 09/07/2015  . Osteopenia 10/29/2015   FRAX Score: 10 year risk of major osteoporotic fracture 8.9%, hip fracture 3.2%, prescription treatment is indicated, called in VitD/Ca, +/- bisphosphanate   . Screening for AAA (aortic abdominal aneurysm) 09/20/2015   Neg 06/11/15 per record review  . Seasonal allergies 09/07/2015       Objective:      05/24/2021          212  12/23/2020         225  11/11/19 226 lb (102.5 kg)  11/04/19 226 lb (102.5 kg)  11/03/19 224 lb 1.9 oz (101.7 kg)    Vital [name] reviewed  05/24/2021  - Note at rest 02 sats  95% on RA   General appearance:    Cantankerous but now  ambulatory wm nad/ very difficult historian, getting frustrated with wife interjecting    HEENT : pt wearing mask not removed for exam due to covid -19 concerns.    NECK :  without JVD/Nodes/TM/ nl carotid upstrokes bilaterally   LUNGS: no acc muscle use,  Nl contour chest with minimal crackles in bases bilaterally without cough on insp or exp maneuvers   CV:  RRR  no s3 or murmur or increase in P2, and no edema   ABD:  soft and nontender with nl inspiratory excursion in the supine position. No bruits or organomegaly appreciated, bowel sounds nl  MS:  Nl gait/ ext warm without deformities, calf tenderness, cyanosis or clubbing No obvious joint restrictions   SKIN: warm and dry without lesions    NEURO:  alert, approp, nl sensorium with  no motor or cerebellar deficits apparent.         I personally reviewed images and agree with radiology impression as follows:   Chest HRCT   04/29/20 vs pri covid non HRCT chest  11/10/2019  Diffuse bronchial wall thickening with mild to moderate centrilobular and paraseptal emphysema. In addition, high-resolution images demonstrate widespread but patchy areas of ground-glass attenuation, septal thickening, mild subpleural reticulation, traction bronchiectasis and peripheral bronchiolectasis. These findings have a definitive craniocaudal gradient. No frank honeycombing. Inspiratory and expiratory imaging demonstrates some mild air trapping indicative of small airways disease. No acute consolidative airspace disease. No pleural effusions. Calcified pleural plaques are noted in the left hemithorax. Several noncalcified pleural plaques  are also noted in the right hemithorax. Imp: 1. 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 2. 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 Noncalcified pleural plaques in the right hemithorax. 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              Assessment

## 2021-05-24 NOTE — Assessment & Plan Note (Addendum)
Exposure to [employer] / quit smoking 2013  - see HRCT  04/29/20 in pt with bilateral pleural plaques and UIP changes  No specific rx needed, 02 supplementation prn (not qualifying at present)   Each maintenance medication was reviewed in detail including emphasizing most importantly the difference between maintenance and prns and under what circumstances the prns are to be triggered using an action plan format where appropriate.  Total time for H and P, chart review, counseling,  directly observing portions of ambulatory 02 saturation study/ and generating customized AVS unique to this office visit / same day charting = 36 min

## 2021-05-24 NOTE — Assessment & Plan Note (Signed)
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

## 2021-05-24 NOTE — Patient Instructions (Addendum)
I very strongly recommend you get the moderna or pfizer vaccine as soon as possible based on your risk of dying from the virus  and the proven safety and benefit of these vaccines against even the delta and omicron variants.  This can save your life as well as  those of your loved ones,  especially if they are also not vaccinated.     To get the most out of exercise, you need to be continuously aware that you are short of breath, but never out of breath, for at least 30 minutes daily. As you improve, it will actually be easier for you to do the same amount of exercise  in  30 minutes so always push to the level where you are short of breath.    Make sure you check your oxygen saturations at highest level of activity   Change the appointment  To 3 months with cxr on return

## 2021-06-22 ENCOUNTER — Ambulatory Visit: Payer: Medicare Other | Admitting: Internal Medicine

## 2021-08-23 ENCOUNTER — Other Ambulatory Visit: Payer: Self-pay | Admitting: Osteopathic Medicine

## 2021-08-24 DIAGNOSIS — I10 Essential (primary) hypertension: Secondary | ICD-10-CM | POA: Diagnosis not present

## 2021-08-24 DIAGNOSIS — I2510 Atherosclerotic heart disease of native coronary artery without angina pectoris: Secondary | ICD-10-CM | POA: Diagnosis not present

## 2021-08-24 DIAGNOSIS — Z8673 Personal history of transient ischemic attack (TIA), and cerebral infarction without residual deficits: Secondary | ICD-10-CM | POA: Diagnosis not present

## 2021-08-24 DIAGNOSIS — I480 Paroxysmal atrial fibrillation: Secondary | ICD-10-CM | POA: Diagnosis not present

## 2021-08-26 ENCOUNTER — Ambulatory Visit: Payer: Medicare Other | Admitting: Internal Medicine

## 2021-09-12 DIAGNOSIS — I480 Paroxysmal atrial fibrillation: Secondary | ICD-10-CM | POA: Diagnosis not present

## 2021-09-12 DIAGNOSIS — Z8616 Personal history of COVID-19: Secondary | ICD-10-CM | POA: Diagnosis not present

## 2021-09-12 DIAGNOSIS — I2510 Atherosclerotic heart disease of native coronary artery without angina pectoris: Secondary | ICD-10-CM | POA: Diagnosis not present

## 2021-09-12 DIAGNOSIS — J438 Other emphysema: Secondary | ICD-10-CM | POA: Diagnosis not present

## 2021-09-12 DIAGNOSIS — N1832 Chronic kidney disease, stage 3b: Secondary | ICD-10-CM | POA: Diagnosis not present

## 2021-09-12 DIAGNOSIS — I110 Hypertensive heart disease with heart failure: Secondary | ICD-10-CM | POA: Diagnosis not present

## 2021-09-12 DIAGNOSIS — E1122 Type 2 diabetes mellitus with diabetic chronic kidney disease: Secondary | ICD-10-CM | POA: Diagnosis not present

## 2021-09-12 DIAGNOSIS — F17200 Nicotine dependence, unspecified, uncomplicated: Secondary | ICD-10-CM | POA: Diagnosis not present

## 2021-09-12 DIAGNOSIS — I63231 Cerebral infarction due to unspecified occlusion or stenosis of right carotid arteries: Secondary | ICD-10-CM | POA: Diagnosis not present

## 2021-09-12 DIAGNOSIS — E785 Hyperlipidemia, unspecified: Secondary | ICD-10-CM | POA: Diagnosis not present

## 2021-09-12 DIAGNOSIS — I509 Heart failure, unspecified: Secondary | ICD-10-CM | POA: Diagnosis not present

## 2021-09-13 ENCOUNTER — Other Ambulatory Visit: Payer: Self-pay | Admitting: Internal Medicine

## 2021-09-13 DIAGNOSIS — J90 Pleural effusion, not elsewhere classified: Secondary | ICD-10-CM

## 2021-09-14 ENCOUNTER — Ambulatory Visit (INDEPENDENT_AMBULATORY_CARE_PROVIDER_SITE_OTHER): Payer: Medicare Other | Admitting: Internal Medicine

## 2021-09-14 ENCOUNTER — Other Ambulatory Visit: Payer: Self-pay

## 2021-09-14 ENCOUNTER — Ambulatory Visit (INDEPENDENT_AMBULATORY_CARE_PROVIDER_SITE_OTHER): Payer: Medicare Other

## 2021-09-14 ENCOUNTER — Encounter: Payer: Self-pay | Admitting: Internal Medicine

## 2021-09-14 VITALS — BP 128/68 | HR 75 | Temp 98.0°F | Ht 74.0 in | Wt 211.4 lb

## 2021-09-14 DIAGNOSIS — Z23 Encounter for immunization: Secondary | ICD-10-CM

## 2021-09-14 DIAGNOSIS — J9611 Chronic respiratory failure with hypoxia: Secondary | ICD-10-CM

## 2021-09-14 DIAGNOSIS — J849 Interstitial pulmonary disease, unspecified: Secondary | ICD-10-CM | POA: Diagnosis not present

## 2021-09-14 DIAGNOSIS — J302 Other seasonal allergic rhinitis: Secondary | ICD-10-CM

## 2021-09-14 DIAGNOSIS — J90 Pleural effusion, not elsewhere classified: Secondary | ICD-10-CM | POA: Diagnosis not present

## 2021-09-14 DIAGNOSIS — I700 Atherosclerosis of aorta: Secondary | ICD-10-CM | POA: Diagnosis not present

## 2021-09-14 IMAGING — DX DG CHEST 2V
2 series · 2 of 2 positions shown · non-contrast
Comparison: Chest x-ray dated December 23, 2019.

CLINICAL DATA: Shortness of breath.  COVID positive.

EXAM:
CHEST - 2 VIEW

[chest pa]
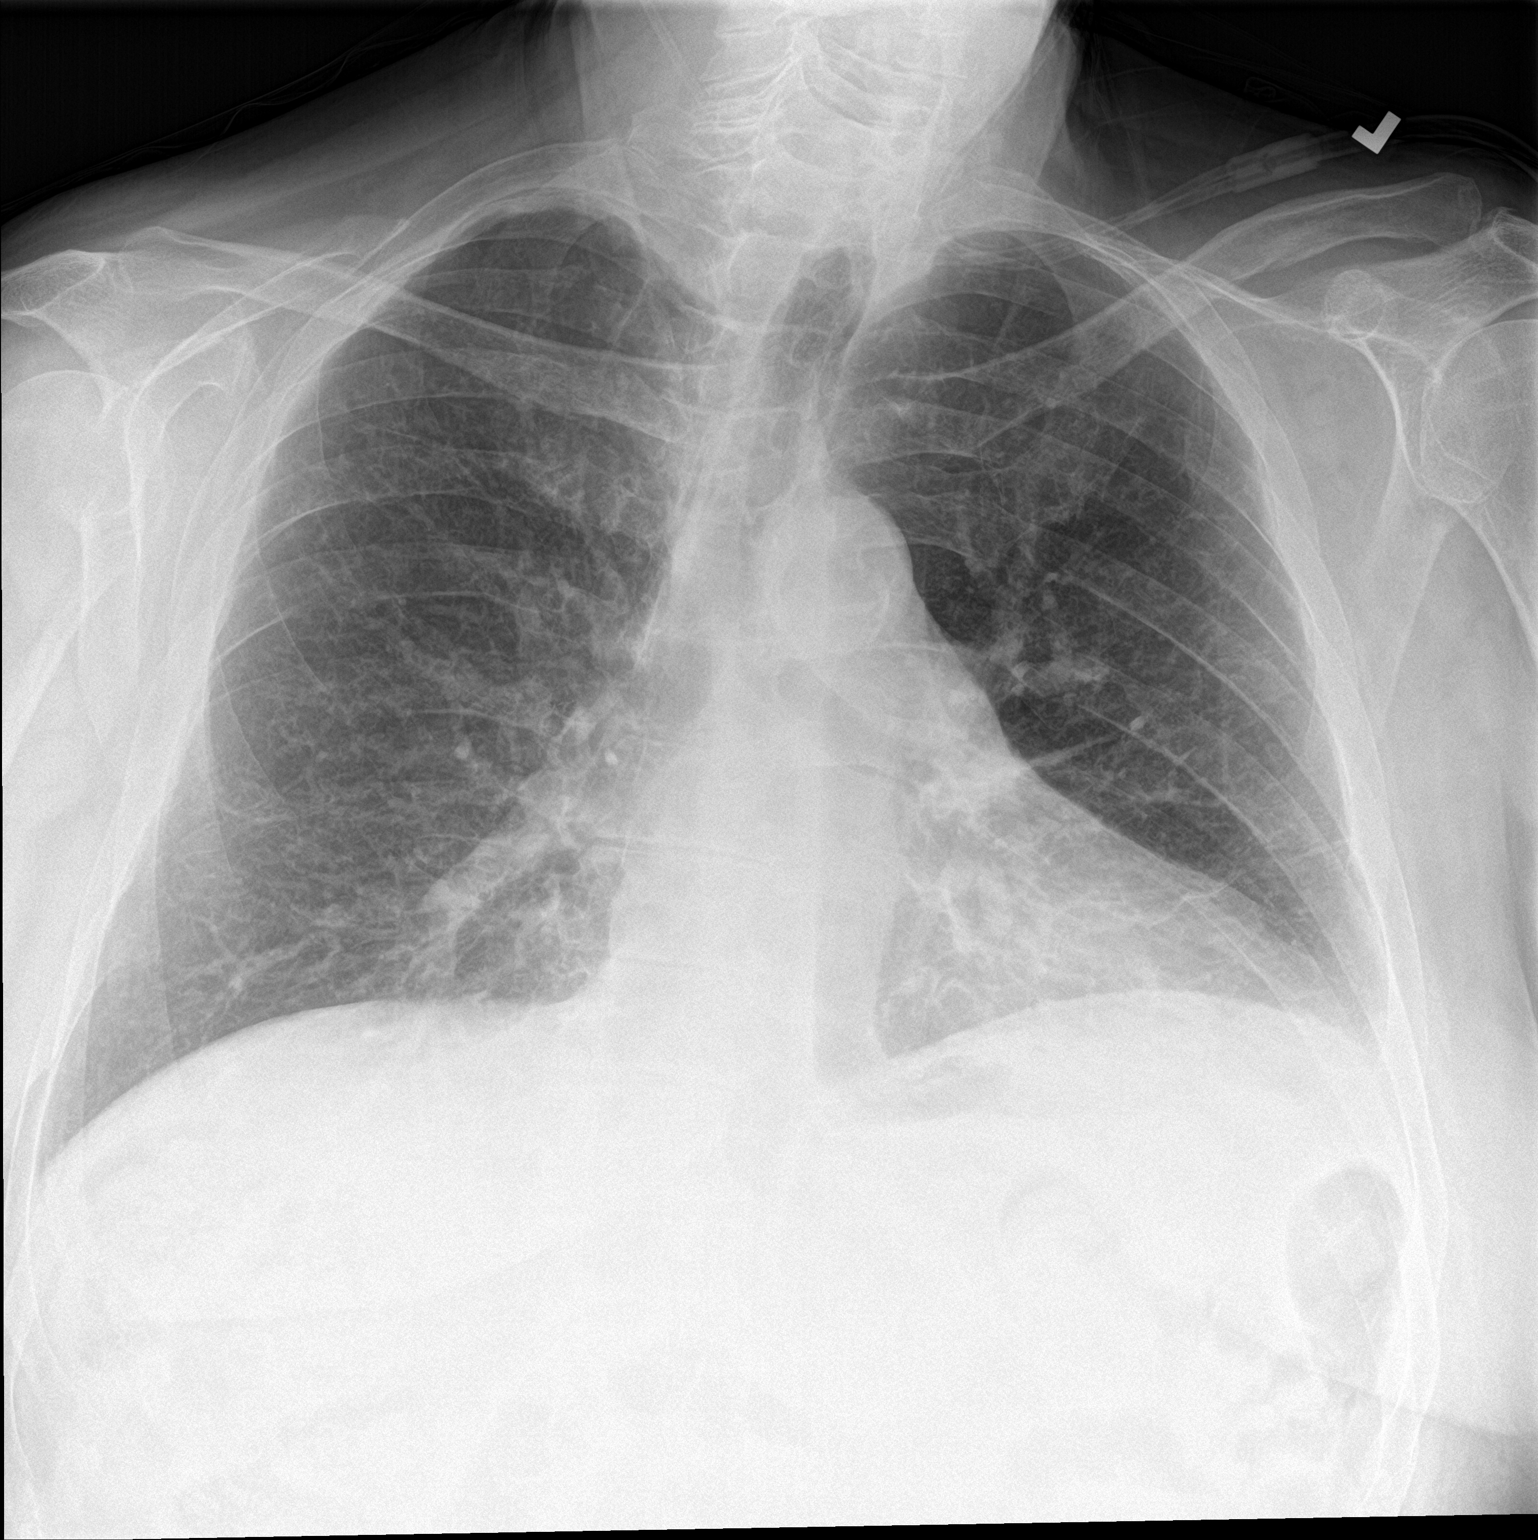

[chest lat]
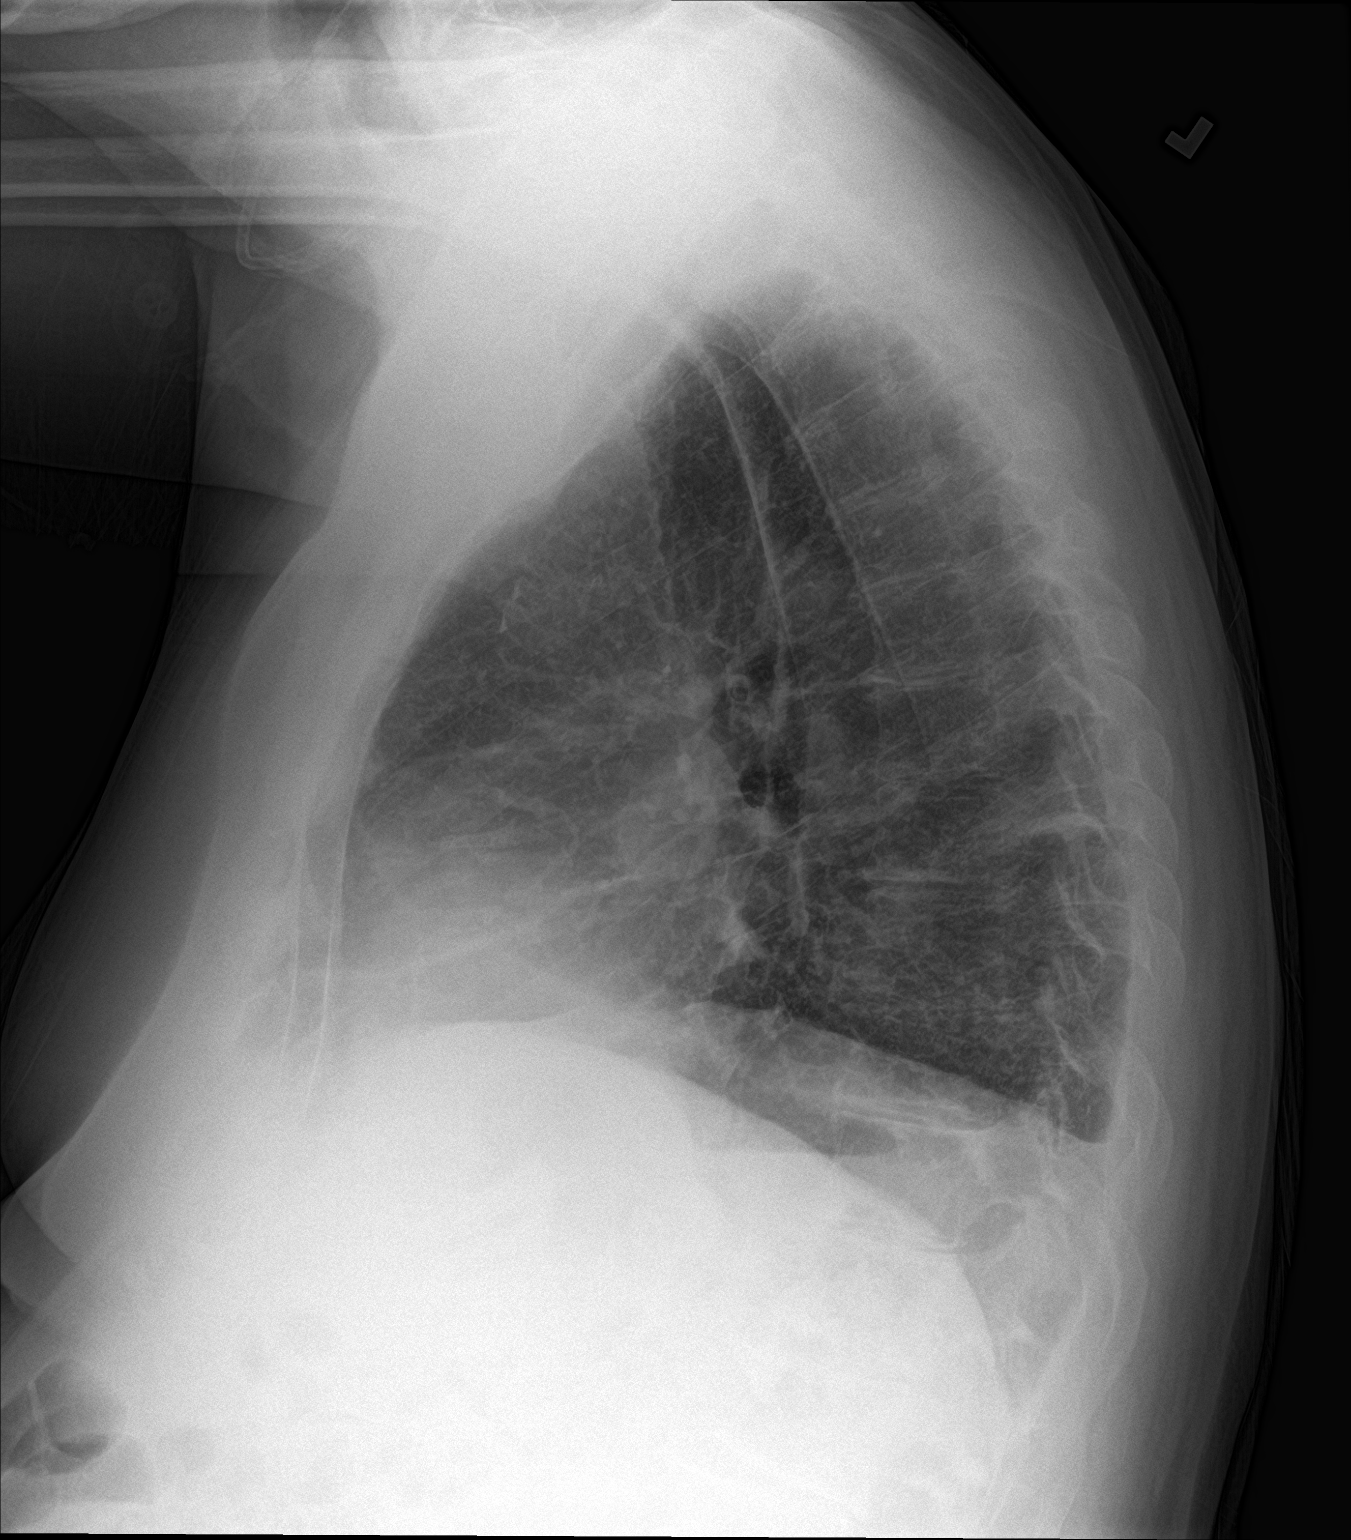

[2 of 2 positions shown; findings below may reference images not displayed]

FINDINGS: The heart size and mediastinal contours are within normal limits.
Normal pulmonary vascularity. Chronic small left pleural effusion,
unchanged. Mild left basilar atelectasis/scarring. No consolidation
or pneumothorax. No acute osseous abnormality.
IMPRESSION: 1.  No active cardiopulmonary disease.
2. Chronic small left pleural effusion.

## 2021-09-14 MED ORDER — ALBUTEROL SULFATE HFA 108 (90 BASE) MCG/ACT IN AERS: 1.0000 | INHALATION_SPRAY | RESPIRATORY_TRACT | 1 refills | Status: AC | PRN

## 2021-09-14 NOTE — Progress Notes (Signed)
Timothy [name], male    DOB: [birth year]    MRN: [id number]   Brief patient profile:  71 yowm retired [employer] Timothy [name] [place] quit smoking 2013 and vaping about mid 09/2019 with dry cough  Since 2005 while on ACEi  New onset L ant and lower post  Chest pain mid  oct 2020 chills and low grade  admitted 10/29/2019 and dx L Pl effusion with pleural plaque L base on CT chest  >>  thoracentesis 10/30/2019 x 400 cc serous fluid exudative with Polys > Lymphs and inflammatory cytology so rx'd as parapneumonic and self referred to pulmonary clinic 11/04/2019      History of Present Illness  11/04/2019  Pulmonary/ 1st office eval/Timothy [name]  Chief Complaint  Patient presents with   Pulmonary Consult    Self referral for pleural effusion. He c/o SOB and non prod cough for the past month. He is using an albuterol inhaler 2-3 x per day  Dyspnea:  MMRC3 = can't walk 100 yards even at a slow pace at a flat grade s stopping due to sob   Cough: dry daytime and also night x years  Sleep: Has not been able to lie flat in bed back pain x 40 y SABA use: new start, does not help  L chest pain with cough is better as is sob p tcentesis Rec: Re tap and do ct p tap > done 11/10/23 x 5 cc only with glucose up to 75 and no evidence of lung ca   11/11/2019  f/u ov/Timothy [name] re:  L loculated pleural effusion  Chief Complaint  Patient presents with   Follow-up    Discuss results of ct chest. Breathing is unchanged since the last visit.   Dyspnea:  = MMRC3 = can't walk 100 yards even at a slow pace at a flat grade s stopping due to sob   Cough: not productive at all  Sleeping: baselin 45 degrees x 40 years, now ever since oct 2020  having to sit upright SABA use:  Starts working p about 10 min helps chest congestion lasts for hours   02: no  Recurrent  R foot swelling x  Years no change  Having daily chills since oct 2021 set Temp up 78 to compensate which usually he leaves at 70 Skin test positive in [place]   1990s no sputum available but told it was  "false positive"  rec Only use your albuterol as a rescue medication to be used if you can't catch your breath by resting or you have chest heaviness  - The less you use it, the better it will work when you need it. - Ok to use up to 2 puffs  every 4 hours if you must but call for immediate appointment if use goes up over your usual need - Don't leave home without it !!  (think of it like the spare tire for your car)  Thoracic surgery Dr. Roxan Hockey in Dec 2020, underwent drainage of effusion and decortication on 11/26/19. No evidence of malignancy, felt likely to be postpneumonic effusion.     11/26/2019 Decortication Dr Roxan Hockey > no clear evidence of asbestos involvement or CA 1.  Video bronchoscopy. 2.  Left video-assisted thoracoscopy. 3.  Drainage of pleural effusion.   4.  Visceral and parietal pleural decortication. 5.  Intercostal nerve block.   Admitted with covid 19 pna  10/09/20 p onset of symtoms 09/30/20   12/23/2020  f/u ov/Timothy [name] re:  S/p decortication on  L  11/26/2019  Chief Complaint  Patient presents with   Follow-up    Breathing has improved some since his last visit. He has occ cough at night. He is using his albuterol inhaler 3 x per day on average.   Dyspnea: more limited by energy than breathing  Cough: hs p lie down / non productive trend is better  Sleeping: hosp bed @ in 45 degrees SABA use: as above  02: 3-4 lpm sats running low 90s  rec Goal is to keep your 02 sats above 90% no higher needed No change in medications. Please remember to go to the  x-ray department  for your tests - we will call you with the results when they are available.  I very strongly recommend you get the moderna or pfizer vaccine as soon as possible based on your risk of dying from the virus  and the proven safety and benefit of these vaccines against even the omicron variants.         05/24/2021  f/u ov/Timothy [name] re: post covid ild/ no longer  02 dep  Chief Complaint  Patient presents with   Follow-up    Here to discuss CT Chest results. Pt states his breathing is doing well. He has some throat clearing at night. He rarely uses his albuterol inhaler.   Dyspnea:  100 ft to mailbox slt downhill he says due to calves hurting / wife he's laboring to breathe  Cough: some sense of drainage at hs but not producing anything  Sleeping: on back hosp bed maybe 45 degrees (f anything it's lower than previous) SABA use: rarely  02: none at all  Covid status:   Never vaccinated, probably had delta Rec To get the most out of exercise, you need to be continuously aware that you are short of breath, but never out of breath, for at least 30 minutes daily.  Make sure you check your oxygen saturations at highest level of activity    09/14/2021  f/u ov/Timothy [name] re: post covid ild    maint on singulair  / turned 02 back in  No chief complaint on file.  Dyspnea:  no trouble mb and back / limited some by back pain  Cough: 3-4 am coughing nightly p taking singulair in am  Sleeping: cough noct better sitting up  SABA use: none - lost rx  02: none  Covid status:   never    No obvious day to day or daytime variability or assoc excess/ purulent sputum or mucus plugs or hemoptysis or cp or chest tightness, subjective wheeze or overt sinus or hb symptoms.   Sleeping  without nocturnal  or early am exacerbation  of respiratory  c/o's or need for noct saba. Also denies any obvious fluctuation of symptoms with weather or environmental changes or other aggravating or alleviating factors except as outlined above   No unusual exposure hx or h/o childhood pna/ asthma or knowledge of premature birth.  Current Allergies, Complete Past Medical History, Past Surgical History, Family History, and Social History were reviewed in [facility] record.  ROS  The following are not active complaints unless bolded Hoarseness, sore throat, dysphagia,  dental problems, itching, sneezing,  nasal congestion or discharge of excess mucus or purulent secretions, ear ache,   fever, chills, sweats, unintended wt loss or wt gain, classically pleuritic or exertional cp,  orthopnea pnd or arm/hand swelling  or leg swelling, presyncope, palpitations, abdominal pain, anorexia, nausea, vomiting, diarrhea  or change  in bowel habits or change in bladder habits, change in stools or change in urine, dysuria, hematuria,  rash, arthralgias, visual complaints, headache, numbness, weakness or ataxia or problems with walking or coordination,  change in mood or  memory.        Current Meds  Medication Sig   Black Pepper-Turmeric (TURMERIC CURCUMIN) 04-999 MG CAPS Take 2 capsules by mouth daily.   Calcium-Phosphorus-Vitamin D (CALCIUM GUMMIES PO) Take by mouth daily.   Cyanocobalamin (VITAMIN B 12 PO) Take by mouth. 2500 mcg two  sublingual tabs   ELIQUIS 5 MG TABS tablet TAKE 1 TABLET TWICE A DAY   metoprolol succinate (TOPROL-XL) 25 MG 24 hr tablet TAKE ONE-HALF (1/2) TABLET DAILY   montelukast (SINGULAIR) 10 MG tablet TAKE 1 TABLET DAILY   Multiple Vitamins-Minerals (AIRBORNE PO) Take 1 tablet by mouth at bedtime.    Omega-3 Fatty Acids (FISH OIL PO) Take 1 capsule by mouth in the morning and at bedtime.   telmisartan (MICARDIS) 20 MG tablet Take 20 mg by mouth daily.   UNABLE TO FIND Med Name: sonavel for tendonitis   UNABLE TO FIND once. Med Name: Eye vitamin, pressure vision (2 tabs daily)               Past Medical History:  Diagnosis Date   Arthritis 09/08/2015   CAD (coronary artery disease) 09/07/2015   S/P RCA stent x2, Hx MI, (+)ACEI, Statin, BB, ASA - 10/2012    Essential hypertension 09/07/2015   History of kidney stones 09/20/2015   11/2013 42mm stone   Hyperlipidemia 09/07/2015   Osteopenia 10/29/2015   FRAX Score: 10 year risk of major osteoporotic fracture 8.9%, hip fracture 3.2%, prescription treatment is indicated, called in VitD/Ca, +/-  bisphosphanate    Screening for AAA (aortic abdominal aneurysm) 09/20/2015   Neg 06/11/15 per record review   Seasonal allergies 09/07/2015       Objective:     09/14/2021      211  05/24/2021         212  12/23/2020         225  11/11/19 226 lb (102.5 kg)  11/04/19 226 lb (102.5 kg)  11/03/19 224 lb 1.9 oz (101.7 kg)     Vital signs reviewed  09/14/2021  - Note at rest 02 sats  95% on RA   General appearance:    amb wm nad   HEENT : pt wearing mask not removed for exam due to covid -19 concerns.    NECK :  without JVD/Nodes/TM/ nl carotid upstrokes bilaterally   LUNGS: no acc muscle use,  Nl contour chest with a few crackles in bases  bilaterally without cough on insp or exp maneuvers   CV:  RRR  no s3 or murmur or increase in P2, and no edema   ABD:  soft and nontender with nl inspiratory excursion in the supine position. No bruits or organomegaly appreciated, bowel sounds nl  MS:  Nl gait/ ext warm without deformities, calf tenderness, cyanosis or clubbing No obvious joint restrictions   SKIN: warm and dry without lesions    NEURO:  alert, approp, nl sensorium with  no motor or cerebellar deficits apparent.     CXR PA and Lateral:   09/14/2021 :    I personally reviewed images and agree with radiology impression as follows:       Chronic changes of interstitial lung disease better demonstrated on prior high-resolution chest CT 04/29/2021,  Assessment

## 2021-09-14 NOTE — Patient Instructions (Addendum)
Change singulair to where you take it in evening   Make sure you check your oxygen saturation at your highest level of activity to be sure it stays over 90% and keep track of it at least once a week, more often if breathing getting worse, and let me know if losing ground.   Pulmonary follow up is as needed

## 2021-09-15 DIAGNOSIS — I6523 Occlusion and stenosis of bilateral carotid arteries: Secondary | ICD-10-CM | POA: Diagnosis not present

## 2021-09-15 DIAGNOSIS — I6521 Occlusion and stenosis of right carotid artery: Secondary | ICD-10-CM | POA: Diagnosis not present

## 2021-09-16 ENCOUNTER — Encounter: Payer: Self-pay | Admitting: Internal Medicine

## 2021-09-16 DIAGNOSIS — I63231 Cerebral infarction due to unspecified occlusion or stenosis of right carotid arteries: Secondary | ICD-10-CM | POA: Diagnosis not present

## 2021-09-16 DIAGNOSIS — E1122 Type 2 diabetes mellitus with diabetic chronic kidney disease: Secondary | ICD-10-CM | POA: Diagnosis not present

## 2021-09-16 DIAGNOSIS — I509 Heart failure, unspecified: Secondary | ICD-10-CM | POA: Diagnosis not present

## 2021-09-16 DIAGNOSIS — I110 Hypertensive heart disease with heart failure: Secondary | ICD-10-CM | POA: Diagnosis not present

## 2021-09-16 DIAGNOSIS — I2510 Atherosclerotic heart disease of native coronary artery without angina pectoris: Secondary | ICD-10-CM | POA: Diagnosis not present

## 2021-09-16 DIAGNOSIS — I480 Paroxysmal atrial fibrillation: Secondary | ICD-10-CM | POA: Diagnosis not present

## 2021-09-16 NOTE — Assessment & Plan Note (Signed)
Assoc with pnds esp hs so rec first try singulair 10 mg q hs and allergy f/u p      Each maintenance medication was reviewed in detail including emphasizing most importantly the difference between maintenance and prns and under what circumstances the prns are to be triggered using an action plan format where appropriate.  Total time for H and P, chart review, counseling,  and generating customized AVS unique to this office visit / same day charting =  25 min

## 2021-09-16 NOTE — Assessment & Plan Note (Signed)
Admitted with covid 19 pna  10/09/20 p onset of symtoms 09/30/20  12/23/20 Patient Saturations on Room Air at Rest = 80% Patient Saturations on 6 Liters of oxygen while Ambulating = 96% -  05/24/2021   Walked RA  3 laps @ approx 265ft each @ moderate pace  stopped due to end of study, min sob lowest sats 91%   Advised: Make sure you check your oxygen saturation at your highest level of activity to be sure it stays over 90% and keep track of it at least once a week, more often if breathing getting worse, and let me know if losing ground.    Marland Kitchen

## 2021-09-16 NOTE — Assessment & Plan Note (Signed)
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 Roxan Hockey in Dec 2020, underwent drainage of effusion and decortication on 11/26/19. No evidence of malignancy, felt likely to be postpneumonic effusion.  - cxr 12/23/2020 s significant reaccumulation > same on cxr 09/14/2021   Pulmonary f/u is prn

## 2021-09-20 DIAGNOSIS — E1122 Type 2 diabetes mellitus with diabetic chronic kidney disease: Secondary | ICD-10-CM | POA: Diagnosis not present

## 2021-09-20 DIAGNOSIS — I2510 Atherosclerotic heart disease of native coronary artery without angina pectoris: Secondary | ICD-10-CM | POA: Diagnosis not present

## 2021-09-20 DIAGNOSIS — I509 Heart failure, unspecified: Secondary | ICD-10-CM | POA: Diagnosis not present

## 2021-09-20 DIAGNOSIS — I110 Hypertensive heart disease with heart failure: Secondary | ICD-10-CM | POA: Diagnosis not present

## 2021-09-20 DIAGNOSIS — I63231 Cerebral infarction due to unspecified occlusion or stenosis of right carotid arteries: Secondary | ICD-10-CM | POA: Diagnosis not present

## 2021-09-20 DIAGNOSIS — I480 Paroxysmal atrial fibrillation: Secondary | ICD-10-CM | POA: Diagnosis not present

## 2021-09-22 DIAGNOSIS — I2510 Atherosclerotic heart disease of native coronary artery without angina pectoris: Secondary | ICD-10-CM | POA: Diagnosis not present

## 2021-09-22 DIAGNOSIS — I509 Heart failure, unspecified: Secondary | ICD-10-CM | POA: Diagnosis not present

## 2021-09-22 DIAGNOSIS — E1122 Type 2 diabetes mellitus with diabetic chronic kidney disease: Secondary | ICD-10-CM | POA: Diagnosis not present

## 2021-09-22 DIAGNOSIS — I480 Paroxysmal atrial fibrillation: Secondary | ICD-10-CM | POA: Diagnosis not present

## 2021-09-22 DIAGNOSIS — I110 Hypertensive heart disease with heart failure: Secondary | ICD-10-CM | POA: Diagnosis not present

## 2021-09-22 DIAGNOSIS — I63231 Cerebral infarction due to unspecified occlusion or stenosis of right carotid arteries: Secondary | ICD-10-CM | POA: Diagnosis not present

## 2021-09-23 DIAGNOSIS — I480 Paroxysmal atrial fibrillation: Secondary | ICD-10-CM | POA: Diagnosis not present

## 2021-09-23 DIAGNOSIS — I2510 Atherosclerotic heart disease of native coronary artery without angina pectoris: Secondary | ICD-10-CM | POA: Diagnosis not present

## 2021-09-23 DIAGNOSIS — I63231 Cerebral infarction due to unspecified occlusion or stenosis of right carotid arteries: Secondary | ICD-10-CM | POA: Diagnosis not present

## 2021-09-23 DIAGNOSIS — I110 Hypertensive heart disease with heart failure: Secondary | ICD-10-CM | POA: Diagnosis not present

## 2021-09-23 DIAGNOSIS — I509 Heart failure, unspecified: Secondary | ICD-10-CM | POA: Diagnosis not present

## 2021-09-23 DIAGNOSIS — E1122 Type 2 diabetes mellitus with diabetic chronic kidney disease: Secondary | ICD-10-CM | POA: Diagnosis not present

## 2021-09-26 DIAGNOSIS — I509 Heart failure, unspecified: Secondary | ICD-10-CM | POA: Diagnosis not present

## 2021-09-26 DIAGNOSIS — I110 Hypertensive heart disease with heart failure: Secondary | ICD-10-CM | POA: Diagnosis not present

## 2021-09-26 DIAGNOSIS — I2510 Atherosclerotic heart disease of native coronary artery without angina pectoris: Secondary | ICD-10-CM | POA: Diagnosis not present

## 2021-09-26 DIAGNOSIS — E1122 Type 2 diabetes mellitus with diabetic chronic kidney disease: Secondary | ICD-10-CM | POA: Diagnosis not present

## 2021-09-26 DIAGNOSIS — I480 Paroxysmal atrial fibrillation: Secondary | ICD-10-CM | POA: Diagnosis not present

## 2021-09-26 DIAGNOSIS — I63231 Cerebral infarction due to unspecified occlusion or stenosis of right carotid arteries: Secondary | ICD-10-CM | POA: Diagnosis not present

## 2021-09-30 DIAGNOSIS — I509 Heart failure, unspecified: Secondary | ICD-10-CM | POA: Diagnosis not present

## 2021-09-30 DIAGNOSIS — I63231 Cerebral infarction due to unspecified occlusion or stenosis of right carotid arteries: Secondary | ICD-10-CM | POA: Diagnosis not present

## 2021-09-30 DIAGNOSIS — I2510 Atherosclerotic heart disease of native coronary artery without angina pectoris: Secondary | ICD-10-CM | POA: Diagnosis not present

## 2021-09-30 DIAGNOSIS — I110 Hypertensive heart disease with heart failure: Secondary | ICD-10-CM | POA: Diagnosis not present

## 2021-09-30 DIAGNOSIS — I480 Paroxysmal atrial fibrillation: Secondary | ICD-10-CM | POA: Diagnosis not present

## 2021-09-30 DIAGNOSIS — E1122 Type 2 diabetes mellitus with diabetic chronic kidney disease: Secondary | ICD-10-CM | POA: Diagnosis not present

## 2021-10-04 DIAGNOSIS — I2510 Atherosclerotic heart disease of native coronary artery without angina pectoris: Secondary | ICD-10-CM | POA: Diagnosis not present

## 2021-10-04 DIAGNOSIS — E1122 Type 2 diabetes mellitus with diabetic chronic kidney disease: Secondary | ICD-10-CM | POA: Diagnosis not present

## 2021-10-04 DIAGNOSIS — I509 Heart failure, unspecified: Secondary | ICD-10-CM | POA: Diagnosis not present

## 2021-10-04 DIAGNOSIS — I110 Hypertensive heart disease with heart failure: Secondary | ICD-10-CM | POA: Diagnosis not present

## 2021-10-04 DIAGNOSIS — I63231 Cerebral infarction due to unspecified occlusion or stenosis of right carotid arteries: Secondary | ICD-10-CM | POA: Diagnosis not present

## 2021-10-04 DIAGNOSIS — I480 Paroxysmal atrial fibrillation: Secondary | ICD-10-CM | POA: Diagnosis not present

## 2021-10-09 ENCOUNTER — Other Ambulatory Visit: Payer: Self-pay | Admitting: Osteopathic Medicine

## 2021-10-10 DIAGNOSIS — I480 Paroxysmal atrial fibrillation: Secondary | ICD-10-CM | POA: Diagnosis not present

## 2021-10-10 DIAGNOSIS — I110 Hypertensive heart disease with heart failure: Secondary | ICD-10-CM | POA: Diagnosis not present

## 2021-10-10 DIAGNOSIS — E1122 Type 2 diabetes mellitus with diabetic chronic kidney disease: Secondary | ICD-10-CM | POA: Diagnosis not present

## 2021-10-10 DIAGNOSIS — I509 Heart failure, unspecified: Secondary | ICD-10-CM | POA: Diagnosis not present

## 2021-10-10 DIAGNOSIS — I63231 Cerebral infarction due to unspecified occlusion or stenosis of right carotid arteries: Secondary | ICD-10-CM | POA: Diagnosis not present

## 2021-10-10 DIAGNOSIS — I2510 Atherosclerotic heart disease of native coronary artery without angina pectoris: Secondary | ICD-10-CM | POA: Diagnosis not present

## 2021-10-19 ENCOUNTER — Other Ambulatory Visit: Payer: Self-pay | Admitting: Osteopathic Medicine

## 2021-10-19 DIAGNOSIS — J302 Other seasonal allergic rhinitis: Secondary | ICD-10-CM

## 2021-10-28 DIAGNOSIS — I10 Essential (primary) hypertension: Secondary | ICD-10-CM | POA: Diagnosis not present

## 2021-10-28 DIAGNOSIS — I480 Paroxysmal atrial fibrillation: Secondary | ICD-10-CM | POA: Diagnosis not present

## 2021-10-28 DIAGNOSIS — I2510 Atherosclerotic heart disease of native coronary artery without angina pectoris: Secondary | ICD-10-CM | POA: Diagnosis not present

## 2021-10-28 DIAGNOSIS — I252 Old myocardial infarction: Secondary | ICD-10-CM | POA: Diagnosis not present

## 2021-10-28 DIAGNOSIS — Z955 Presence of coronary angioplasty implant and graft: Secondary | ICD-10-CM | POA: Diagnosis not present

## 2021-12-08 ENCOUNTER — Other Ambulatory Visit: Payer: Self-pay | Admitting: Osteopathic Medicine

## 2021-12-08 NOTE — Telephone Encounter (Signed)
Routing to covering provider. Rx not listed in active med list.

## 2021-12-26 ENCOUNTER — Other Ambulatory Visit: Payer: Self-pay | Admitting: Family Medicine

## 2021-12-26 DIAGNOSIS — J302 Other seasonal allergic rhinitis: Secondary | ICD-10-CM

## 2022-02-03 DIAGNOSIS — K068 Other specified disorders of gingiva and edentulous alveolar ridge: Secondary | ICD-10-CM | POA: Diagnosis not present

## 2022-02-03 DIAGNOSIS — Z87891 Personal history of nicotine dependence: Secondary | ICD-10-CM | POA: Diagnosis not present

## 2022-02-03 DIAGNOSIS — I10 Essential (primary) hypertension: Secondary | ICD-10-CM | POA: Diagnosis not present

## 2022-02-03 DIAGNOSIS — I4891 Unspecified atrial fibrillation: Secondary | ICD-10-CM | POA: Diagnosis not present

## 2022-02-03 DIAGNOSIS — R031 Nonspecific low blood-pressure reading: Secondary | ICD-10-CM | POA: Diagnosis not present

## 2022-02-03 DIAGNOSIS — Z7901 Long term (current) use of anticoagulants: Secondary | ICD-10-CM | POA: Diagnosis not present

## 2022-02-03 DIAGNOSIS — Z79899 Other long term (current) drug therapy: Secondary | ICD-10-CM | POA: Diagnosis not present

## 2022-02-03 DIAGNOSIS — Z8616 Personal history of COVID-19: Secondary | ICD-10-CM | POA: Diagnosis not present

## 2022-02-07 DIAGNOSIS — Z7901 Long term (current) use of anticoagulants: Secondary | ICD-10-CM | POA: Diagnosis not present

## 2022-02-07 DIAGNOSIS — S199XXA Unspecified injury of neck, initial encounter: Secondary | ICD-10-CM | POA: Diagnosis not present

## 2022-02-07 DIAGNOSIS — Z87891 Personal history of nicotine dependence: Secondary | ICD-10-CM | POA: Diagnosis not present

## 2022-02-07 DIAGNOSIS — Z8616 Personal history of COVID-19: Secondary | ICD-10-CM | POA: Diagnosis not present

## 2022-02-07 DIAGNOSIS — S0990XA Unspecified injury of head, initial encounter: Secondary | ICD-10-CM | POA: Diagnosis not present

## 2022-02-07 DIAGNOSIS — S0081XA Abrasion of other part of head, initial encounter: Secondary | ICD-10-CM | POA: Diagnosis not present

## 2022-02-07 DIAGNOSIS — U071 COVID-19: Secondary | ICD-10-CM | POA: Diagnosis not present

## 2022-02-07 DIAGNOSIS — Z79899 Other long term (current) drug therapy: Secondary | ICD-10-CM | POA: Diagnosis not present

## 2022-02-07 DIAGNOSIS — I10 Essential (primary) hypertension: Secondary | ICD-10-CM | POA: Diagnosis not present

## 2022-02-07 DIAGNOSIS — I4891 Unspecified atrial fibrillation: Secondary | ICD-10-CM | POA: Diagnosis not present

## 2022-02-21 DIAGNOSIS — I2510 Atherosclerotic heart disease of native coronary artery without angina pectoris: Secondary | ICD-10-CM | POA: Diagnosis not present

## 2022-02-21 DIAGNOSIS — Z8673 Personal history of transient ischemic attack (TIA), and cerebral infarction without residual deficits: Secondary | ICD-10-CM | POA: Diagnosis not present

## 2022-02-21 DIAGNOSIS — I10 Essential (primary) hypertension: Secondary | ICD-10-CM | POA: Diagnosis not present

## 2022-02-21 DIAGNOSIS — I480 Paroxysmal atrial fibrillation: Secondary | ICD-10-CM | POA: Diagnosis not present

## 2022-03-09 DIAGNOSIS — I6521 Occlusion and stenosis of right carotid artery: Secondary | ICD-10-CM | POA: Diagnosis not present

## 2022-03-25 DIAGNOSIS — N390 Urinary tract infection, site not specified: Secondary | ICD-10-CM | POA: Diagnosis not present

## 2022-03-25 DIAGNOSIS — R350 Frequency of micturition: Secondary | ICD-10-CM | POA: Diagnosis not present

## 2022-07-10 DIAGNOSIS — I252 Old myocardial infarction: Secondary | ICD-10-CM | POA: Diagnosis not present

## 2022-07-10 DIAGNOSIS — Z8673 Personal history of transient ischemic attack (TIA), and cerebral infarction without residual deficits: Secondary | ICD-10-CM | POA: Diagnosis not present

## 2022-07-10 DIAGNOSIS — I480 Paroxysmal atrial fibrillation: Secondary | ICD-10-CM | POA: Diagnosis not present

## 2022-07-27 DIAGNOSIS — G8929 Other chronic pain: Secondary | ICD-10-CM | POA: Diagnosis not present

## 2022-07-27 DIAGNOSIS — L918 Other hypertrophic disorders of the skin: Secondary | ICD-10-CM | POA: Diagnosis not present

## 2022-07-27 DIAGNOSIS — L821 Other seborrheic keratosis: Secondary | ICD-10-CM | POA: Diagnosis not present

## 2022-07-27 DIAGNOSIS — I639 Cerebral infarction, unspecified: Secondary | ICD-10-CM | POA: Diagnosis not present

## 2022-07-27 DIAGNOSIS — D229 Melanocytic nevi, unspecified: Secondary | ICD-10-CM | POA: Diagnosis not present

## 2022-07-27 DIAGNOSIS — I219 Acute myocardial infarction, unspecified: Secondary | ICD-10-CM | POA: Diagnosis not present

## 2022-08-21 ENCOUNTER — Other Ambulatory Visit: Payer: Self-pay | Admitting: Osteopathic Medicine

## 2022-10-05 ENCOUNTER — Other Ambulatory Visit: Payer: Self-pay | Admitting: Medical-Surgical

## 2022-12-21 ENCOUNTER — Other Ambulatory Visit: Payer: Self-pay | Admitting: Family Medicine

## 2022-12-21 DIAGNOSIS — J302 Other seasonal allergic rhinitis: Secondary | ICD-10-CM
